# Patient Record
Sex: Male | Born: 1965 | Race: Black or African American | Hispanic: No | Marital: Single | Smoking: Former smoker
Health system: Southern US, Community
[De-identification: ages and names within clinical notes are randomized; demographics above are authoritative.]

## PROBLEM LIST (undated history)

## (undated) DIAGNOSIS — Z87898 Personal history of other specified conditions: Secondary | ICD-10-CM

## (undated) DIAGNOSIS — F1290 Cannabis use, unspecified, uncomplicated: Secondary | ICD-10-CM

## (undated) DIAGNOSIS — F4310 Post-traumatic stress disorder, unspecified: Secondary | ICD-10-CM

## (undated) DIAGNOSIS — N529 Male erectile dysfunction, unspecified: Secondary | ICD-10-CM

## (undated) DIAGNOSIS — F1490 Cocaine use, unspecified, uncomplicated: Secondary | ICD-10-CM

## (undated) DIAGNOSIS — E559 Vitamin D deficiency, unspecified: Secondary | ICD-10-CM

## (undated) DIAGNOSIS — G4700 Insomnia, unspecified: Secondary | ICD-10-CM

## (undated) DIAGNOSIS — J189 Pneumonia, unspecified organism: Secondary | ICD-10-CM

## (undated) DIAGNOSIS — N289 Disorder of kidney and ureter, unspecified: Secondary | ICD-10-CM

## (undated) DIAGNOSIS — I10 Essential (primary) hypertension: Secondary | ICD-10-CM

## (undated) DIAGNOSIS — J45909 Unspecified asthma, uncomplicated: Secondary | ICD-10-CM

## (undated) DIAGNOSIS — G4733 Obstructive sleep apnea (adult) (pediatric): Secondary | ICD-10-CM

## (undated) DIAGNOSIS — F32A Depression, unspecified: Secondary | ICD-10-CM

## (undated) DIAGNOSIS — F1291 Cannabis use, unspecified, in remission: Secondary | ICD-10-CM

## (undated) DIAGNOSIS — I509 Heart failure, unspecified: Secondary | ICD-10-CM

## (undated) DIAGNOSIS — F419 Anxiety disorder, unspecified: Secondary | ICD-10-CM

## (undated) DIAGNOSIS — D649 Anemia, unspecified: Secondary | ICD-10-CM

## (undated) HISTORY — DX: Cannabis use, unspecified, uncomplicated: F12.90

## (undated) HISTORY — DX: Obstructive sleep apnea (adult) (pediatric): G47.33

## (undated) HISTORY — DX: Vitamin D deficiency, unspecified: E55.9

## (undated) HISTORY — DX: Anemia, unspecified: D64.9

## (undated) HISTORY — DX: Cocaine use, unspecified, uncomplicated: F14.90

## (undated) HISTORY — DX: Male erectile dysfunction, unspecified: N52.9

## (undated) HISTORY — DX: Disorder of kidney and ureter, unspecified: N28.9

## (undated) HISTORY — DX: Personal history of other specified conditions: Z87.898

## (undated) HISTORY — PX: OTHER SURGICAL HISTORY: SHX169

## (undated) HISTORY — DX: Cannabis use, unspecified, in remission: F12.91

---

## 2013-08-11 ENCOUNTER — Emergency Department (HOSPITAL_COMMUNITY)
Admission: EM | Admit: 2013-08-11 | Discharge: 2013-08-11 | Disposition: A | Discharge status: Home / Self Care | Payer: BC Managed Care – PPO | Attending: Emergency Medicine | Admitting: Emergency Medicine

## 2013-08-11 ENCOUNTER — Emergency Department (HOSPITAL_COMMUNITY): Payer: BC Managed Care – PPO

## 2013-08-11 ENCOUNTER — Encounter (HOSPITAL_COMMUNITY): Payer: Self-pay | Admitting: Emergency Medicine

## 2013-08-11 DIAGNOSIS — Z79899 Other long term (current) drug therapy: Secondary | ICD-10-CM | POA: Insufficient documentation

## 2013-08-11 DIAGNOSIS — Z8659 Personal history of other mental and behavioral disorders: Secondary | ICD-10-CM | POA: Insufficient documentation

## 2013-08-11 DIAGNOSIS — N289 Disorder of kidney and ureter, unspecified: Secondary | ICD-10-CM | POA: Insufficient documentation

## 2013-08-11 DIAGNOSIS — R0789 Other chest pain: Secondary | ICD-10-CM

## 2013-08-11 DIAGNOSIS — I10 Essential (primary) hypertension: Secondary | ICD-10-CM | POA: Insufficient documentation

## 2013-08-11 HISTORY — DX: Essential (primary) hypertension: I10

## 2013-08-11 HISTORY — DX: Anxiety disorder, unspecified: F41.9

## 2013-08-11 LAB — CBC
HCT: 38.9 % — ABNORMAL LOW (ref 39.0–52.0)
Hemoglobin: 13.3 g/dL (ref 13.0–17.0)
MCH: 29.2 pg (ref 26.0–34.0)
MCHC: 34.2 g/dL (ref 30.0–36.0)
MCV: 85.5 fL (ref 78.0–100.0)
RBC: 4.55 MIL/uL (ref 4.22–5.81)

## 2013-08-11 LAB — BASIC METABOLIC PANEL
BUN: 21 mg/dL (ref 6–23)
CO2: 25 mEq/L (ref 19–32)
Calcium: 8.9 mg/dL (ref 8.4–10.5)
Creatinine, Ser: 1.87 mg/dL — ABNORMAL HIGH (ref 0.50–1.35)
GFR calc non Af Amer: 41 mL/min — ABNORMAL LOW (ref >90)
Glucose, Bld: 113 mg/dL — ABNORMAL HIGH (ref 70–99)
Sodium: 137 mEq/L (ref 135–145)

## 2013-08-11 LAB — POCT I-STAT TROPONIN I

## 2013-08-11 NOTE — ED Provider Notes (Signed)
CSN: [id number]     Arrival date & time 08/11/13  2033 History   First MD Initiated Contact with Patient 08/11/13 2118     Chief Complaint  Patient presents with  . Chest Pain   (Consider location/radiation/quality/duration/timing/severity/associated sxs/prior Treatment) Patient is a 47 y.o. male presenting with chest pain. The history is provided by the patient.  Chest Pain  Patient here complaining of chest pain that began yesterday while at rest. Pain last for approximately one to 2 seconds and is located on the left side of his chest and characterized as sharp. No radiation or associated dyspnea, diaphoresis, nausea, vomiting. No rashes to his chest. No recent fever or cough. No leg pain or swelling. No prior history of same. No symptoms today. He feels that his baseline. Denies any prior history of CAD. Past Medical History  Diagnosis Date  . Hypertension   . Anxiety    History reviewed. No pertinent past surgical history. History reviewed. No pertinent family history. History  Substance Use Topics  . Smoking status: Never Smoker   . Smokeless tobacco: Not on file  . Alcohol Use: No    Review of Systems  Cardiovascular: Positive for chest pain.  All other systems reviewed and are negative.    Allergies  Review of patient's allergies indicates no known allergies.  Home Medications   Current Outpatient Rx  Name  Route  Sig  Dispense  Refill  . atenolol (TENORMIN) 25 MG tablet   Oral   Take by mouth daily.          BP 168/107  Pulse 63  Temp(Src) 98.8 F (37.1 C) (Oral)  Resp 16  SpO2 98% Physical Exam  Nursing note and vitals reviewed. Constitutional: He is oriented to person, place, and time. He appears well-developed and well-nourished.  Non-toxic appearance. No distress.  HENT:  Head: Normocephalic and atraumatic.  Eyes: Conjunctivae, EOM and lids are normal. Pupils are equal, round, and reactive to light.  Neck: Normal range of motion. Neck supple. No  tracheal deviation present. No mass present.  Cardiovascular: Normal rate, regular rhythm and normal heart sounds.  Exam reveals no gallop.   No murmur heard. Pulmonary/Chest: Effort normal and breath sounds normal. No stridor. No respiratory distress. He has no decreased breath sounds. He has no wheezes. He has no rhonchi. He has no rales.  Abdominal: Soft. Normal appearance and bowel sounds are normal. He exhibits no distension. There is no tenderness. There is no rebound and no CVA tenderness.  Musculoskeletal: Normal range of motion. He exhibits no edema and no tenderness.  Neurological: He is alert and oriented to person, place, and time. He has normal strength. No cranial nerve deficit or sensory deficit. GCS eye subscore is 4. GCS verbal subscore is 5. GCS motor subscore is 6.  Skin: Skin is warm and dry. No abrasion and no rash noted.  Psychiatric: He has a normal mood and affect. His speech is normal and behavior is normal.    ED Course  Procedures (including critical care time) Labs Review Labs Reviewed  CBC  BASIC METABOLIC PANEL   Imaging Review No results found.  EKG Interpretation    Date/Time:  Monday August 11 2013 20:47:10 EST Ventricular Rate:  62 PR Interval:  162 QRS Duration: 98 QT Interval:  418 QTC Calculation: 424 R Axis:   45 Text Interpretation:  Sinus rhythm Borderline T wave abnormalities Artifact No old tracing to compare Confirmed by WARD  DO, KRISTEN ([id number]) on 08/11/2013  8:56:43 PM            MDM  No diagnosis found.   Patient with known renal insufficiency. No concern for ACS or pulmonary embolism. Given his atypical symptoms. Patient followup with his Dr.  Leota Jacobsen, MD 08/11/13 [phone]

## 2013-08-11 NOTE — ED Notes (Signed)
Pt complains of chest pain for one day, he describes it as a sharp pain

## 2014-01-18 ENCOUNTER — Emergency Department (INDEPENDENT_AMBULATORY_CARE_PROVIDER_SITE_OTHER)
Admission: EM | Admit: 2014-01-18 | Discharge: 2014-01-18 | Disposition: A | Discharge status: Home / Self Care | Payer: BC Managed Care – PPO | Source: Home / Self Care | Attending: Family Medicine | Admitting: Family Medicine

## 2014-01-18 ENCOUNTER — Encounter (HOSPITAL_COMMUNITY): Payer: Self-pay | Admitting: Emergency Medicine

## 2014-01-18 ENCOUNTER — Emergency Department (INDEPENDENT_AMBULATORY_CARE_PROVIDER_SITE_OTHER): Payer: BC Managed Care – PPO

## 2014-01-18 DIAGNOSIS — M25539 Pain in unspecified wrist: Secondary | ICD-10-CM

## 2014-01-18 DIAGNOSIS — M549 Dorsalgia, unspecified: Secondary | ICD-10-CM

## 2014-01-18 DIAGNOSIS — Y92410 Unspecified street and highway as the place of occurrence of the external cause: Secondary | ICD-10-CM

## 2014-01-18 HISTORY — DX: Unspecified asthma, uncomplicated: J45.909

## 2014-01-18 MED ORDER — TRAMADOL HCL 50 MG PO TABS: 50.0000 mg | ORAL_TABLET | Freq: Four times a day (QID) | ORAL | Status: DC | PRN

## 2014-01-18 NOTE — ED Notes (Signed)
Updated on wait. 

## 2014-01-18 NOTE — Discharge Instructions (Signed)
Thank you for coming in today. Use the splint for 1 week.  Follow up with orthopedics if your wrist hurts after 1 week.  Use tramadol for severe pain.  Come back or go to the emergency room if you notice new weakness new numbness problems walking or bowel or bladder problems.
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
## 2014-05-05 ENCOUNTER — Encounter (HOSPITAL_COMMUNITY): Payer: Self-pay | Admitting: Emergency Medicine

## 2014-05-05 ENCOUNTER — Emergency Department (HOSPITAL_COMMUNITY): Payer: BC Managed Care – PPO

## 2014-05-05 ENCOUNTER — Emergency Department (HOSPITAL_COMMUNITY)
Admission: EM | Admit: 2014-05-05 | Discharge: 2014-05-07 | Disposition: A | Discharge status: Home / Self Care | Payer: BC Managed Care – PPO | Attending: Emergency Medicine | Admitting: Emergency Medicine

## 2014-05-05 DIAGNOSIS — F332 Major depressive disorder, recurrent severe without psychotic features: Secondary | ICD-10-CM

## 2014-05-05 DIAGNOSIS — I10 Essential (primary) hypertension: Secondary | ICD-10-CM | POA: Diagnosis not present

## 2014-05-05 DIAGNOSIS — F329 Major depressive disorder, single episode, unspecified: Secondary | ICD-10-CM

## 2014-05-05 DIAGNOSIS — F419 Anxiety disorder, unspecified: Secondary | ICD-10-CM | POA: Diagnosis present

## 2014-05-05 DIAGNOSIS — F32A Depression, unspecified: Secondary | ICD-10-CM | POA: Diagnosis present

## 2014-05-05 DIAGNOSIS — N189 Chronic kidney disease, unspecified: Secondary | ICD-10-CM

## 2014-05-05 DIAGNOSIS — M25519 Pain in unspecified shoulder: Secondary | ICD-10-CM | POA: Diagnosis present

## 2014-05-05 DIAGNOSIS — F411 Generalized anxiety disorder: Secondary | ICD-10-CM | POA: Diagnosis not present

## 2014-05-05 DIAGNOSIS — F17200 Nicotine dependence, unspecified, uncomplicated: Secondary | ICD-10-CM | POA: Insufficient documentation

## 2014-05-05 DIAGNOSIS — F1410 Cocaine abuse, uncomplicated: Secondary | ICD-10-CM

## 2014-05-05 DIAGNOSIS — R45851 Suicidal ideations: Secondary | ICD-10-CM

## 2014-05-05 DIAGNOSIS — R45850 Homicidal ideations: Secondary | ICD-10-CM

## 2014-05-05 DIAGNOSIS — R7989 Other specified abnormal findings of blood chemistry: Secondary | ICD-10-CM

## 2014-05-05 LAB — COMPREHENSIVE METABOLIC PANEL
ALK PHOS: 112 U/L (ref 39–117)
ALT: 24 U/L (ref 0–53)
AST: 26 U/L (ref 0–37)
Albumin: 4 g/dL (ref 3.5–5.2)
Anion gap: 15 (ref 5–15)
BILIRUBIN TOTAL: 0.7 mg/dL (ref 0.3–1.2)
BUN: 27 mg/dL — ABNORMAL HIGH (ref 6–23)
CALCIUM: 9.4 mg/dL (ref 8.4–10.5)
CO2: 20 mEq/L (ref 19–32)
Chloride: 103 mEq/L (ref 96–112)
Creatinine, Ser: 2.65 mg/dL — ABNORMAL HIGH (ref 0.50–1.35)
GFR calc non Af Amer: 27 mL/min — ABNORMAL LOW (ref >90)
GFR, EST AFRICAN AMERICAN: 31 mL/min — AB (ref >90)
GLUCOSE: 85 mg/dL (ref 70–99)
Potassium: 4.3 mEq/L (ref 3.7–5.3)
Sodium: 138 mEq/L (ref 137–147)
Total Protein: 8.1 g/dL (ref 6.0–8.3)

## 2014-05-05 LAB — CBC WITH DIFFERENTIAL/PLATELET
BASOS ABS: 0 10*3/uL (ref 0.0–0.1)
Basophils Relative: 0 % (ref 0–1)
EOS ABS: 0 10*3/uL (ref 0.0–0.7)
Eosinophils Relative: 0 % (ref 0–5)
HCT: 40.9 % (ref 39.0–52.0)
Hemoglobin: 13.6 g/dL (ref 13.0–17.0)
Lymphocytes Relative: 18 % (ref 12–46)
Lymphs Abs: 1.4 10*3/uL (ref 0.7–4.0)
MCH: 28 pg (ref 26.0–34.0)
MCHC: 33.3 g/dL (ref 30.0–36.0)
MCV: 84.3 fL (ref 78.0–100.0)
MONO ABS: 0.5 10*3/uL (ref 0.1–1.0)
Monocytes Relative: 7 % (ref 3–12)
NEUTROS PCT: 75 % (ref 43–77)
Neutro Abs: 5.8 10*3/uL (ref 1.7–7.7)
Platelets: 274 10*3/uL (ref 150–400)
RBC: 4.85 MIL/uL (ref 4.22–5.81)
RDW: 15.1 % (ref 11.5–15.5)
WBC: 7.7 10*3/uL (ref 4.0–10.5)

## 2014-05-05 LAB — BASIC METABOLIC PANEL
ANION GAP: 18 — AB (ref 5–15)
BUN: 26 mg/dL — ABNORMAL HIGH (ref 6–23)
CHLORIDE: 105 meq/L (ref 96–112)
CO2: 18 mEq/L — ABNORMAL LOW (ref 19–32)
CREATININE: 2.73 mg/dL — AB (ref 0.50–1.35)
Calcium: 9.4 mg/dL (ref 8.4–10.5)
GFR, EST AFRICAN AMERICAN: 30 mL/min — AB (ref >90)
GFR, EST NON AFRICAN AMERICAN: 26 mL/min — AB (ref >90)
Glucose, Bld: 90 mg/dL (ref 70–99)
POTASSIUM: 4.4 meq/L (ref 3.7–5.3)
Sodium: 141 mEq/L (ref 137–147)

## 2014-05-05 LAB — I-STAT TROPONIN, ED: TROPONIN I, POC: 0.01 ng/mL (ref 0.00–0.08)

## 2014-05-05 LAB — ETHANOL: Alcohol, Ethyl (B): <11 mg/dL (ref 0–11)

## 2014-05-05 LAB — RAPID URINE DRUG SCREEN, HOSP PERFORMED
Amphetamines: NOT DETECTED
BENZODIAZEPINES: NOT DETECTED
Barbiturates: NOT DETECTED
Cocaine: POSITIVE — AB
Opiates: NOT DETECTED
Tetrahydrocannabinol: POSITIVE — AB

## 2014-05-05 MED ORDER — MORPHINE SULFATE 4 MG/ML IJ SOLN: 4.0000 mg | Freq: Once | INTRAMUSCULAR | Status: DC

## 2014-05-05 MED ORDER — ONDANSETRON HCL 4 MG/2ML IJ SOLN: 4.0000 mg | Freq: Once | INTRAMUSCULAR | Status: DC

## 2014-05-05 MED ORDER — METOPROLOL TARTRATE 25 MG PO TABS
25.0000 mg | ORAL_TABLET | Freq: Two times a day (BID) | ORAL | Status: DC
  Administered 2014-05-05 – 2014-05-07 (×4): 25 mg via ORAL
  Filled 2014-05-05 (×4): qty 1

## 2014-05-05 MED ORDER — ZIPRASIDONE MESYLATE 20 MG IM SOLR: 20.0000 mg | Freq: Four times a day (QID) | INTRAMUSCULAR | Status: DC | PRN

## 2014-05-05 MED ORDER — HYDROCHLOROTHIAZIDE 25 MG PO TABS
25.0000 mg | ORAL_TABLET | Freq: Every day | ORAL | Status: DC
Start: 2014-05-05 — End: 2014-05-07
  Administered 2014-05-05 – 2014-05-07 (×3): 25 mg via ORAL
  Filled 2014-05-05 (×3): qty 1

## 2014-05-05 MED ORDER — IBUPROFEN 800 MG PO TABS
800.0000 mg | ORAL_TABLET | Freq: Four times a day (QID) | ORAL | Status: DC | PRN
  Administered 2014-05-06 – 2014-05-07 (×2): 800 mg via ORAL
  Filled 2014-05-05 (×2): qty 1

## 2014-05-05 NOTE — ED Notes (Signed)
Patient gone to xray.  Patient not in the room at the moment.

## 2014-05-05 NOTE — Discharge Instructions (Signed)
Your kidney function is abnormal.  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  Follow up with resources provided for help with substance abuse.      Polysubstance Abuse When people abuse more than one drug or type of drug it is called polysubstance or polydrug abuse. For example, many smokers also drink alcohol. This is one form of polydrug abuse. Polydrug abuse also refers to the use of a drug to counteract an unpleasant effect produced by another drug. It may also be used to help with withdrawal from another drug. People who take stimulants may become agitated. Sometimes this agitation is countered with a tranquilizer. This helps protect against the unpleasant side effects. Polydrug abuse also refers to the use of different drugs at the same time.  Anytime drug use is interfering with normal living activities, it has become abuse. This includes problems with family and friends. Psychological dependence has developed when your mind tells you that the drug is needed. This is usually followed by physical dependence which has developed when continuing increases of drug are required to get the same feeling or "high". This is known as addiction or chemical dependency. A person's risk is much higher if there is a history of chemical dependency in the family. SIGNS OF CHEMICAL DEPENDENCY  You have been told by friends or family that drugs have become a problem.  You fight when using drugs.  You are having blackouts (not remembering what you do while using).  You feel sick from using drugs but continue using.  You lie about use or amounts of drugs (chemicals) used.  You need chemicals to get you going.  You are suffering in work performance or in school because of drug use.  You get sick from use of drugs but continue to use anyway.  You need drugs to relate to people or feel comfortable in social  situations.  You use drugs to forget problems. "Yes" answered to any of the above signs of chemical dependency indicates there are problems. The longer the use of drugs continues, the greater the problems will become. If there is a family history of drug or alcohol use, it is best not to experiment with these drugs. Continual use leads to tolerance. After tolerance develops more of the drug is needed to get the same feeling. This is followed by addiction. With addiction, drugs become the most important part of life. It becomes more important to take drugs than participate in the other usual activities of life. This includes relating to friends and family. Addiction is followed by dependency. Dependency is a condition where drugs are now needed not just to get high, but to feel normal. Addiction cannot be cured but it can be stopped. This often requires outside help and the care of professionals. Treatment centers are listed in the yellow pages under: Cocaine, Narcotics, and Alcoholics Anonymous. Most hospitals and clinics can refer you to a specialized care center. Talk to your caregiver if you need help. Document Released: 04/26/2005 Document Revised: 11/27/2011 Document Reviewed: 09/04/2005 [facility] Patient Information 2015 [place], [place]. This information is not intended to replace advice given to you by your health care provider. Make sure you discuss any questions you have with your health care provider.   Acute Kidney Injury Acute kidney injury is a disease in which there is sudden (acute) damage to the kidneys. The kidneys are 2 organs that lie on either side  of the spine between the middle of the back and the front of the abdomen. The kidneys:  Remove wastes and extra water from the blood.   Produce important hormones. These help keep bones strong, regulate blood pressure, and help create red blood cells.   Balance the fluids and chemicals in the blood and tissues. A small amount of  kidney damage may not cause problems, but a large amount of damage may make it difficult or impossible for the kidneys to work the way they should. Acute kidney injury may develop into long-lasting (chronic) kidney disease. It may also develop into a life-threatening disease called end-stage kidney disease. Acute kidney injury can get worse very quickly, so it should be treated right away. Early treatment may prevent other kidney diseases from developing.  CAUSES   A problem with blood flow to the kidneys. This may be caused by:   Blood loss.   Heart disease.   Severe burns.   Liver disease.  Direct damage to the kidneys. This may be caused by:  Some medicines.   A kidney infection.   Poisoning or consuming toxic substances.   A surgical wound.   A blow to the kidney area.   A problem with urine flow. This may be caused by:   Cancer.   Kidney stones.   An enlarged prostate. SYMPTOMS   Swelling (edema) of the legs, ankles, or feet.   Tiredness (lethargy).   Nausea or vomiting.   Confusion.   Problems with urination, such as:   Painful or burning feeling during urination.   Decreased urine production.   Frequent accidents in children who are potty trained.   Bloody urine.   Muscle twitches and cramps.   Shortness of breath.   Seizures.   Chest pain or pressure. Sometimes, no symptoms are present. DIAGNOSIS Acute kidney injury may be detected and diagnosed by tests, including blood, urine, imaging, or kidney biopsy tests.  TREATMENT Treatment of acute kidney injury varies depending on the cause and severity of the kidney damage. In mild cases, no treatment may be needed. The kidneys may heal on their own. If acute kidney injury is more severe, your caregiver will treat the cause of the kidney damage, help the kidneys heal, and prevent complications from occurring. Severe cases may require a procedure to remove toxic wastes from the  body (dialysis) or surgery to repair kidney damage. Surgery may involve:   Repair of a torn kidney.   Removal of an obstruction. Most of the time, you will need to stay overnight at the hospital.  HOME CARE INSTRUCTIONS:  Follow your prescribed diet.  Only take over-the-counter or prescription medicines as directed by your caregiver.  Do not take any new medicines (prescription, over-the-counter, or nutritional supplements) unless approved by your caregiver. Many medicines can worsen your kidney damage or need to have the dose adjusted.   Keep all follow-up appointments as directed by your caregiver.  Observe your condition to make sure you are healing as expected. SEEK IMMEDIATE MEDICAL CARE IF:  You are feeling ill or have severe pain in the back or side.   Your symptoms return or you have new symptoms.  You have any symptoms of end-stage kidney disease. These include:   Persistent itchiness.   Loss of appetite.   Headaches.   Abnormally dark or light skin.  Numbness in the hands or feet.   Easy bruising.   Frequent hiccups.   Menstruation stops.   You have a fever.  You have increased urine production.  You have pain or bleeding when urinating. MAKE SURE YOU:   Understand these instructions.  Will watch your condition.  Will get help right away if you are not doing well or get worse Document Released: 03/20/2011 Document Revised: 12/30/2012 Document Reviewed: 05/03/2012 [facility] Patient Information 2015 [place], [place]. This information is not intended to replace advice given to you by your health care provider. Make sure you discuss any questions you have with your health care provider.    Emergency Department Resource Guide 1) Find a Doctor and Pay Out of Pocket Although you won't have to find out who is covered by your insurance plan, it is a good idea to ask around and get recommendations. You will then need to call the office and see if  the doctor you have chosen will accept you as a new patient and what types of options they offer for patients who are self-pay. Some doctors offer discounts or will set up payment plans for their patients who do not have insurance, but you will need to ask so you aren't surprised when you get to your appointment.  2) Contact Your Local Health Department Not all health departments have doctors that can see patients for sick visits, but many do, so it is worth a call to see if yours does. If you don't know where your local health department is, you can check in your phone book. The CDC also has a tool to help you locate your state's health department, and many state websites also have listings of all of their local health departments.  3) Find a [facility] If your illness is not likely to be very severe or complicated, you may want to try a walk in clinic. These are popping up all over the country in pharmacies, drugstores, and shopping centers. They're usually staffed by nurse practitioners or physician assistants that have been trained to treat common illnesses and complaints. They're usually fairly quick and inexpensive. However, if you have serious medical issues or chronic medical problems, these are probably not your best option.  No Primary Care Doctor: - Call Health Connect at  [phone] - they can help you locate a primary care doctor that  accepts your insurance, provides certain services, etc. - Physician Referral Service- [phone]  Chronic Pain Problems: Organization         Address  Phone   Notes  [facility]  [phone] Patients need to be referred by their primary care doctor.   Medication Assistance: Organization         Address  Phone   Notes  [facility] Medication [facility] [address] [place], [place] [address] [phone] --Must be a resident of [facility] -- Must have NO insurance coverage whatsoever (no  Medicaid/ Medicare, etc.) -- The pt. MUST have a primary care doctor that directs their care regularly and follows them in the community   Med[facility]ist  [phone]   [employer]  [phone]    Agencies that provide inexpensive medical care: Organization         Address  Phone   Notes  [place]  [phone]   [facility]    [phone]   [facility] [place], [place] [address] [phone]   [place] [address], [place] [phone]   Planned Parenthood    [phone])  [facility]    [phone]   [place] [address], [place] Phone:  [phone], Fax:  [phone] Hours of Operation:  9 am - 6 pm, M-F.  Also accepts Medicaid/Medicare and self-pay.  [facility] for [place], [address], [place] Phone: [phone], Fax: [phone]. Hours of Operation:  8:30 am - 5:30 pm, M-F.  Also accepts Medicaid and self-pay.  [facility] [place] [address] [place] Phone: [phone]   [place], [place], [place] [phone], Ext. [phone] Mondays & Thursdays: 7-9 AM.  First 15 patients are seen on a first come, first serve basis.    [place] Providers:  Organization         Address  Phone   Notes  [facility] [address], [place] [phone] Also accepts self-pay patients.  [facility] [id number] [place], [place]  [phone]   [place], [address], [place] [phone]   [facility] [address] [place] [phone]   Lucianne Lei [address], [place]   [phone] Only accepts [place] Access [place] patients after they have their name applied to their card.    Self-Pay (no insurance) in [facility]:  Organization         Address  Phone   Notes  Sickle Cell Patients, [facility] [phone]   [facility] Urgent Care [place] [phone]   [facility] [place]  [place], [place], [place] [phone]   [facility]/Dr. Osei-Bonsu  [address], [place] or [address], [place] [phone] Phone number for both [place] and [place] locations is the same.  [facility]gent [facility] and [facility] [address], [place] [phone]   [facility] [address], [place] or [address] [phone]   [facility] [address] [place] [phone], phone; [phone], fax Sees patients 1st and 3rd Saturday of every month.  Must not qualify for public or private insurance (i.e. Medicaid, Medicare, [place] Health Choice, Veterans' Benefits)  Household income should be no more than 200% of the poverty level The clinic cannot treat you if you are pregnant or think you are pregnant  Sexually transmitted diseases are not treated at the clinic.    Dental Care: Organization         Address  Phone  Notes  [facility] of [facility] [place] [phone] Accepts children up to age 41 who are enrolled in [place] or [place]; pregnant women with a Medicaid card; and children who have applied for Medicaid or [place] Health Choice, but were declined, whose parents can pay a reduced fee at time of service.  [facility] Department of [facility]  [address], [place] [phone] Accepts children up to age 61 who are enrolled in [place] or [place]; pregnant women with a Medicaid card; and children who have applied for Medicaid or [place],  but were declined, whose parents can pay a reduced  fee at time of service.  [place] Adult Dental Access PROGRAM  [place] [phone] Patients are seen by appointment only. Walk-ins are not accepted. [place] will see patients 67 years of age and older. Monday - Tuesday (8am-5pm) Most Wednesdays (8:30-5pm) $30 per visit, cash only  [facility] Adult Dental Access PROGRAM  [address], [facility] [phone] Patients are seen by appointment only. Walk-ins are not accepted. [place] will see patients 78 years of age and older. One Wednesday Evening (Monthly: Volunteer Based).  $30 per visit, cash only  [place]  [phone] for adults; Children under age 17, call Graduate Pediatric Dentistry at [phone]. Children aged 76-14, please call [phone] to request a pediatric application.  Dental services are provided in all areas of dental care including fillings, crowns and bridges, complete and partial dentures, implants, gum treatment, root canals, and extractions. Preventive care is also provided. Treatment is provided to both adults and children. Patients are selected via a lottery and there is often a waiting list.   [facility] [address] [place]  [phone] [email or web address]   [facility] [address] [place], [place] [phone], Ext. [phone] Second and Fourth Thursday of each month, opens at 6:30 AM; Clinic ends at 9 AM.  Patients are seen on a first-come first-served basis, and a limited number are seen during each clinic.   [facility]  [address] [place], [place] [phone]   Eligibility Requirements You must have lived in [place], [place], or [place] counties for at least the last three months.   You cannot be eligible for state or federal sponsored [employer], including [employer], [place], or [employer].   You generally  cannot be eligible for healthcare insurance through your employer.    How to apply: Eligibility screenings are held every Tuesday and Wednesday afternoon from 1:00 pm until 4:00 pm. You do not need an appointment for the interview!  [facility] [address], [place], [place]   [place]  [place] Department  [place]  [phone]    Behavioral Health Resources in the Community: Intensive Outpatient Programs Organization         Address  Phone  Notes  [place] [employer] [address] [place], [place] [phone]   [facility] Outpatient [address], [place], [place]   ADS: Alcohol & Drug Svcs [address] [place], [place]   [place] [address]  [place], [place] or [phone]   Substance Abuse Resources Organization         Address  Phone  Notes  Alcohol and Drug Services  [phone]   [place]  [phone]   [facility] [place]   Chinita Pester  [phone]   Residential & Outpatient Substance Abuse Program  [phone]   Psychological Services Organization         Address  Phone  Notes  [facility] [place]  [place]  [phone]   [place] [address], [place] or [phone]    Mobile Crisis Teams Organization         Address  Phone  Notes  [facility], Mobile Crisis Care Unit  [phone]  [facility]  [address] [place], [place]   [name] [address], [place] [phone]    Self-Help/Support Groups Organization         Address  Phone             Notes  [facility] [place] - variety of support groups  [place] Call for more  information  Narcotics Anonymous (NA), [facility] [address], [employer] [place]  2 meetings at this location   Special educational needs teacher         Address  Phone  Notes  [facility]idential Treatment [place],    [place]  [phone]   [facility]  [address], [place] [id number], [place], [place]   [place], [place] [phone] Admissions: 8am-3pm M-F  Incentives Substance [place] [address],    [place], [place] [phone]   [facility] [address] [place], [place], [place]   [facility] [address]  [place], [place]   [facility] - Intensive Outpatient [address] Krist[address], [place], [place]   [facility].,  [place], [place] [phone] or [phone]   [facility] ([facility]) [address], [place], [place] Accepts Medicaid  [place] [address],  [place] [phone] Substance Abuse/Addiction Treatment   [facility] Organization         Address  Phone  Notes  [facility]  [phone]   Domenic Schwab, PhD [address] Arlis Porta [place], [place]   [phone] or [phone]   [place], [place] [phone]   [facility] [address], [place], [place] [phone] Insurance/Medicaid/sponsorship through [facility] and Families [address] [place]                                    [place], [place] [phone] [place] [address], [place] [phone]    Dr. Adele Schilder  [phone]   [facility] Dept. 1) [address] [place] 2) [place] 3)  [address], [place] [phone]  [phone]   [place] [phone] or [phone] (After Hours)

## 2014-05-05 NOTE — ED Notes (Signed)
Upon attempting to discharge patient, patient states he does not feel safe leaving.  When asked why he states he thinks he wants to kill himself, patient does not have a specific plan and did not verbalize suicidal ideation when asked before.

## 2014-05-05 NOTE — ED Notes (Signed)
[facility] has arrived to transport pt to [facility]

## 2014-05-05 NOTE — ED Notes (Signed)
[facility] called to transport

## 2014-05-05 NOTE — ED Provider Notes (Signed)
CSN: [id number]     Arrival date & time 05/05/14  0840 History   First MD Initiated Contact with Patient 05/05/14 [phone]     Chief Complaint  Patient presents with  . Shoulder Pain  . Drug Problem    cocaine use last night     (Consider location/radiation/quality/duration/timing/severity/associated sxs/prior Treatment) Patient is a 48 y.o. male presenting with drug problem.  Drug Problem Associated symptoms include arthralgias. Pertinent negatives include no abdominal pain, chest pain, fever, nausea, neck pain, rash, sore throat, vomiting or weakness.   Gabriel Ashley is a 48 year old male with past medical history of hypertension, anxiety, asthma who presents this morning requesting help with cocaine abuse. Patient states he has been sober for the past 2 years, and last night began using cocaine and alcohol. He is unable to quantify the amount of cocaine he has used and states he drank 2 40s of beer last night. According to EMS who brought patient to the ER, patient was experiencing some chest pain which radiated to his back. During my interview with patient this morning he denies having chest pain at any point. He states he only wants "detox for cocaine" and requests a x-ray of his right shoulder. Patient states he hurt his shoulder last week, however refused to provide details as to how he injured it, where it hurts, or any description of his pain.  Past Medical History  Diagnosis Date  . Hypertension   . Anxiety   . Asthma    History reviewed. No pertinent past surgical history. No family history on file. History  Substance Use Topics  . Smoking status: Current Every Day Smoker -- 1.00 packs/day for 31 years  . Smokeless tobacco: Not on file  . Alcohol Use: Yes     Comment: 2 40 oz beers last night    Review of Systems  Constitutional: Negative for fever.  HENT: Negative for sore throat.   Eyes: Negative for visual disturbance.  Respiratory: Negative for chest tightness and  shortness of breath.   Cardiovascular: Negative for chest pain and palpitations.  Gastrointestinal: Negative for nausea, vomiting, abdominal pain and diarrhea.  Genitourinary: Negative for dysuria.  Musculoskeletal: Positive for arthralgias. Negative for neck pain and neck stiffness.  Skin: Negative for pallor and rash.  Neurological: Negative for dizziness, syncope and weakness.  Psychiatric/Behavioral: Negative.       Allergies  Review of patient's allergies indicates no known allergies.  Home Medications   Prior to Admission medications   Medication Sig Start Date End Date Taking? Authorizing Provider  atenolol (TENORMIN) 25 MG tablet Take by mouth daily.    Historical Provider, MD  hydrochlorothiazide (HYDRODIURIL) 25 MG tablet Take 25 mg by mouth daily.    Historical Provider, MD  metoprolol tartrate (LOPRESSOR) 25 MG tablet Take 25 mg by mouth 2 (two) times daily.    Historical Provider, MD  traMADol (ULTRAM) 50 MG tablet Take 1 tablet (50 mg total) by mouth every 6 (six) hours as needed. 01/18/14   Gregor Hams, MD   BP 145/80  Pulse 70  Temp(Src) 99 F (37.2 C) (Oral)  Resp 20  Ht 6' (1.829 m)  Wt 235 lb (106.595 kg)  BMI 31.86 kg/m2  SpO2 98% Physical Exam  Constitutional: He is oriented to person, place, and time. He appears well-developed and well-nourished. No distress.  Patient initially asleep on stretcher. Patient has to be woken up for interview and physical exam. Patient easily arousable and alert and oriented x4 when  aroused  HENT:  Head: Normocephalic and atraumatic.  Eyes: Pupils are equal, round, and reactive to light. No scleral icterus.  Neck: Normal range of motion.  Cardiovascular: Normal rate, regular rhythm and normal heart sounds.   No murmur heard. Pulmonary/Chest: Effort normal and breath sounds normal. No respiratory distress.  Abdominal: Soft. There is no tenderness.  Musculoskeletal: Normal range of motion.  Neurological: He is alert and  oriented to person, place, and time.  Skin: Skin is warm and dry. He is not diaphoretic.    ED Course  Procedures (including critical care time) Labs Review Labs Reviewed  BASIC METABOLIC PANEL - Abnormal; Notable for the following:    CO2 18 (*)    BUN 26 (*)    Creatinine, Ser 2.73 (*)    GFR calc non Af Amer 26 (*)    GFR calc Af Amer 30 (*)    Anion gap 18 (*)    All other components within normal limits  URINE RAPID DRUG SCREEN (HOSP PERFORMED) - Abnormal; Notable for the following:    Cocaine POSITIVE (*)    Tetrahydrocannabinol POSITIVE (*)    All other components within normal limits  COMPREHENSIVE METABOLIC PANEL - Abnormal; Notable for the following:    BUN 27 (*)    Creatinine, Ser 2.65 (*)    GFR calc non Af Amer 27 (*)    GFR calc Af Amer 31 (*)    All other components within normal limits  CBC WITH DIFFERENTIAL  ETHANOL  I-STAT TROPOININ, ED    Imaging Review Dg Shoulder Right  05/05/2014   CLINICAL DATA:  Pain post trauma  EXAM: RIGHT SHOULDER - 2+ VIEW  COMPARISON:  None.  FINDINGS: Frontal, Y scapular, and axillary images were obtained. There is no acute fracture or dislocation. There is subtle erosive change in the acromioclavicular joint. There is slight narrowing of the glenohumeral joint. No intra-articular calcification.  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 Mild narrowing of the glenohumeral joint. No fracture or dislocation.   Electronically Signed   By: Lowella Grip M.D.   On: 05/05/2014 10:06     EKG Interpretation None      MDM   Final diagnoses:  Cocaine abuse  Azotemia  Chronic renal insufficiency, unspecified stage    48 year old male with history of drug abuse and recently sober for the last 2 years presenting after relapse using alcohol and cocaine last night. Patient requesting help with his substance abuse. No suicidal, homicidal ideations. Patient with depressed mood, affect appropriate to  mood. Speech normal, behavioral normal, thought content appropriate. Patient denying any complaints in the ER and is sleeping comfortably on stretcher. Workup initially to include EKG twelve-lead, troponin, CBC, BMP.  Twelve-lead EKG shows sinus rhythm with voltage criteria met for LVH. No acute injury or ectopy noted. Negative troponin.  12:00 PM: Education officer, museum spoke extensively with patient regarding his cocaine abuse and his options to receive help regarding his substance abuse problem. Since patient does not exhibit any psychiatric manifestations that would require him to be admitted inpatient, we'll discharge patient this time and have him followup as outpatient for psychiatric help. Patient's BMP returned having an abnormal BUN/creatinine. Patient's creatinine was elevated approximately 8 months ago, however there have been no values in the meantime 2 showing trends. At this point we suspect patient's cocaine use has affected his kidney function. We strongly recommend patient to stop using cocaine and to follow up with primary care after his  visit today to follow his abnormal renal function.    Signed,  Dahlia Bailiff, PA-C 6:07 PM   This patient seen and discussed with Dr. Tanna Furry, M.D.   After the patient was discharged, he states he is now having suicidal ideation, and would like a psychiatric consult while in the ED. Psych consult placed and patient placed on psych hold.  Carrie Mew, PA-C 05/05/14 [phone]

## 2014-05-05 NOTE — Clinical Social Work Note (Signed)
Clinical Education officer, museum met with patient at bedside, per PA request, due to patient request for "cocaine detox/treatment options."  CSW spoke with patient at bedside who states that the lives with family and friends in the [place] area and has been sober from alcohol and cocaine for the past 2 years.  Patient does not further explain his most recent use prior to hospitalization but does state that this was his first use in the last 2 years.    Patient was hopeful for inpatient treatment admission directly from hospital setting.  CSW explained that patient will have to complete facility evaluation and likely go on a waiting list for facility placement prior to admission for residential treatment.  CSW provided patient with residential treatment resources, intensive outpatient resources, and local community resources.  CSW offered patient room telephone to initiate conversation with facilities and arrange times to meet for evaluation process - patient stated he will do it from home.  Patient with limited reception to facility list and continued to reiterate his desire for placement directly from ED.  CSW has updated PA regarding resources provided and patient initiative for treatment.  Patient to be discharge home today.  Clinical Social Worker will sign off for now as social work intervention is no longer needed. Please consult [place] again if new need arises.  Barbette Or, [place]

## 2014-05-05 NOTE — BH Assessment (Signed)
Tele Assessment Note   Gabriel Ashley is an 48 y.o. male that presented to [facility] seeking residential services for his cocaine use. Patient reports using cocaine 1x in the past 2 yrs and his 1x use was yesterday.  No alcohol use reported. LCSW met with patient and explained to patient options available. Patient was placed up for discharge and upon discharging patient, patient states he does not feel safe leaving. Patient reported suicidal thoughts with a plan to run into traffic. Sts that his suicidal thoughts started today while he was in the Emergency Department. Patient does not identify any specific triggers. Per ED notes,  "When patient was asked why he has suicidal thoughts he states b/c he thinks he wants to kill himself, patient does not have a specific plan and did not verbalize suicidal ideation when asked before". Patient denies previous hx of suicidal attempts/gestures. He denies hx of self mutilating behaviors. He denies HI and AVH's. Patient sts that he has a psychiatrist and therapist with the [facility]. Patient hospitalized multiple x's in the past at the [place] [facility] out of state.       Axis I: Depressive Disorder NOS Axis II: Deferred Axis III:  Past Medical History  Diagnosis Date  . Hypertension   . Anxiety   . Asthma    Axis IV: other psychosocial or environmental problems, problems related to social environment, problems with access to health care services and problems with primary support group Axis V: 31-40 impairment in reality testing  Past Medical History:  Past Medical History  Diagnosis Date  . Hypertension   . Anxiety   . Asthma     History reviewed. No pertinent past surgical history.  Family History: No family history on file.  Social History:  reports that he has been smoking.  He does not have any smokeless tobacco history on file. He reports that he drinks alcohol. He reports that he uses illicit drugs (Cocaine  and Marijuana).  Additional Social History:  Substance #1 Name of Substance 1: Cocaine  1 - Age of First Use: "I can't say for sure" 1 - Amount (size/oz): "I don't know" 1 - Frequency: "I don't use that frequently" 1 - Duration: "I've used it once in the past 2 yrs" 1 - Last Use / Amount: 05/04/2014  CIWA: CIWA-Ar BP: 152/104 mmHg Pulse Rate: 76 COWS:    PATIENT STRENGTHS: (choose at least two)   Allergies: No Known Allergies  Home Medications:  (Not in a hospital admission)  OB/GYN Status:  No LMP for male patient.  General Assessment Data Location of Assessment: [facility] Is this a Tele or Face-to-Face Assessment?: Tele Assessment Is this an Initial Assessment or a Re-assessment for this encounter?: Initial Assessment Living Arrangements: Other (Comment);Spouse/significant other ("I live with my girlfriend") Can pt return to current living arrangement?: No ("No because we are going through some issues") Admission Status: Involuntary Is patient capable of signing voluntary admission?: Yes Transfer from: [facility] Referral Source: Other     [facility] Crisis Care Plan Living Arrangements: Other (Comment);Spouse/significant other ("I live with my girlfriend") Name of Psychiatrist:  ([place]) Name of Therapist:  ([place])  Education Status Is patient currently in school?: No  Risk to self with the past 6 months Suicidal Ideation: Yes-Currently Present (patient's suicidal thoughts started today) Suicidal Intent: Yes-Currently Present Is patient at risk for suicide?: No Suicidal Plan?: Yes-Currently Present Specify Current Suicidal Plan:  (run in  front of a bus) Access to Means: Yes Specify Access to Suicidal Means:  (access to the city bus and ability to run) Previous Attempts/Gestures: No How many times?:  (0) Other Self Harm Risks:  (none reported ) Triggers for Past Attempts: Other (Comment) (no previous attempts or  gestures) Intentional Self Injurious Behavior: None Family Suicide History: No Recent stressful life event(s): Other (Comment) ("I can't say specifically") Persecutory voices/beliefs?: No Depression: Yes Depression Symptoms: Feeling angry/irritable;Feeling worthless/self pity;Loss of interest in usual pleasures;Fatigue;Isolating;Tearfulness;Insomnia;Despondent Substance abuse history and/or treatment for substance abuse?: No Suicide prevention information given to non-admitted patients: Not applicable  Risk to Others within the past 6 months Homicidal Ideation: No Thoughts of Harm to Others: No Current Homicidal Intent: No Current Homicidal Plan: No Access to Homicidal Means: No Identified Victim:  (n/a) History of harm to others?: No Assessment of Violence: None Noted Violent Behavior Description:  (patient calm and cooperative ) Does patient have access to weapons?: No Criminal Charges Pending?: Yes Describe Pending Criminal Charges:  ("It's all domestic with girlfriend") Does patient have a court date: Yes Court Date:  (May 18, 2014 (domestic violence))  Psychosis Hallucinations: None noted Delusions: None noted  Mental Status Report Appear/Hygiene: In hospital gown Eye Contact: Good Motor Activity: Freedom of movement Speech: Logical/coherent Level of Consciousness: Alert Mood: Depressed Affect: Appropriate to circumstance Anxiety Level: Moderate Thought Processes: Coherent;Relevant Judgement: Impaired Orientation: Person;Place;Time;Situation Obsessive Compulsive Thoughts/Behaviors: None  Cognitive Functioning Concentration: Normal Memory: Recent Intact;Remote Intact IQ: Average Insight: Fair Impulse Control: Fair Appetite: Good Weight Loss:  (none reported ) Weight Gain:  (none reported ) Sleep:  ("I don't sleep well due to my sleep apnea") Total Hours of Sleep:  (varies ) Vegetative Symptoms: None  ADLScreening [facility] Assessment Services) Patient's  cognitive ability adequate to safely complete daily activities?: Yes Patient able to express need for assistance with ADLs?: Yes Independently performs ADLs?: Yes (appropriate for developmental age)  Prior Inpatient Therapy Prior Inpatient Therapy: Yes Prior Therapy Dates:  Publishing copy- "It's been a few of them" ) Prior Therapy Facilty/Provider(s):  [facility]) Reason for Treatment:  (depression and suicidal thoughts )  Prior Outpatient Therapy Prior Outpatient Therapy: Yes Prior Therapy Dates:  ([name] ) Prior [facility] [place]) Reason for Treatment:  (depression and suicidal thoughts)  ADL Screening (condition at time of admission) Patient's cognitive ability adequate to safely complete daily activities?: Yes Is the patient deaf or have difficulty hearing?: No Does the patient have difficulty seeing, even when wearing glasses/contacts?: No Does the patient have difficulty concentrating, remembering, or making decisions?: No Patient able to express need for assistance with ADLs?: Yes Does the patient have difficulty dressing or bathing?: No Independently performs ADLs?: Yes (appropriate for developmental age) Does the patient have difficulty walking or climbing stairs?: Yes Weakness of Legs: None Weakness of Arms/Hands: None  Home Assistive Devices/Equipment Home Assistive Devices/Equipment: None    Abuse/Neglect Assessment (Assessment to be complete while patient is alone) Physical Abuse: Denies Verbal Abuse: Denies Sexual Abuse: Denies Exploitation of patient/patient's resources: Denies Self-Neglect: Denies Values / Beliefs Cultural Requests During Hospitalization: None Spiritual Requests During Hospitalization: None   Advance Directives (For Healthcare) Does patient have an advance directive?: No Nutrition Screen- [facility] Patient's home diet: Regular  Additional  Information 1:1 In Past 12 Months?: No CIRT Risk: No Elopement Risk: No Does patient have medical clearance?: Yes     Disposition:  Disposition Initial Assessment Completed for this Encounter: Yes  Waldon Merl [facility] 05/05/2014 2:43 PM

## 2014-05-05 NOTE — ED Notes (Signed)
Pt has left with [facility] with his belongings to go to [facility]

## 2014-05-05 NOTE — ED Notes (Signed)
Assessment team contacted to let them know that telepsych machine is set up at bedside

## 2014-05-05 NOTE — ED Notes (Signed)
TTS machine placed at bedside.   

## 2014-05-05 NOTE — ED Provider Notes (Signed)
Patient was discussed with PA. Agree with his assessment. Agree with EKG interpretation. He shows no signs of myocardial injury.  In fact he completely denies chest pain his entire ER stay. He is sleeping intermittently . He seems quite relaxed. He is frequently asking for food and drinks.  And discussed with patient by PA, case manager, and myself at there is not "cocaine detox center" that he can be taken to.  As the patient was being discharged he told the nursing staff "I do not feel safe at home". I feel like I might hurt myself".  I reevaluated him. My impression is that this is very likely manipulative behavior. I told him in no uncertain terms that this would not lead to him going to "a "cocaine detox center"that does not exist. However, I have asked for ACT evaluation. An IVC is  written.  Tanna Furry, MD 05/05/14 [phone]

## 2014-05-05 NOTE — ED Notes (Signed)
Patient transported to X-ray 

## 2014-05-05 NOTE — ED Notes (Addendum)
Per EMS: Smoked a lot of crack/Cocaine use last night, right shoulder pain, mid back pain, BP: 180/130, down to 150/110 after 3 ntg.  6/10 pain, decreased to a 1/10, described as dull pain.   324 ASA & 2 nitroglycerin SL given en route.  Patient was also arrested a few days ago and in handcuffs when he fell on his right shoulder, this is the same shoulder that is hurting him.   Per patient: patient states that the right shoulder pain was hurting before the cocaine use, denies any new pain symptoms

## 2014-05-05 NOTE — ED Notes (Signed)
Patient drowsy, has been asleep throughout the evening. Denies HI, AVH. Endorses SI. Denies anxiety at present, rates feelings of depression 9/10.   Encouragement offered.   Q 15 safety checks continue.

## 2014-05-05 NOTE — ED Provider Notes (Signed)
  Physical Exam  BP 163/91  Pulse 73  Temp(Src) 98 F (36.7 C) (Oral)  Resp 18  Ht 6' (1.829 m)  Wt 235 lb (106.595 kg)  BMI 31.86 kg/m2  SpO2 100%  Physical Exam  ED Course  Procedures  Patient has bed at [facility]. Will transfer to [facility]. IVC by previous provider. Dr. Tamera Punt will accept.     Wandra Arthurs, MD 05/05/14 [phone]

## 2014-05-06 NOTE — Progress Notes (Signed)
Pt was given the [employer] information. Pt wanted the light on so he could read it. Pt encouraged to read material and ask questions if he had any. Pt alert and inquisitive.

## 2014-05-06 NOTE — ED Notes (Signed)
Patient sleeping. Reports SI and HI toward his girlfriend. Rates anxiety 6/10, depression 8/10. Contracts for safety on the unit.   Encouragement offered. Given Lopressor.   Q 15 safety checks continue.
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
## 2014-05-07 ENCOUNTER — Inpatient Hospital Stay (HOSPITAL_COMMUNITY)
Admission: AD | Admit: 2014-05-07 | Discharge: 2014-05-13 | DRG: 885 | Disposition: A | Discharge status: Home / Self Care | Payer: BC Managed Care – PPO | Source: Intra-hospital | Attending: Psychiatry | Admitting: Psychiatry

## 2014-05-07 ENCOUNTER — Encounter (HOSPITAL_COMMUNITY): Payer: Self-pay | Admitting: *Deleted

## 2014-05-07 ENCOUNTER — Encounter (HOSPITAL_COMMUNITY): Payer: Self-pay | Admitting: Registered Nurse

## 2014-05-07 DIAGNOSIS — F411 Generalized anxiety disorder: Secondary | ICD-10-CM | POA: Diagnosis present

## 2014-05-07 DIAGNOSIS — Z5987 Material hardship due to limited financial resources, not elsewhere classified: Secondary | ICD-10-CM

## 2014-05-07 DIAGNOSIS — R45850 Homicidal ideations: Secondary | ICD-10-CM

## 2014-05-07 DIAGNOSIS — F3289 Other specified depressive episodes: Secondary | ICD-10-CM

## 2014-05-07 DIAGNOSIS — F419 Anxiety disorder, unspecified: Secondary | ICD-10-CM

## 2014-05-07 DIAGNOSIS — R45851 Suicidal ideations: Secondary | ICD-10-CM | POA: Diagnosis not present

## 2014-05-07 DIAGNOSIS — I129 Hypertensive chronic kidney disease with stage 1 through stage 4 chronic kidney disease, or unspecified chronic kidney disease: Secondary | ICD-10-CM | POA: Diagnosis present

## 2014-05-07 DIAGNOSIS — F17200 Nicotine dependence, unspecified, uncomplicated: Secondary | ICD-10-CM | POA: Diagnosis present

## 2014-05-07 DIAGNOSIS — F1424 Cocaine dependence with cocaine-induced mood disorder: Secondary | ICD-10-CM

## 2014-05-07 DIAGNOSIS — Z5989 Other problems related to housing and economic circumstances: Secondary | ICD-10-CM | POA: Diagnosis not present

## 2014-05-07 DIAGNOSIS — Z8051 Family history of malignant neoplasm of kidney: Secondary | ICD-10-CM | POA: Diagnosis not present

## 2014-05-07 DIAGNOSIS — F329 Major depressive disorder, single episode, unspecified: Secondary | ICD-10-CM | POA: Diagnosis present

## 2014-05-07 DIAGNOSIS — J45909 Unspecified asthma, uncomplicated: Secondary | ICD-10-CM | POA: Diagnosis present

## 2014-05-07 DIAGNOSIS — Z598 Other problems related to housing and economic circumstances: Secondary | ICD-10-CM | POA: Diagnosis not present

## 2014-05-07 DIAGNOSIS — F1994 Other psychoactive substance use, unspecified with psychoactive substance-induced mood disorder: Secondary | ICD-10-CM

## 2014-05-07 DIAGNOSIS — F332 Major depressive disorder, recurrent severe without psychotic features: Principal | ICD-10-CM | POA: Diagnosis present

## 2014-05-07 DIAGNOSIS — N184 Chronic kidney disease, stage 4 (severe): Secondary | ICD-10-CM | POA: Diagnosis present

## 2014-05-07 DIAGNOSIS — F410 Panic disorder [episodic paroxysmal anxiety] without agoraphobia: Secondary | ICD-10-CM | POA: Diagnosis present

## 2014-05-07 DIAGNOSIS — F1910 Other psychoactive substance abuse, uncomplicated: Secondary | ICD-10-CM

## 2014-05-07 DIAGNOSIS — F4310 Post-traumatic stress disorder, unspecified: Secondary | ICD-10-CM | POA: Diagnosis present

## 2014-05-07 DIAGNOSIS — R7989 Other specified abnormal findings of blood chemistry: Secondary | ICD-10-CM

## 2014-05-07 DIAGNOSIS — G4700 Insomnia, unspecified: Secondary | ICD-10-CM | POA: Diagnosis present

## 2014-05-07 DIAGNOSIS — F32A Depression, unspecified: Secondary | ICD-10-CM | POA: Diagnosis present

## 2014-05-07 DIAGNOSIS — F1420 Cocaine dependence, uncomplicated: Secondary | ICD-10-CM | POA: Diagnosis present

## 2014-05-07 DIAGNOSIS — I10 Essential (primary) hypertension: Secondary | ICD-10-CM | POA: Diagnosis present

## 2014-05-07 MED ORDER — IBUPROFEN 800 MG PO TABS
800.0000 mg | ORAL_TABLET | Freq: Four times a day (QID) | ORAL | Status: DC | PRN
  Administered 2014-05-08: 800 mg via ORAL
  Filled 2014-05-07 (×2): qty 1

## 2014-05-07 MED ORDER — MAGNESIUM HYDROXIDE 400 MG/5ML PO SUSP: 30.0000 mL | Freq: Every day | ORAL | Status: DC | PRN

## 2014-05-07 MED ORDER — HYDROCHLOROTHIAZIDE 25 MG PO TABS
25.0000 mg | ORAL_TABLET | Freq: Every day | ORAL | Status: DC
  Administered 2014-05-08: 25 mg via ORAL
  Filled 2014-05-07 (×4): qty 1

## 2014-05-07 MED ORDER — ALBUTEROL SULFATE HFA 108 (90 BASE) MCG/ACT IN AERS
1.0000 | INHALATION_SPRAY | RESPIRATORY_TRACT | Status: DC | PRN
  Filled 2014-05-07: qty 6.7

## 2014-05-07 MED ORDER — HYDROXYZINE HCL 50 MG PO TABS
50.0000 mg | ORAL_TABLET | Freq: Three times a day (TID) | ORAL | Status: DC | PRN
  Administered 2014-05-10: 50 mg via ORAL
  Filled 2014-05-07 (×3): qty 1
  Filled 2014-05-07: qty 10

## 2014-05-07 MED ORDER — ALUM & MAG HYDROXIDE-SIMETH 200-200-20 MG/5ML PO SUSP: 30.0000 mL | ORAL | Status: DC | PRN

## 2014-05-07 MED ORDER — FLUOXETINE HCL 20 MG PO CAPS
20.0000 mg | ORAL_CAPSULE | Freq: Every day | ORAL | Status: DC
  Filled 2014-05-07 (×3): qty 1

## 2014-05-07 MED ORDER — FLUOXETINE HCL 20 MG PO CAPS
20.0000 mg | ORAL_CAPSULE | Freq: Every day | ORAL | Status: DC
  Administered 2014-05-07: 20 mg via ORAL
  Filled 2014-05-07 (×2): qty 1

## 2014-05-07 MED ORDER — ACETAMINOPHEN 325 MG PO TABS
650.0000 mg | ORAL_TABLET | Freq: Four times a day (QID) | ORAL | Status: DC | PRN
  Administered 2014-05-09 – 2014-05-12 (×8): 650 mg via ORAL
  Filled 2014-05-07 (×8): qty 2

## 2014-05-07 MED ORDER — HYDROXYZINE HCL 25 MG PO TABS
50.0000 mg | ORAL_TABLET | Freq: Three times a day (TID) | ORAL | Status: DC | PRN
  Administered 2014-05-07 (×2): 50 mg via ORAL
  Filled 2014-05-07 (×2): qty 2

## 2014-05-07 MED ORDER — METOPROLOL TARTRATE 25 MG PO TABS
25.0000 mg | ORAL_TABLET | Freq: Two times a day (BID) | ORAL | Status: DC
  Administered 2014-05-07 – 2014-05-11 (×8): 25 mg via ORAL
  Filled 2014-05-07 (×15): qty 1
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
## 2014-05-07 NOTE — Consult Note (Signed)
Face to face evaluation and I agree with this note 

## 2014-05-07 NOTE — ED Notes (Signed)
Provided pt with printed educational material on Vistaril and Prozac.
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
## 2014-05-07 NOTE — Consult Note (Signed)
[facility] Consult   Reason for Consult:  Suicidal thoughts Referring Physician:  ER MD  Gabriel Ashley is an 48 y.o. male. Total Time spent with patient: 45 minutes  Assessment: AXIS I:  Anxiety Disorder NOS, Depressive Disorder NOS, Substance Abuse and Substance Induced Mood Disorder AXIS II:  Deferred AXIS III:   Past Medical History  Diagnosis Date  . Hypertension   . Anxiety   . Asthma    AXIS IV:  economic problems, problems related to social environment and problems with access to health care services AXIS V:  41-50 serious symptoms  Plan:  Recommend psychiatric Inpatient admission when medically cleared.  Subjective:   Gabriel Ashley is a 48 y.o. male patient admitted with suicidal  threats.  HPI: Patient continues to endorse suicidal ideation and wanting to hurt his girlfriend.  Patient states that he has increasing anxiety and asking for something to help calm him down.  Patient denies psychosis and paranoia.   HPI Elements:   Location:  depression. Quality:  suicidal thoughts. Severity:  cannot contract for safety. Timing:  not getting along with his girlfriend. Duration:  one day with suicidal thoughts. Context:  as above.  Past Psychiatric History: Past Medical History  Diagnosis Date  . Hypertension   . Anxiety   . Asthma     reports that he has been smoking.  He does not have any smokeless tobacco history on file. He reports that he drinks alcohol. He reports that he uses illicit drugs (Cocaine and Marijuana). No family history on file. Family History Family Supports: No ("Not in [place] I'm from [place]") Living Arrangements: Other (Comment);Spouse/significant other ("I live with my girlfriend") Can pt return to current living arrangement?: No ("No because we are going through some issues") Abuse/Neglect [facility]) Physical Abuse: Denies Verbal Abuse: Denies Sexual Abuse: Denies Allergies:  No Known Allergies  ACT Assessment Complete:  Yes:     Educational Status    Risk to Self: Risk to self with the past 6 months Suicidal Ideation: Yes-Currently Present (patient's suicidal thoughts started today) Suicidal Intent: Yes-Currently Present Is patient at risk for suicide?: No Suicidal Plan?: Yes-Currently Present Specify Current Suicidal Plan:  (run in front of a bus) Access to Means: Yes Specify Access to Suicidal Means:  (access to the city bus and ability to run) Previous Attempts/Gestures: No How many times?:  (0) Other Self Harm Risks:  (none reported ) Triggers for Past Attempts: Other (Comment) (no previous attempts or gestures) Intentional Self Injurious Behavior: None Family Suicide History: No Recent stressful life event(s): Other (Comment) ("I can't say specifically") Persecutory voices/beliefs?: No Depression: Yes Depression Symptoms: Feeling angry/irritable;Feeling worthless/self pity;Loss of interest in usual pleasures;Fatigue;Isolating;Tearfulness;Insomnia;Despondent Substance abuse history and/or treatment for substance abuse?: Yes Suicide prevention information given to non-admitted patients: Not applicable  Risk to Others: Risk to Others within the past 6 months Homicidal Ideation: No Thoughts of Harm to Others: No Current Homicidal Intent: No Current Homicidal Plan: No Access to Homicidal Means: No Identified Victim:  (n/a) History of harm to others?: No Assessment of Violence: None Noted Violent Behavior Description:  (patient calm and cooperative ) Does patient have access to weapons?: No Criminal Charges Pending?: Yes Describe Pending Criminal Charges:  ("It's all domestic with girlfriend") Does patient have a court date: Yes Court Date:  (May 18, 2014 (domestic violence))  Abuse: Abuse/Neglect Assessment (Assessment to be complete while patient is alone) Physical Abuse: Denies Verbal Abuse: Denies Sexual Abuse: Denies Exploitation of patient/patient's resources: Denies  Self-Neglect: Denies   Prior Inpatient Therapy: Prior Inpatient Therapy Prior Inpatient Therapy: Yes Prior Therapy Dates:  Publishing copy- "It's been a few of them" ) Prior Therapy Facilty/Provider(s):  [facility]) Reason for Treatment:  (depression and suicidal thoughts )  Prior Outpatient Therapy: Prior Outpatient Therapy Prior Outpatient Therapy: Yes Prior Therapy Dates:  ([place] ) Prior Therapy Facilty/Provider(s):  ([facility] [place]) Reason for Treatment:  (depression and suicidal thoughts)  Additional Information: Additional Information 1:1 In Past 12 Months?: No CIRT Risk: No Elopement Risk: No Does patient have medical clearance?: Yes   No family history on file. Review of Systems  Constitutional: Negative for weight loss, malaise/fatigue and diaphoresis.  HENT: Negative.   Musculoskeletal: Negative.   Neurological: Negative.   Psychiatric/Behavioral: Positive for depression, suicidal ideas and substance abuse. Negative for hallucinations and memory loss. The patient is nervous/anxious. The patient does not have insomnia.       Objective: Blood pressure 137/88, pulse 58, temperature 98.1 F (36.7 C), temperature source Oral, resp. rate 18, height 6' (1.829 m), weight 106.595 kg (235 lb), SpO2 100.00%.Body mass index is 31.86 kg/(m^2). Results for orders placed during the hospital encounter of 05/05/14 (from the past 72 hour(s))  I-STAT TROPOININ, ED     Status: None   Collection Time    05/05/14  9:53 AM      Result Value Ref Range   Troponin i, poc 0.01  0.00 - 0.08 ng/mL   Comment 3            Comment: Due to the release kinetics of cTnI,     a negative result within the first hours     of the onset of symptoms does not rule out     myocardial infarction with certainty.     If myocardial infarction is still suspected,     repeat the test at appropriate intervals.  CBC WITH DIFFERENTIAL     Status: None   Collection Time     05/05/14 10:08 AM      Result Value Ref Range   WBC 7.7  4.0 - 10.5 K/uL   RBC 4.85  4.22 - 5.81 MIL/uL   Hemoglobin 13.6  13.0 - 17.0 g/dL   HCT 40.9  39.0 - 52.0 %   MCV 84.3  78.0 - 100.0 fL   MCH 28.0  26.0 - 34.0 pg   MCHC 33.3  30.0 - 36.0 g/dL   RDW 15.1  11.5 - 15.5 %   Platelets 274  150 - 400 K/uL   Neutrophils Relative % 75  43 - 77 %   Neutro Abs 5.8  1.7 - 7.7 K/uL   Lymphocytes Relative 18  12 - 46 %   Lymphs Abs 1.4  0.7 - 4.0 K/uL   Monocytes Relative 7  3 - 12 %   Monocytes Absolute 0.5  0.1 - 1.0 K/uL   Eosinophils Relative 0  0 - 5 %   Eosinophils Absolute 0.0  0.0 - 0.7 K/uL   Basophils Relative 0  0 - 1 %   Basophils Absolute 0.0  0.0 - 0.1 K/uL  BASIC METABOLIC PANEL     Status: Abnormal   Collection Time    05/05/14 10:08 AM      Result Value Ref Range   Sodium 141  137 - 147 mEq/L   Potassium 4.4  3.7 - 5.3 mEq/L   Chloride 105  96 - 112 mEq/L   CO2 18 (*)  19 - 32 mEq/L   Glucose, Bld 90  70 - 99 mg/dL   BUN 26 (*) 6 - 23 mg/dL   Creatinine, Ser 2.73 (*) 0.50 - 1.35 mg/dL   Calcium 9.4  8.4 - 10.5 mg/dL   GFR calc non Af Amer 26 (*) >90 mL/min   GFR calc Af Amer 30 (*) >90 mL/min   Comment: (NOTE)     The eGFR has been calculated using the CKD EPI equation.     This calculation has not been validated in all clinical situations.     eGFR's persistently <90 mL/min signify possible Chronic Kidney     Disease.   Anion gap 18 (*) 5 - 15  COMPREHENSIVE METABOLIC PANEL     Status: Abnormal   Collection Time    05/05/14  1:05 PM      Result Value Ref Range   Sodium 138  137 - 147 mEq/L   Potassium 4.3  3.7 - 5.3 mEq/L   Chloride 103  96 - 112 mEq/L   CO2 20  19 - 32 mEq/L   Glucose, Bld 85  70 - 99 mg/dL   BUN 27 (*) 6 - 23 mg/dL   Creatinine, Ser 2.65 (*) 0.50 - 1.35 mg/dL   Calcium 9.4  8.4 - 10.5 mg/dL   Total Protein 8.1  6.0 - 8.3 g/dL   Albumin 4.0  3.5 - 5.2 g/dL   AST 26  0 - 37 U/L   ALT 24  0 - 53 U/L   Alkaline Phosphatase 112  39 -  117 U/L   Total Bilirubin 0.7  0.3 - 1.2 mg/dL   GFR calc non Af Amer 27 (*) >90 mL/min   GFR calc Af Amer 31 (*) >90 mL/min   Comment: (NOTE)     The eGFR has been calculated using the CKD EPI equation.     This calculation has not been validated in all clinical situations.     eGFR's persistently <90 mL/min signify possible Chronic Kidney     Disease.   Anion gap 15  5 - 15  ETHANOL     Status: None   Collection Time    05/05/14  1:05 PM      Result Value Ref Range   Alcohol, Ethyl (B) <11  0 - 11 mg/dL   Comment:            LOWEST DETECTABLE LIMIT FOR     SERUM ALCOHOL IS 11 mg/dL     FOR MEDICAL PURPOSES ONLY  URINE RAPID DRUG SCREEN (HOSP PERFORMED)     Status: Abnormal   Collection Time    05/05/14  1:19 PM      Result Value Ref Range   Opiates NONE DETECTED  NONE DETECTED   Cocaine POSITIVE (*) NONE DETECTED   Benzodiazepines NONE DETECTED  NONE DETECTED   Amphetamines NONE DETECTED  NONE DETECTED   Tetrahydrocannabinol POSITIVE (*) NONE DETECTED   Barbiturates NONE DETECTED  NONE DETECTED   Comment:            DRUG SCREEN FOR MEDICAL PURPOSES     ONLY.  IF CONFIRMATION IS NEEDED     FOR ANY PURPOSE, NOTIFY LAB     WITHIN 5 DAYS.                LOWEST DETECTABLE LIMITS     FOR URINE DRUG SCREEN     Drug Class       Cutoff (ng/mL)  Amphetamine      1000     Barbiturate      200     Benzodiazepine   063     Tricyclics       016     Opiates          300     Cocaine          300     THC              50   Labs are reviewed and are pertinent for cocaine and marijuana.  Medications reviewed will start Prozac 20 mg daily for depression and Vistaril 50 mg Tid Prn for anxiety.  Current Facility-Administered Medications  Medication Dose Route Frequency Provider Last Rate Last Dose  . FLUoxetine (PROZAC) capsule 20 mg  20 mg Oral Daily Shuvon Rankin, NP      . hydrochlorothiazide (HYDRODIURIL) tablet 25 mg  25 mg Oral Daily Tanna Furry, MD   25 mg at 05/07/14 1015  .  hydrOXYzine (ATARAX/VISTARIL) tablet 50 mg  50 mg Oral TID PRN Shuvon Rankin, NP   50 mg at 05/07/14 1028  . ibuprofen (ADVIL,MOTRIN) tablet 800 mg  800 mg Oral Q6H PRN Tanna Furry, MD   800 mg at 05/07/14 0119  . metoprolol tartrate (LOPRESSOR) tablet 25 mg  25 mg Oral BID Tanna Furry, MD   25 mg at 05/07/14 1014  . ziprasidone (GEODON) injection 20 mg  20 mg Intramuscular Q6H PRN Tanna Furry, MD       Current Outpatient Prescriptions  Medication Sig Dispense Refill  . atenolol (TENORMIN) 25 MG tablet Take by mouth daily.      . hydrochlorothiazide (HYDRODIURIL) 25 MG tablet Take 25 mg by mouth daily.      . metoprolol tartrate (LOPRESSOR) 25 MG tablet Take 25 mg by mouth 2 (two) times daily.      . traMADol (ULTRAM) 50 MG tablet Take 1 tablet (50 mg total) by mouth every 6 (six) hours as needed.  15 tablet  0    Psychiatric Specialty Exam:     Blood pressure 137/88, pulse 58, temperature 98.1 F (36.7 C), temperature source Oral, resp. rate 18, height 6' (1.829 m), weight 106.595 kg (235 lb), SpO2 100.00%.Body mass index is 31.86 kg/(m^2).  General Appearance: Casual  Eye Contact::  Good  Speech:  Clear and Coherent  Volume:  Normal  Mood:  Depressed  Affect:  Congruent  Thought Process:  Coherent  Orientation:  Full (Time, Place, and Person)  Thought Content:  Negative  Suicidal Thoughts:  Yes.  with intent/plan  Homicidal Thoughts:  No  Patient states that he doesn't want to kill his girlfriend "but I'm afraid I will hurt her if I see her."  Memory:  Immediate;   Good Recent;   Good Remote;   Good  Judgement:  Fair  Insight:  Fair  Psychomotor Activity:  Normal  Concentration:  Good  Recall:  Good  Fund of Knowledge:Good  Language: Good  Akathisia:  Negative  Handed:  Right  AIMS (if indicated):     Assets:  Chartered certified accountant  Sleep:      Musculoskeletal: Strength & Muscle Tone: within normal limits Gait & Station: normal Patient leans:  NA  Treatment Plan Summary: Daily contact with patient to assess and evaluate symptoms and progress in treatment Medication management seek inpatient bed for treatment of depression  Will continue with current treatment plan for inpatient treatment.  Prozac  and Vistaril started.  Rankin, Shuvon, FNP-BC 05/07/2014 10:31 AM

## 2014-05-07 NOTE — Progress Notes (Signed)
D   Pt appears depressed and sad   He said he has bad sleep apnea but was unable to get anybody to bring in his CPAP machine   He also reports problems with asthma    He has been appropriate on the unit so far  A   Notified on call person of pt sleep apnea and a respiratory consult was ordered  Orders written for albuterol inhaler written   Verbal support given   Medications administered and effectiveness monitored   Q 15 min checks R   Pt safe at present and was receptive

## 2014-05-07 NOTE — Progress Notes (Signed)
Adult Psychoeducational Group Note  Date:  05/07/2014 Time:  10:28 PM  Group Topic/Focus:  Wrap-Up Group:   The focus of this group is to help patients review their daily goal of treatment and discuss progress on daily workbooks.  Participation Level:  Active  Participation Quality:  Attentive  Affect:  Appropriate  Cognitive:  Alert  Insight: Appropriate  Engagement in Group:  Engaged  Modes of Intervention:  Activity  Additional Comments:  Pt was in group engaged with others   [place]line Semrad, [place] 05/07/2014, 10:28 PM

## 2014-05-07 NOTE — ED Notes (Signed)
Called for [facility] transport

## 2014-05-07 NOTE — ED Notes (Signed)
Notified MD of pt elevated BP, --called Danae Chen RN at [facility] to update on pt BP, pt transferred to [facility] via [facility].

## 2014-05-08 DIAGNOSIS — F1420 Cocaine dependence, uncomplicated: Secondary | ICD-10-CM

## 2014-05-08 DIAGNOSIS — F329 Major depressive disorder, single episode, unspecified: Secondary | ICD-10-CM

## 2014-05-08 DIAGNOSIS — F4310 Post-traumatic stress disorder, unspecified: Secondary | ICD-10-CM

## 2014-05-08 DIAGNOSIS — R7989 Other specified abnormal findings of blood chemistry: Secondary | ICD-10-CM

## 2014-05-08 DIAGNOSIS — R45850 Homicidal ideations: Secondary | ICD-10-CM

## 2014-05-08 DIAGNOSIS — F3289 Other specified depressive episodes: Secondary | ICD-10-CM

## 2014-05-08 LAB — URINALYSIS, ROUTINE W REFLEX MICROSCOPIC
BILIRUBIN URINE: NEGATIVE
Glucose, UA: NEGATIVE mg/dL
Ketones, ur: NEGATIVE mg/dL
Leukocytes, UA: NEGATIVE
NITRITE: NEGATIVE
Protein, ur: NEGATIVE mg/dL
SPECIFIC GRAVITY, URINE: 1.019 (ref 1.005–1.030)
Urobilinogen, UA: 0.2 mg/dL (ref 0.0–1.0)
pH: 5 (ref 5.0–8.0)

## 2014-05-08 LAB — RENAL FUNCTION PANEL
Albumin: 3.9 g/dL (ref 3.5–5.2)
Anion gap: 14 (ref 5–15)
BUN: 27 mg/dL — AB (ref 6–23)
CALCIUM: 8.9 mg/dL (ref 8.4–10.5)
CO2: 24 mEq/L (ref 19–32)
Chloride: 102 mEq/L (ref 96–112)
Creatinine, Ser: 2.89 mg/dL — ABNORMAL HIGH (ref 0.50–1.35)
GFR calc Af Amer: 28 mL/min — ABNORMAL LOW (ref >90)
GFR calc non Af Amer: 24 mL/min — ABNORMAL LOW (ref >90)
Glucose, Bld: 106 mg/dL — ABNORMAL HIGH (ref 70–99)
PHOSPHORUS: 4.4 mg/dL (ref 2.3–4.6)
Potassium: 4.1 mEq/L (ref 3.7–5.3)
Sodium: 140 mEq/L (ref 137–147)

## 2014-05-08 LAB — URINE MICROSCOPIC-ADD ON

## 2014-05-08 MED ORDER — NICOTINE POLACRILEX 2 MG MT GUM
2.0000 mg | CHEWING_GUM | OROMUCOSAL | Status: DC | PRN
  Administered 2014-05-08 – 2014-05-12 (×8): 2 mg via ORAL
  Filled 2014-05-08 (×5): qty 1

## 2014-05-08 MED ORDER — BUPROPION HCL ER (XL) 150 MG PO TB24
150.0000 mg | ORAL_TABLET | Freq: Every day | ORAL | Status: DC
  Administered 2014-05-08 – 2014-05-11 (×4): 150 mg via ORAL
  Filled 2014-05-08 (×6): qty 1

## 2014-05-08 NOTE — Consult Note (Addendum)
[facility] Medical Consultation  Ashwin Demond [id number] DOB: [birth year] DOA: 05/07/2014 PCP: No primary provider on file.   Requesting physician: Dr. Parke Poisson Date of consultation: 05/08/14 Reason for consultation: addendum: Elevated serum creatinine  Impression/Recommendations Active Problems:   Severe recurrent major depression without psychotic features - Psychiatry managing    Elevated serum creatinine -Will obtain renal ultrasound - Workup with routine lab - Urine sodium and urine creatinine levels will be obtained in order to calculate FENa  I will followup again tomorrow. Please contact me if I can be of assistance in the meanwhile. Thank you for this consultation.  Chief Complaint: Depressive disorder  HPI:  Patient is a 48 year old with history of hypertension and history of cocaine addiction.  He presented to behavioral health secondary to relapsing on crack cocaine. We were consulted for evaluation of elevated serum creatinine. Patient reports that he has had this problem before although he has not had this worked up to state he has had a history of hypertension of which at times he is not consistent in taking his medications. He denies any headaches, blurred vision, or abdominal discomfort.  We were consulted for further evaluation of elevated serum creatinine.  Review of Systems:  14 point review of systems evaluated and only positive if mentioned above  Past Medical History  Diagnosis Date  . Hypertension   . Anxiety   . Asthma    History reviewed. No pertinent past surgical history. Social History:  reports that he has been smoking.  He does not have any smokeless tobacco history on file. He reports that he drinks alcohol. He reports that he uses illicit drugs (Cocaine and Marijuana).  No Known Allergies History reviewed. No pertinent family history.  Prior to Admission medications   Medication Sig Start Date End Date Taking? Authorizing Provider   atenolol (TENORMIN) 25 MG tablet Take by mouth daily.   Yes Historical Provider, MD  hydrochlorothiazide (HYDRODIURIL) 25 MG tablet Take 25 mg by mouth daily.   Yes Historical Provider, MD  metoprolol tartrate (LOPRESSOR) 25 MG tablet Take 25 mg by mouth 2 (two) times daily.   Yes Historical Provider, MD  traMADol (ULTRAM) 50 MG tablet Take 1 tablet (50 mg total) by mouth every 6 (six) hours as needed. 01/18/14  Yes Gregor Hams, MD   Physical Exam: Blood pressure 148/84, pulse 60, temperature 97.5 F (36.4 C), temperature source Oral, resp. rate 18, height 5\' 10"  (1.778 m), weight 106.595 kg (235 lb), SpO2 98.00%. Filed Vitals:   05/08/14 0616  BP: 148/84  Pulse: 60  Temp:   Resp:      General:  Pt in nad, alert and awake  Eyes: EOMI, non icteric  ENT: normal exterior appearance, no masses on visual examination  Neck: supple, no goiter  Cardiovascular: rrr, no mrg  Respiratory: CTA BL, no wheezes  Abdomen: soft, NT, ND  Skin: No rashes on limited visual examination  Musculoskeletal: No cyanosis or clubbing  Psychiatric: Mood and affect appropriate  Neurologic: No focal neurological deficits on limited exam  Labs on Admission:  Basic Metabolic Panel:  Recent Labs Lab 05/05/14 1008 05/05/14 1305  NA 141 138  K 4.4 4.3  CL 105 103  CO2 18* 20  GLUCOSE 90 85  BUN 26* 27*  CREATININE 2.73* 2.65*  CALCIUM 9.4 9.4   Liver Function Tests:  Recent Labs Lab 05/05/14 1305  AST 26  ALT 24  ALKPHOS 112  BILITOT 0.7  PROT 8.1  ALBUMIN 4.0  No results found for this basename: LIPASE, AMYLASE,  in the last 168 hours No results found for this basename: AMMONIA,  in the last 168 hours CBC:  Recent Labs Lab 05/05/14 1008  WBC 7.7  NEUTROABS 5.8  HGB 13.6  HCT 40.9  MCV 84.3  PLT 274   Cardiac Enzymes: No results found for this basename: CKTOTAL, CKMB, CKMBINDEX, TROPONINI,  in the last 168 hours BNP: No components found with this basename: POCBNP,   CBG: No results found for this basename: GLUCAP,  in the last 168 hours  Radiological Exams on Admission: No results found.   Time spent: > 35 minutes  Velvet Bathe [facility] Pager [phone]  If 7PM-7AM, please contact night-coverage [email or web address] Password [facility] 05/08/2014, 4:31 PM

## 2014-05-08 NOTE — BHH Group Notes (Signed)
[facility] LCSW Aftercare Discharge Planning Group Note   05/08/2014 10:23 AM    Participation Quality:  Appropraite  Mood/Affect:  Appropriate  Depression Rating:  8  Anxiety Rating:  8  Thoughts of Suicide:  No  Will you contract for safety?   NA  Current AVH:  No  Plan for Discharge/Comments:  Patient attended discharge planning group and actively participated in group.  He advised of having home.  Patient stated he has been seen by [facility] but no recent contact with them.  CSW provided all participants with daily workbook.   Transportation Means: Patient has transportation.   Supports:  Patient has a support system.   Nadege Carriger, Eulas Post
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
## 2014-05-08 NOTE — Tx Team (Signed)
Interdisciplinary Treatment Plan Update   Date Reviewed:  05/08/2014  Time Reviewed:  8:36 AM  Progress in Treatment:   Attending groups: Yes Participating in groups: Yes Taking medication as prescribed: Yes  Tolerating medication: Yes Family/Significant other contact made:  No, but will ask patient for consent for collateral contact Patient understands diagnosis: Yes  Discussing patient identified problems/goals with staff: Yes Medical problems stabilized or resolved: Yes Denies suicidal/homicidal ideation: Yes, patient denies SI but endorses HI and states he would act on thoughts if discharged. Patient has not harmed self or others: Yes  For review of initial/current patient goals, please see plan of care.  Estimated Length of Stay:  3-4 days  Reasons for Continued Hospitalization:  Anxiety Depression Medication stabilization Homicidal Ideation  New Problems/Goals identified:    Discharge Plan or Barriers:   Home with outpatient follow up to be determined  Additional Comments:  Gabriel Ashley is an 48 y.o. male that presented to [facility] seeking residential services for his cocaine use. Patient reports using cocaine 1x in the past 2 yrs and his 1x use was yesterday. No alcohol use reported. LCSW met with patient and explained to patient options available. Patient was placed up for discharge and upon discharging patient, patient states he does not feel safe leaving. Patient reported suicidal thoughts with a plan to run into traffic. Sts that his suicidal thoughts started today while he was in the Emergency Department. Patient does not identify any specific triggers. Per ED notes, "When patient was asked why he has suicidal thoughts he states b/c he thinks he wants to kill himself, patient does not have a specific plan and did not verbalize suicidal ideation when asked before". Patient denies previous hx of suicidal attempts/gestures. He denies hx of self mutilating behaviors.  He denies HI and AVH's. Patient    Attendees:  Patient:  05/08/2014 8:36 AM   Signature:  Gabriel Earing, MD 05/08/2014 8:36 AM  Signature: Gypsy Balsam, MD 05/08/2014 8:36 AM  Signature:  Eduard Roux, RN 05/08/2014 8:36 AM  Signature: 05/08/2014 8:36 AM  Signature:   05/08/2014 8:36 AM  Signature:  Joette Catching, LCSW 05/08/2014 8:36 AM  Signature:  Erasmo Downer Drinkard, LCSW-A 05/08/2014 8:36 AM  Signature:  Lucinda Dell, Care Coordinator [facility] 05/08/2014 8:36 AM  Signature:  05/08/2014 8:36 AM  Signature: Marilynne Halsted, RN 05/08/2014  8:36 AM  Signature:    05/08/2014  8:36 AM  Signature: 05/08/2014  8:36 AM    Scribe for Treatment Team:   Joette Catching,  05/08/2014 8:36 AM

## 2014-05-08 NOTE — Progress Notes (Signed)
Adult Psychoeducational Group Note  Date:  05/08/2014 Time:  11:12 AM  Group Topic/Focus:  Relapse Prevention Planning:   The focus of this group is to define relapse and discuss the need for planning to combat relapse.  Participation Level:  Active  Participation Quality:  Appropriate  Affect:  Appropriate  Cognitive:  Alert  Insight: Appropriate  Engagement in Group:  Engaged  Modes of Intervention:  Discussion  Additional Comments:  Patient states that he is working on dealing with that life is not perfect and dealing with emotions.  Hollie Salk Jvette 05/08/2014, 11:12 AM

## 2014-05-08 NOTE — BHH Suicide Risk Assessment (Signed)
   Nursing information obtained from:    Demographic factors:   48 year old man, single, employed Current Mental Status:   See below Loss Factors:   relationship issues Historical Factors:   history of PTSD and Cocaine Dependence Risk Reduction Factors:   employed, motivated Total Time spent with patient: 45 minutes  CLINICAL FACTORS:   Depression:   Comorbid alcohol abuse/dependence Alcohol/Substance Abuse/Dependencies  Psychiatric Specialty Exam: Physical Exam  ROS  Blood pressure 148/84, pulse 60, temperature 97.5 F (36.4 C), temperature source Oral, resp. rate 18, height 5\' 10"  (1.778 m), weight 106.595 kg (235 lb), SpO2 98.00%.Body mass index is 33.72 kg/(m^2).  See admit note MSE  COGNITIVE FEATURES THAT CONTRIBUTE TO RISK:  Polarized thinking    SUICIDE RISK:   Moderate:  Frequent suicidal ideation with limited intensity, and duration, some specificity in terms of plans, no associated intent, good self-control, limited dysphoria/symptomatology, some risk factors present, and identifiable protective factors, including available and accessible social support.  PLAN OF CARE: Patient will be admitted to inpatient psychiatric unit for stabilization and safety. Will provide and encourage milieu participation. Provide medication management and maked adjustments as needed.  Will follow daily.    I certify that inpatient services furnished can reasonably be expected to improve the patient's condition.  COBOS, [place] 05/08/2014, 12:56 PM

## 2014-05-08 NOTE — BHH Group Notes (Signed)
[facility] LCSW Group Therapy  Feelings Around Relapse 1:15 -2:30        05/08/2014   Type of Therapy:  Group Therapy  Participation Level:  Appropriate  Participation Quality:  Appropriate  Affect:  Appropriate, Drowsy  Cognitive:Appropriate  Insight:  Developing/Improving  Engagement in Therapy: Developing/Improving  Modes of Intervention:  Discussion Exploration Problem-Solving Supportive  Summary of Progress/Problems:  The topic for today was feelings around relapse.    Patient did not participate in the discussion.  Concha Pyo 05/08/2014

## 2014-05-08 NOTE — ED Provider Notes (Signed)
Medical screening examination/treatment/procedure(s) were conducted as a shared visit with non-physician practitioner(s) and myself.  I personally evaluated the patient during the encounter.   EKG Interpretation   Date/Time:  Tuesday May 05 2014 08:45:30 EDT Ventricular Rate:  97 PR Interval:  133 QRS Duration: 90 QT Interval:  360 QTC Calculation: 457 R Axis:   71 Text Interpretation:  Sinus rhythm Left ventricular hypertrophy ED  PHYSICIAN INTERPRETATION AVAILABLE IN [facility] Confirmed by TEST,  Record ([id number]) on 05/07/2014 7:02:11 AM      Please see my additional dictation.  Tanna Furry, MD 05/08/14 [phone]
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
## 2014-05-08 NOTE — Progress Notes (Signed)
Assumed care of patient at 2300. Observed with even and unlabored RR. No acute distress and no complaints. Level III obs in place and pt is safe. Gabriel Ashley

## 2014-05-08 NOTE — Progress Notes (Addendum)
D.  Pt with flat affect but pleasant with conversation.  Denies complaints at this time.  Had complained during day of insomnia so made aware of available Vistaril to help him sleep tonight.  Denies SI/HI/halluciantions at this time.  Interacting appropriately with peers on unit.  Positive for evening AA group.  A.  Support and encouragement offered  R.  Pt states he will let me know if he feels he needs the Vistaril tonight for sleep after he tries to sleep on his own.  Pt remains safe on unit, will continue to monitor.  Pt reported that he intends to be up all night because he slept all day.

## 2014-05-08 NOTE — Progress Notes (Signed)
D: Patient appropriate and cooperative with staff. Patient's affect flat, depressed and mood is anxious. He reported on the self inventory sheet that sleep and ability to concentrate are both poor, appetite is good and energy level is low. Patient rates depression/feelings of hopelessness "6" and anxiety "5". He's attending groups and outside of the sessions, he's pacing the hallway. Patient adheres to medication regimen.  A: Support and encouragement provided to patient. Administered scheduled medications per ordering MD. Monitor Q15 minute checks for safety.  R: Patient receptive. Denies SI/HI and auditory/visual hallucinations. Patient remains safe on the unit.

## 2014-05-08 NOTE — [facility] Group Notes (Signed)
Adult Psychoeducational Group Note  Date:  05/08/2014 Time:  9:09 PM  Group Topic/Focus:  Wrap-Up Group:   The focus of this group is to help patients review their daily goal of treatment and discuss progress on daily workbooks.  Participation Level:  Minimal  Participation Quality:  Attentive  Affect:  Appropriate  Cognitive:  Appropriate  Insight: Good  Engagement in Group:  Poor  Modes of Intervention:  Discussion  Additional Comments:  Pt stated that his depression was a 6 out of 10 and he is having homicidal Ideations. Pt stated that his primary goal for today was to speak to the doctor to discuss his treatments plans. Pt also stated that he hoped he would here long enough to think out his situation and come up with ways to stay out of trouble.  Donnelly, Legall A 05/08/2014, 9:09 PM

## 2014-05-09 DIAGNOSIS — F1910 Other psychoactive substance abuse, uncomplicated: Secondary | ICD-10-CM

## 2014-05-09 LAB — SODIUM, URINE, RANDOM: SODIUM UR: 159 meq/L

## 2014-05-09 LAB — CREATININE, URINE, RANDOM: CREATININE, URINE: 159.7 mg/dL

## 2014-05-09 MED ORDER — HYDRALAZINE HCL 25 MG PO TABS
25.0000 mg | ORAL_TABLET | Freq: Three times a day (TID) | ORAL | Status: DC
  Administered 2014-05-09 – 2014-05-12 (×10): 25 mg via ORAL
  Filled 2014-05-09 (×18): qty 1

## 2014-05-09 MED ORDER — AMLODIPINE BESYLATE 5 MG PO TABS
5.0000 mg | ORAL_TABLET | Freq: Every day | ORAL | Status: DC
  Administered 2014-05-09 – 2014-05-12 (×4): 5 mg via ORAL
  Filled 2014-05-09 (×8): qty 1

## 2014-05-09 NOTE — [facility] Group Notes (Signed)
[place] Group Notes:  (Clinical Social Work)  05/09/2014   1:15-2:15PM  Summary of Progress/Problems:   The main focus of today's process group was for the patient to identify ways in which they have sabotaged their own mental health wellness/recovery.  Motivational interviewing and a handout were used to explore the benefits and costs of their self-sabotaging behavior as well as the benefits and costs of changing this behavior.  The Stages of Change were explained to the group using a handout, and patients identified where they are with regard to changing self-defeating behaviors.  The patient expressed he self-sabotages with silence, refusing to talk or express himself at times.  He feels he is in the Preparation Stage of Change.  Type of Therapy:  Process Group  Participation Level:  Active  Participation Quality:  Attentive and Sharing  Affect:  Appropriate  Cognitive:  Appropriate  Insight:  Developing/Improving  Engagement in Therapy:  Engaged  Modes of Intervention:  Education, Motivational Interviewing   Selmer Dominion, LCSW 05/09/2014, 4:00pm
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
## 2014-05-09 NOTE — Progress Notes (Addendum)
D.  Pt pleasant on approach, no complaints voiced other than right shoulder pain that is chronic for him.  Positive for evening group, interacting appropriately with peers on the unit.  Denies SI/HI/hallucinations at this time.  A.  Support and encouragement offered  R.   Pt remains safe on unit, will continue to monitor.  Pt requested and received patient education printouts on Norvasc and Hydralazine.

## 2014-05-09 NOTE — Progress Notes (Signed)
Chart reviewed. Case discussed with NP. Agree with above assessment and plan.  Dereck Leep, MD
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
## 2014-05-09 NOTE — Progress Notes (Signed)
Note: This document was prepared with digital dictation and possible smart phrase technology. Any transcriptional errors that result from this process are unintentional.   Gabriel Ashley [id number] DOB: 1966/01/28 DOA: 05/07/2014 PCP: No primary provider on file.  Comment  discontinue HCTZ as could worsen renal function.  I have also d/c his Ibuprofen.  Recommend Tylenol use for gen aches and pains  Recommend follow-up PCP for progression HTN renal disease and would need referral then to nephrology for discussion about this as well--there is no acute need to refer or work this up presently with renal US.  his labs are inaccurate for FeNA  If blood pressure above 140 over /80 at time of discharge, add Amlodipine 2.5-5 mg daily   Thanks for this consult.  We will sign off at this time  Brief narrative: 48 y/o ? admitted to [facility] with anxiety, Substance abuse, Homicidal ideation.  Eventually found to have elevated creatinine above basleine from 07/2013 27/1.87  Past medical history-As per Problem list Chart reviewed as below-   Consultants:    Procedures:    Antibiotics:     Subjective  Alert pleasant no current c/o   Objective    Interim History:   Telemetry:    Objective: Filed Vitals:   05/08/14 0615 05/08/14 0616 05/08/14 1710 05/08/14 1712  BP: 140/83 148/84 174/125 173/114  Pulse: 56 60 69 81  Temp: 97.5 F (36.4 C)     TempSrc:      Resp: 18     Height:      Weight:      SpO2:       No intake or output data in the 24 hours ending 05/09/14 0737  Exam:  General: alert pleasant obese no apparent distress Cardiovascular: s1 s2 no m/r/g Respiratory:  clear Skin no LE edema   Data Reviewed: Basic Metabolic Panel:  Recent Labs Lab 05/05/14 1008 05/05/14 1305 05/08/14 1925  NA 141 138 140  K 4.4 4.3 4.1  CL 105 103 102  CO2 18* 20 24  GLUCOSE 90 85 106*  BUN 26* 27* 27*  CREATININE 2.73* 2.65* 2.89*  CALCIUM 9.4 9.4 8.9  PHOS  --    --  4.4   Liver Function Tests:  Recent Labs Lab 05/05/14 1305 05/08/14 1925  AST 26  --   ALT 24  --   ALKPHOS 112  --   BILITOT 0.7  --   PROT 8.1  --   ALBUMIN 4.0 3.9   No results found for this basename: LIPASE, AMYLASE,  in the last 168 hours No results found for this basename: AMMONIA,  in the last 168 hours CBC:  Recent Labs Lab 05/05/14 1008  WBC 7.7  NEUTROABS 5.8  HGB 13.6  HCT 40.9  MCV 84.3  PLT 274   Cardiac Enzymes: No results found for this basename: CKTOTAL, CKMB, CKMBINDEX, TROPONINI,  in the last 168 hours BNP: No components found with this basename: POCBNP,  CBG: No results found for this basename: GLUCAP,  in the last 168 hours  No results found for this or any previous visit (from the past 240 hour(s)).   Studies:              All Imaging reviewed and is as per above notation   Scheduled Meds: . buPROPion  150 mg Oral Daily  . metoprolol tartrate  25 mg Oral BID   Continuous Infusions:    Verneita Griffes, MD  [facility] Pager [phone] 05/09/2014,  7:37 AM    LOS: 50 days

## 2014-05-09 NOTE — Progress Notes (Signed)
Pt bp 171/114 standing and 176/110 sitting   Reported same to np / pa and will wait for further instructions

## 2014-05-10 NOTE — Progress Notes (Signed)
D. Pt has been up and has been active in the milieu, attending and participating in various milieu activities. Pt endorses depression and hopelessness today but denies SI. Pt does appear depressed on the unit. Pt ruminating about his girlfriend and how she is continuously on his mind. Pt has received medications today without incident. A. Support and encouragement provided, medication education given. R. Pt verbalized understanding, safety maintained.
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
## 2014-05-10 NOTE — BHH Group Notes (Signed)
0900 nursing orientation group    The focus of this group is to educate the patient on the purpose and policies of crisis stabilization and provide a format to answer questions about their admission.  The group details unit policies and expectations of patients while admitted.  Pt came in late and refused to participate he just sat there.

## 2014-05-10 NOTE — Progress Notes (Signed)
Psychoeducational Group Note  Date: 05/10/2014 Time:  1045  Group Topic/Focus:  Making Healthy Choices:   The focus of this group is to help patients identify negative/unhealthy choices they were using prior to admission and identify positive/healthier coping strategies to replace them upon discharge.  Participation Level:  Active  Participation Quality:  Attentive  Affect:  Appropriate  Cognitive:  Appropriate  Insight:  Limited  Engagement in Group:  Limited  Additional Comments:    05/10/2014,3:43 PM Reigan Tolliver, Trixie Rude

## 2014-05-10 NOTE — BHH Group Notes (Signed)
[facility] Group Notes: (Clinical Social Work)   05/10/2014      Type of Therapy:  Group Therapy   Participation Level:  Did Not Attend    Selmer Dominion, LCSW 05/10/2014, 3:13 PM

## 2014-05-10 NOTE — Progress Notes (Signed)
The patient attended the A. A. Meeting this evening.

## 2014-05-11 DIAGNOSIS — F1410 Cocaine abuse, uncomplicated: Secondary | ICD-10-CM

## 2014-05-11 DIAGNOSIS — R799 Abnormal finding of blood chemistry, unspecified: Secondary | ICD-10-CM

## 2014-05-11 DIAGNOSIS — I10 Essential (primary) hypertension: Secondary | ICD-10-CM | POA: Diagnosis present

## 2014-05-11 MED ORDER — FUROSEMIDE 20 MG PO TABS
20.0000 mg | ORAL_TABLET | Freq: Every day | ORAL | Status: DC
  Administered 2014-05-11 – 2014-05-12 (×2): 20 mg via ORAL
  Filled 2014-05-11 (×5): qty 1

## 2014-05-11 MED ORDER — METOPROLOL TARTRATE 25 MG PO TABS
12.5000 mg | ORAL_TABLET | Freq: Two times a day (BID) | ORAL | Status: DC
  Administered 2014-05-11 – 2014-05-13 (×4): 12.5 mg via ORAL
  Filled 2014-05-11: qty 1
  Filled 2014-05-11: qty 4
  Filled 2014-05-11 (×2): qty 1
  Filled 2014-05-11: qty 4
  Filled 2014-05-11 (×3): qty 1

## 2014-05-11 MED ORDER — MIRTAZAPINE 15 MG PO TABS
15.0000 mg | ORAL_TABLET | Freq: Every day | ORAL | Status: DC
  Administered 2014-05-11 – 2014-05-12 (×2): 15 mg via ORAL
  Filled 2014-05-11 (×4): qty 1
  Filled 2014-05-11: qty 4
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
## 2014-05-11 NOTE — Progress Notes (Signed)
D: Patient in the dayroom playing cards with peers on [facility].  Patient states he had a good day.  Patient appears depressed with flat affect.  Patient states he has difficulty concentrating so he states he will talk to the doctor about it. Patient denies SI but states he has HI towards his girlfriend.  Patient denies AVH. A: Staff to monitor Q 15 mins for safety.  Encouragement and support offered.  Scheduled medications administered per orders. R: Patient remains safe on the unit.  Patient attended group tonight.  Patient visible on the unit and interacting with peers.  Patient taking administered medications.

## 2014-05-11 NOTE — Progress Notes (Signed)
Patient ID: Gabriel Ashley, male   DOB: 02/25/1966, 48 y.o.   MRN: [id number]  D: Pt. Denies SI and A/V Hallucinations. Patient reports HI towards girlfriend but does not elaborate any further. Staff was notified about this as well. Patient reports back pain but refused intervention. Patient rates his depression, hopelessness, and anxiety at 5/10 for the day. Patient reports that his sleep is poor and so is his concentration. Patient reports good appetite but low energy level today.  A: Support and encouragement provided to the patient. Scheduled medications administered to patient per physician's orders. Patient was able to shave while writer watched for safety. Patient reports that he feels better after shaving his face.  R: Patient is receptive and cooperative but can get easily irritated by other patients. Patient is seen in the milieu and attending some groups. Q15 minute checks are maintained for safety.

## 2014-05-11 NOTE — Progress Notes (Signed)
[facility] Group Notes:  (Nursing/MHT/Case Management/Adjunct)  Date:  05/11/2014  Time:  12:45 AM  Type of Therapy:  Psychoeducational Skills  Participation Level:  Active  Participation Quality:  Appropriate  Affect:  Flat  Cognitive:  Appropriate  Insight:  Lacking  Engagement in Group:  Developing/Improving  Modes of Intervention:  Education  Summary of Progress/Problems: Patient states that he had a quiet day since he slept quite a bit. In terms of the theme for the day, his coping skills are as follows: exercising, volunteering, and reading his Bible.   Teofilo Lupinacci S 05/11/2014, 12:45 AM

## 2014-05-11 NOTE — BHH Suicide Risk Assessment (Signed)
[facility] INPATIENT:  Family/Significant Other Suicide Prevention Education  Suicide Prevention Education:  Patient Refusal for Family/Significant Other Suicide Prevention Education: The patient Gabriel Ashley has refused to provide written consent for family/significant other to be provided Family/Significant Other Suicide Prevention Education during admission and/or prior to discharge.  Physician notified.  Karlye Ihrig Hairston 05/11/2014, 1:16 PM

## 2014-05-11 NOTE — BHH Group Notes (Signed)
Adult Psychoeducational Group Note  Date:  05/11/2014 Time:  10:14 PM  Group Topic/Focus:  Wrap-Up Group:   The focus of this group is to help patients review their daily goal of treatment and discuss progress on daily workbooks.  Participation Level:  Active  Participation Quality:  Appropriate and Attentive  Affect:  Appropriate  Cognitive:  Alert and Appropriate  Insight: Appropriate  Engagement in Group:  Engaged  Modes of Intervention:  Discussion  Additional Comments:  Provided that he will probably be single once he leaves and that was something that has been bothering him. He states he has always been with someone and that he is coming to terms with being single once he leaves the facility.   Hettie Holstein D 05/11/2014, 10:14 PM

## 2014-05-11 NOTE — BHH Group Notes (Signed)
[facility] LCSW Aftercare Discharge Planning Group Note   05/11/2014 12:29 PM    Participation Quality:  Appropraite  Mood/Affect:  Appropriate  Depression Rating:  6  Anxiety Rating:  6  Thoughts of Suicide:  No  Will you contract for safety?   NA  Current AVH:  No  Plan for Discharge/Comments:  Patient attended discharge planning group and actively participated in group.  Patient advised of having HI and stated he feels certain he would act on thought he would act out on thoughts if he were outside of the hospitali.  CSW provided all participants with daily workbook.   Transportation Means: Patient has transportation.   Supports:  Patient has a support system.   Sharene Krikorian, Eulas Post

## 2014-05-11 NOTE — Progress Notes (Signed)
[facility] PROGRESS NOTE Interim History: Re-consulted for HTN management   Assessment/Plan:  Essential hypertension, benign: - Bp continue to be elevated with amlodipine 5, hydralazine 25 and metoprolol 25mg . - Not on a diuretic which will have synergistic effect on Bp. - NO, HCTZ or Diuretic will not damage renal function if the pt is euvolemic. - He does not have a PCP, will need to get one to follow on renal function. - Start low dose lasix 20 mg orally daily - Goal < 140/90, Bp is almost at goal. Can be follow up as an outpatient for further titration of meds. - If Bp not improve can go up on norvasc, or hydralazine. - Do not increase metoprolol as HR < 60. Will decrease it to 12.5mg .  Elevated serum creatinine: - At baseline, anticipate a mild rise in cr with Bp correction.  Severe recurrent major depression without psychotic features - per psyq.    Code Status: full Family Communication: none  Disposition Plan: inpatinet   Consultants:  none  Procedures:  Renal ultrasound pending can be done as an outpatinet  Antibiotics:  none  HPI/Subjective: No complains  Objective: Filed Vitals:   05/11/14 0653 05/11/14 0837 05/11/14 1135 05/11/14 1439  BP: 147/73  155/90 155/91  Pulse: 57 64 56 57  Temp:      TempSrc:      Resp:   18 18  Height:      Weight:      SpO2:       No intake or output data in the 24 hours ending 05/11/14 1509 Filed Weights   05/07/14 1812  Weight: 106.595 kg (235 lb)    Exam:  General: in no acute distress.  HEENT: No bruits, no goiter.  Heart: Regular rate and rhythm. Lungs: Good air movement, clear Abdomen: Soft, nontender, nondistended, positive bowel sounds.    Data Reviewed: Basic Metabolic Panel:  Recent Labs Lab 05/05/14 1008 05/05/14 1305 05/08/14 1925  NA 141 138 140  K 4.4 4.3 4.1  CL 105 103 102  CO2 18* 20 24  GLUCOSE 90 85 106*  BUN 26* 27* 27*  CREATININE 2.73* 2.65* 2.89*  CALCIUM  9.4 9.4 8.9  PHOS  --   --  4.4   Liver Function Tests:  Recent Labs Lab 05/05/14 1305 05/08/14 1925  AST 26  --   ALT 24  --   ALKPHOS 112  --   BILITOT 0.7  --   PROT 8.1  --   ALBUMIN 4.0 3.9   No results found for this basename: LIPASE, AMYLASE,  in the last 168 hours No results found for this basename: AMMONIA,  in the last 168 hours CBC:  Recent Labs Lab 05/05/14 1008  WBC 7.7  NEUTROABS 5.8  HGB 13.6  HCT 40.9  MCV 84.3  PLT 274   Cardiac Enzymes: No results found for this basename: CKTOTAL, CKMB, CKMBINDEX, TROPONINI,  in the last 168 hours BNP (last 3 results) No results found for this basename: PROBNP,  in the last 8760 hours CBG: No results found for this basename: GLUCAP,  in the last 168 hours  No results found for this or any previous visit (from the past 240 hour(s)).   Studies: No results found.  Scheduled Meds: . amLODipine  5 mg Oral Daily  . buPROPion  150 mg Oral Daily  . hydrALAZINE  25 mg Oral 3 times per day  . metoprolol tartrate  25 mg Oral BID  Continuous Infusions:    Charlynne Cousins  [facility] Pager [phone]. If 8PM-8AM, please contact night-coverage at [email or web address], password [facility] 05/11/2014, 3:09 PM  LOS: 4 days      **Disclaimer: This note may have been dictated with voice recognition software. Similar sounding words can inadvertently be transcribed and this note may contain transcription errors which may not have been corrected upon publication of note.**

## 2014-05-11 NOTE — Progress Notes (Signed)
Assumed care of pt at 2300.  Pt is resting quietly in bed.  Respirations even, unlabored, WNL.  Pt is in no acute distress.  Will continue to monitor and assess for safety.

## 2014-05-11 NOTE — [facility] Group Notes (Signed)
[facility] L[facility] Group Therapy          Overcoming Obstacles       1:15 -2:30        05/11/2014       Type of Therapy:  Group Therapy  Participation Level:  Appropriate  Participation Quality:  Appropriate  Affect:  Appropriate, Supportive, Tearful  Cognitive:  Attentive Appropriate  Insight: Developing/Improving Engaged  Engagement in Therapy: Developing/Imprvoing Engaged  Modes of Intervention:  Discussion Exploration  Education Rapport BuildingProblem-Solving Support  Summary of Progress/Problems:  The main focus of today's group was overcoming obstacles. Patient shared he can understand how other vet feels.  He shared he had the same type of thoughts several years ago but not at this time.  He was tearful as he identified with other patient and stated it is possible to get better.   Patient able to identify appropriate coping skills.   Concha Pyo 05/11/2014

## 2014-05-12 MED ORDER — ALBUTEROL SULFATE HFA 108 (90 BASE) MCG/ACT IN AERS: 1.0000 | INHALATION_SPRAY | RESPIRATORY_TRACT | Status: DC | PRN

## 2014-05-12 MED ORDER — HYDRALAZINE HCL 50 MG PO TABS
50.0000 mg | ORAL_TABLET | Freq: Three times a day (TID) | ORAL | Status: DC
  Administered 2014-05-12 – 2014-05-13 (×2): 50 mg via ORAL
  Filled 2014-05-12: qty 2
  Filled 2014-05-12 (×5): qty 1

## 2014-05-12 MED ORDER — HYDROXYZINE HCL 50 MG PO TABS: 50.0000 mg | ORAL_TABLET | Freq: Three times a day (TID) | ORAL | Status: DC | PRN

## 2014-05-12 MED ORDER — TRIAMTERENE-HCTZ 37.5-25 MG PO TABS
1.0000 | ORAL_TABLET | Freq: Every day | ORAL | Status: DC
  Administered 2014-05-12 – 2014-05-13 (×2): 1 via ORAL
  Filled 2014-05-12 (×5): qty 1

## 2014-05-12 MED ORDER — AMLODIPINE BESYLATE 5 MG PO TABS: 5.0000 mg | ORAL_TABLET | Freq: Every day | ORAL | Status: DC

## 2014-05-12 MED ORDER — METOPROLOL TARTRATE 12.5 MG HALF TABLET: 12.5000 mg | ORAL_TABLET | Freq: Two times a day (BID) | ORAL | Status: DC

## 2014-05-12 MED ORDER — HYDRALAZINE HCL 25 MG PO TABS: ORAL_TABLET | ORAL | Status: DC

## 2014-05-12 MED ORDER — FUROSEMIDE 20 MG PO TABS: 20.0000 mg | ORAL_TABLET | Freq: Every day | ORAL | Status: DC

## 2014-05-12 MED ORDER — MIRTAZAPINE 15 MG PO TABS: 15.0000 mg | ORAL_TABLET | Freq: Every day | ORAL | Status: DC

## 2014-05-12 MED ORDER — NIFEDIPINE ER 30 MG PO TB24
30.0000 mg | ORAL_TABLET | Freq: Every day | ORAL | Status: DC
  Administered 2014-05-12 – 2014-05-13 (×2): 30 mg via ORAL
  Filled 2014-05-12 (×4): qty 1

## 2014-05-12 NOTE — Progress Notes (Signed)
Patient b/p elevated this AM see VS in doc flow.  Gabriel Ashley Notified and patient to get 0800 early.  B/P medications administered.

## 2014-05-12 NOTE — Clinical Social Work Note (Signed)
CSW spoke with Gabriel Ashley in [employer] office regarding duty to warn.  She was informed we do not have patient's girlfriend's name or contact information.  She was also informed patient has advised of having a court date tomorrow regarding a 50B he has against girlfriend.  Beth to explore if we can use the information to obtain contact information to warn girlfriend patient had HI at time of admission but not endorsing at this time.  Awaiting call back from [place].
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
## 2014-05-12 NOTE — Progress Notes (Addendum)
D:  Patient's self inventory sheet, patient denied SI  and HI while talking with nurse today.  Denied A/V hallucinations.   A:  Medications administered per MD orders.  Emotional support and encouragement given patient. R:  Safety maintained with 15 minute checks.  Stated he had fair sleep last night with sleep medication, good appetite, low energy level, poor concentration.  Rated depression and anxiety 5, hopeless 4.  Denied withdrawals.  Denied SI.  Has experienced pain.

## 2014-05-12 NOTE — Tx Team (Addendum)
Interdisciplinary Treatment Plan Update   Date Reviewed:  05/12/2014  Time Reviewed:  8:52 AM  Progress in Treatment:   Attending groups: Yes Participating in groups: Yes Taking medication as prescribed: Yes  Tolerating medication: Yes Family/Significant other contact made:  No, patient declines collateral contact Patient understands diagnosis: Yes  Discussing patient identified problems/goals with staff: Yes Medical problems stabilized or resolved: Yes Denies suicidal/homicidal ideation: Yes Patient has not harmed self or others: Yes  For review of initial/current patient goals, please see plan of care.  Estimated Length of Stay:  1 day  Reasons for Continued Hospitalization:  Anxiety Depression Medication stabilization   New Problems/Goals identified:    Discharge Plan or Barriers:   Home with outpatient follow up [facility]   Additional Comments:  Patient attended treatment team and duty to warn was discussed as patient was HI toward girlfriend on admission. He stated he came into the hospital to get his life together and that if he had wanted to hurt her, he would not have admitted to the hospital.  Patient would not provide name and contant information for girlfriend.  Patient is not endorsing HI at this time but it is believed that duty to warn is needed.   Attendees:  Patient:  Gabriel Ashley 05/12/2014 8:52 AM   Signature:  Gabriel Earing, MD 05/12/2014 8:52 AM  Signature:  Agustina Caroli, NP  05/12/2014 8:52 AM  Signature:  Satira Sark, RN 05/12/2014 8:52 AM  Signature:  Thurnell Garbe, RN 05/12/2014 8:52 AM  Signature:  Grayland Ormond, RN 05/12/2014 8:52 AM  Signature:  Joette Catching, LCSW 05/12/2014 8:52 AM  Signature:  Tilden Fossa, LCSW-A 05/12/2014 8:52 AM  Signature:  Lucinda Dell, Care Coordinator [facility] 05/12/2014 8:52 AM  Signature:  05/12/2014 8:52 AM  Signature: Lars Pinks, RN [facility] 05/12/2014  8:52 AM  Signature:    05/12/2014  8:52 AM  Signature:  05/12/2014  8:52 AM    Scribe for Treatment Team:   Joette Catching,  05/12/2014 8:52 AM

## 2014-05-12 NOTE — Progress Notes (Signed)
Patient ID: Gabriel Ashley, male   DOB: 1965/11/10, 48 y.o.   MRN: [id number] I have met with patient. He is approaching discharge and has been wanting to leave early in AM on 8/27 as he states he has court date in AM. He states, however,  he has noted that there is no court date tomorrow, and that the actual court date is on September. He continues to state he is not suicidal or homicidal , and that he plans to go to court as requested and resolve this issue via legal pathway. He has been seen as outpatient at [facility] in [place] , but admits he has not seen psychiatrist or PCP " in a long time"  and would like assistance with making appointments if possible. As per above, he states he does not need to leave as early as he expected, and I have gone ahead and cancelled early discharge, to allow further disposition planning if needed tomorrow.  Patient in agreement.

## 2014-05-12 NOTE — Progress Notes (Signed)
Adult Psychoeducational Group Note  Date:  05/12/2014 Time:  9:58 PM  Group Topic/Focus:  Wrap-Up Group:   The focus of this group is to help patients review their daily goal of treatment and discuss progress on daily workbooks.  Participation Level:  Active  Participation Quality:  Appropriate and Attentive  Affect:  Appropriate  Cognitive:  Alert and Appropriate  Insight: Appropriate  Engagement in Group:  Engaged  Modes of Intervention:  Activity  Additional Comments:  Pt was in group and engaged in the conversation   Baylin Cabal R 05/12/2014, 9:58 PM

## 2014-05-12 NOTE — Progress Notes (Signed)
Recreation Therapy Notes  Animal-Assisted Activity/Therapy (AAA/T) Program Checklist/Progress Notes Patient Eligibility Criteria Checklist & Daily Group note for Rec Tx Intervention  Date: 08.25.2015 Time: 2:45pm Location: [address]   AAA/T Program Assumption of Risk Form signed by Patient/ or Parent Legal Guardian yes  Patient is free of allergies or sever asthma yes  Patient reports no fear of animals yes  Patient reports no history of cruelty to animals yes   Patient understands his/her participation is voluntary yes  Patient washes hands before animal contact yes  Patient washes hands after animal contact yes  Behavioral Response: Appropriate   Education: Hand Washing, Appropriate Animal Interaction   Education Outcome: Acknowledges understanding   Clinical Observations/Feedback: Patient attended and participated appropriately with therapeutic dog team.   Lane Hacker, LRT/CTRS  Ritta Hammes L 05/12/2014 4:01 PM
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
## 2014-05-12 NOTE — BHH Suicide Risk Assessment (Addendum)
Demographic Factors:  48 year old male, single, has an adult son, is employed  Total Time spent with patient: 30 minutes  Psychiatric Specialty Exam: Physical Exam  ROS  Blood pressure 156/97, pulse 61, temperature 98 F (36.7 C), temperature source Oral, resp. rate 20, height 5\' 10"  (1.778 m), weight 106.595 kg (235 lb), SpO2 99.00%.Body mass index is 33.72 kg/(m^2).  General Appearance: Well Groomed  Engineer, water::  Good  Speech:  Normal Rate  Volume:  Normal  Mood:  improved mood and affect  Affect:  Appropriate  Thought Process:  Goal Directed and Linear  Orientation:  Full (Time, Place, and Person)  Thought Content:  denies hallucinations, no delusions  Suicidal Thoughts:  No  Homicidal Thoughts:  No- denies any thoughts of hurting anyone to include his girlfriend. States he plans to seek resolution of their issues via court/law, and plans to " focus on my own life, not on any relationship stuff"  Memory:  NA  Judgement:  Good  Insight:  Fair  Psychomotor Activity:  Normal  Concentration:  Good  Recall:  Good  Fund of Knowledge:Good  Language: Good  Akathisia:  Negative  Handed:  Right  AIMS (if indicated):     Assets:  Communication Skills Desire for Improvement Resilience  Sleep:  Number of Hours: 6.25    Musculoskeletal: Strength & Muscle Tone: within normal limits Gait & Station: normal Patient leans: N/A   Mental Status Per Nursing Assessment::   On Admission:     Current Mental Status by Physician: At this time patient is alert and attentive, well related, calm, mood is "OK", denies depression, affect is appropriate, no thought disorder, not suicidal , not homicidal , denies any plan or intention  Of hurting his girflfriend or anyone else, and no hallucinations, no delusions. Currently future oriented, wants to go back to work and start college studies soon.  Loss Factors: Loss of significant relationship and Legal issues- recent break up with  girlfriend, and recently she took out an order of protection against him. Has court date tomorrow to address.  Historical Factors: History of PTSD which has improved overtime, denies history of violence, or any history of suicide attempts  Risk Reduction Factors:   Sense of responsibility to family, Employed and Positive coping skills or problem solving skills  Continued Clinical Symptoms:  At this time denies depression, denies ongoing anger/ruminations about losses/issues as above.  Cognitive Features That Contribute To Risk:  No gross cognitive deficits noted  Suicide Risk:  Mild:  Suicidal ideation of limited frequency, intensity, duration, and specificity.  There are no identifiable plans, no associated intent, mild dysphoria and related symptoms, good self-control (both objective and subjective assessment), few other risk factors, and identifiable protective factors, including available and accessible social support.  Discharge Diagnoses:   AXIS I:  Depressive Disorder NOS, Substance Abuse and Substance Induced Mood Disorder AXIS II:  Deferred AXIS III:   Past Medical History  Diagnosis Date  . Hypertension   . Anxiety   . Asthma    AXIS IV:  problems related to social environment AXIS V:  51-60 moderate symptoms  Plan Of Care/Follow-up recommendations:  Activity:  as tolerated Diet:  low sodium Tests:  NA Other:  See below  Is patient on multiple antipsychotic therapies at discharge:  No   Has Patient had three or more failed trials of antipsychotic monotherapy by history:  No  Recommended Plan for Multiple Antipsychotic Therapies: NA  [facility] , and states he plans to follow up there. He also has an established PCP at the [place].  I have stressed importance of following up not only with psychiatrist but also with PCP regarding HTN , monitorization. Encouraged to attend AA or NA on a regular basis.  COBOS,  FERNANDO 05/12/2014, 4:55 PM  Addendum- 8/26 9, 15 AM- patient being discharged from unit in good spirits. MSE unchanged from above. Disposition plan has changed - patient will follow up with Dr. Roena Malady at [facility] , with appt next week. Patient encouraged to follow up promptly with his PCP regarding medical management of HTN.

## 2014-05-12 NOTE — Progress Notes (Signed)
The focus of this group is to educate the patient on the purpose and policies of crisis stabilization and provide a format to answer questions about their admission.  The group details unit policies and expectations of patients while admitted.  Patient attended 0900 nurse education orientation group this morning.  Patient actively participated, appropriate affect, alert, appropriate insight and engagement.  Today patient will work on 3 goals for discharge.  

## 2014-05-12 NOTE — BHH Group Notes (Signed)
[facility] Group Therapy      Feelings About Diagnosis 1:15 - 2:30 PM         05/12/2014    Type of Therapy:  Group Therapy  Participation Level:  Active  Participation Quality:  Appropriate  Affect:  Appropriate  Cognitive:  Alert and Appropriate  Insight:  Developing/Improving and Engaged  Engagement in Therapy:  Developing/Improving and Engaged  Modes of Intervention:  Discussion, Education, Exploration, Problem-Solving, Rapport Building, Support  Summary of Progress/Problems:  Patient actively participated in group. Patient discussed past and present diagnosis and the effects it has had on  life.  Patient talked about family and society being judgmental and the stigma associated with having a mental health diagnosis.  Patient shared he is okay with his diagnosis.  He shared he had PTSD for years without knowing what was wrong with him.  He shared from the time he was diagnosed he began working on recovery.    Concha Pyo 05/12/2014

## 2014-05-13 DIAGNOSIS — F332 Major depressive disorder, recurrent severe without psychotic features: Principal | ICD-10-CM

## 2014-05-13 MED ORDER — HYDRALAZINE HCL 50 MG PO TABS: 50.0000 mg | ORAL_TABLET | Freq: Three times a day (TID) | ORAL | Status: DC

## 2014-05-13 MED ORDER — TRIAMTERENE-HCTZ 37.5-25 MG PO TABS: 1.0000 | ORAL_TABLET | Freq: Every day | ORAL | Status: DC

## 2014-05-13 MED ORDER — NIFEDIPINE ER 30 MG PO TB24: 30.0000 mg | ORAL_TABLET | Freq: Every day | ORAL | Status: DC
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
## 2014-05-13 NOTE — Progress Notes (Signed)
Patient ID: Gabriel Ashley, male   DOB: [birth year], 48 y.o.   MRN: [id number] Late Entry for 09:15:  He has ben discharged and left via taxi to go to court.  He voiced understanding of discharge instruction and of follow up care plan. He denies SI and HI, all belongings were taken with him.  He was calm and cooperative and patient.

## 2014-05-13 NOTE — Progress Notes (Signed)
Pt reports he is doing ok.  He is asking about his discharge and wants to know if he is being discharged Wednesday and what time.  Informed pt that no formal discharge had been written, but that his chart was in the discharge cabinet and that the RN who gave report said that he would possibly be discharged tomorrow.  Explained to pt that writer did not have any more information than that.  Pt denies SI/HI/AV to this Probation officer.  Pt denies any withdrawal symptoms or cravings this evening.  Pt attended evening group tonight.  Pt has been polite and appropriate on the unit.  Pt makes his needs known to staff.  Support and encouragement offered.  Safety maintained with q15 minute checks.

## 2014-05-13 NOTE — Progress Notes (Signed)
[facility] Adult Case Management Discharge Plan :  Will you be returning to the same living situation after discharge:  No.  Patient discharging home with friends. At discharge, do you have transportation home?:Yes,  Patient assisted with taxi voucher. Do you have the ability to pay for your medications:Yes,  Patient has ability to obtain medications.  Release of information consent forms completed and in the chart;  Patient's signature needed at discharge.  Patient to Follow up at: Follow-up Information   Follow up with Dr. Doyne Keel - [facility] On 05/19/2014. (Tuesday, May 19, 2014 at 1:30 PM.  Please arrive at 1:00 PM with completed registration packet)    Contact information:   [place], [place]   [address]  [phone]      Patient denies SI/HI: Patient no longer endorsing SI/HI or other thoughts of self harm.   Safety Planning and Suicide Prevention discussed: .Reviewed with all patients during discharge planning group  Aminata Buffalo, Eulas Post 05/13/2014, 11:40 AM
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
## 2014-05-18 NOTE — Progress Notes (Signed)
Patient Discharge Instructions:  Next Level Care Provider Has Access to the EMR, 05/18/14 Records provided to [facility] via [facility]/Epic access.  Gabriel Ashley, 05/18/2014, 1:05 PM

## 2014-05-19 ENCOUNTER — Ambulatory Visit (HOSPITAL_COMMUNITY): Payer: Self-pay | Admitting: Psychiatry

## 2014-06-03 NOTE — Progress Notes (Signed)
Case discussed with NP.  Chart reviewed. Agree with above assessment and plan.  Mayerly Kaman, MD 

## 2014-10-22 ENCOUNTER — Emergency Department (HOSPITAL_COMMUNITY)
Admission: EM | Admit: 2014-10-22 | Discharge: 2014-10-23 | Disposition: A | Discharge status: Other Acute Inpatient Hospital | Payer: Non-veteran care | Attending: Emergency Medicine | Admitting: Emergency Medicine

## 2014-10-22 ENCOUNTER — Emergency Department (HOSPITAL_COMMUNITY): Payer: Non-veteran care

## 2014-10-22 ENCOUNTER — Encounter (HOSPITAL_COMMUNITY): Payer: Self-pay | Admitting: Emergency Medicine

## 2014-10-22 DIAGNOSIS — Z79899 Other long term (current) drug therapy: Secondary | ICD-10-CM | POA: Insufficient documentation

## 2014-10-22 DIAGNOSIS — F4310 Post-traumatic stress disorder, unspecified: Secondary | ICD-10-CM | POA: Insufficient documentation

## 2014-10-22 DIAGNOSIS — F332 Major depressive disorder, recurrent severe without psychotic features: Secondary | ICD-10-CM | POA: Diagnosis present

## 2014-10-22 DIAGNOSIS — F1410 Cocaine abuse, uncomplicated: Secondary | ICD-10-CM | POA: Insufficient documentation

## 2014-10-22 DIAGNOSIS — J45909 Unspecified asthma, uncomplicated: Secondary | ICD-10-CM | POA: Insufficient documentation

## 2014-10-22 DIAGNOSIS — I10 Essential (primary) hypertension: Secondary | ICD-10-CM | POA: Insufficient documentation

## 2014-10-22 DIAGNOSIS — F1210 Cannabis abuse, uncomplicated: Secondary | ICD-10-CM | POA: Insufficient documentation

## 2014-10-22 DIAGNOSIS — R45851 Suicidal ideations: Secondary | ICD-10-CM

## 2014-10-22 DIAGNOSIS — Z720 Tobacco use: Secondary | ICD-10-CM | POA: Insufficient documentation

## 2014-10-22 DIAGNOSIS — F419 Anxiety disorder, unspecified: Secondary | ICD-10-CM | POA: Insufficient documentation

## 2014-10-22 LAB — RAPID URINE DRUG SCREEN, HOSP PERFORMED
AMPHETAMINES: NOT DETECTED
BARBITURATES: NOT DETECTED
BENZODIAZEPINES: NOT DETECTED
Cocaine: POSITIVE — AB
OPIATES: NOT DETECTED
TETRAHYDROCANNABINOL: POSITIVE — AB

## 2014-10-22 LAB — COMPREHENSIVE METABOLIC PANEL
ALK PHOS: 84 U/L (ref 39–117)
ALT: 16 U/L (ref 0–53)
ANION GAP: 10 (ref 5–15)
AST: 26 U/L (ref 0–37)
Albumin: 4.2 g/dL (ref 3.5–5.2)
BUN: 18 mg/dL (ref 6–23)
CALCIUM: 9 mg/dL (ref 8.4–10.5)
CO2: 24 mmol/L (ref 19–32)
Chloride: 105 mmol/L (ref 96–112)
Creatinine, Ser: 2.02 mg/dL — ABNORMAL HIGH (ref 0.50–1.35)
GFR calc Af Amer: 43 mL/min — ABNORMAL LOW (ref >90)
GFR calc non Af Amer: 37 mL/min — ABNORMAL LOW (ref >90)
Glucose, Bld: 144 mg/dL — ABNORMAL HIGH (ref 70–99)
Potassium: 3.1 mmol/L — ABNORMAL LOW (ref 3.5–5.1)
Sodium: 139 mmol/L (ref 135–145)
Total Bilirubin: 0.6 mg/dL (ref 0.3–1.2)
Total Protein: 7.9 g/dL (ref 6.0–8.3)

## 2014-10-22 LAB — CBC
HEMATOCRIT: 38.7 % — AB (ref 39.0–52.0)
HEMOGLOBIN: 13 g/dL (ref 13.0–17.0)
MCH: 28.8 pg (ref 26.0–34.0)
MCHC: 33.6 g/dL (ref 30.0–36.0)
MCV: 85.6 fL (ref 78.0–100.0)
Platelets: 257 10*3/uL (ref 150–400)
RBC: 4.52 MIL/uL (ref 4.22–5.81)
RDW: 15.5 % (ref 11.5–15.5)
WBC: 8.1 10*3/uL (ref 4.0–10.5)

## 2014-10-22 LAB — URINALYSIS, ROUTINE W REFLEX MICROSCOPIC
Bilirubin Urine: NEGATIVE
Glucose, UA: NEGATIVE mg/dL
KETONES UR: NEGATIVE mg/dL
Leukocytes, UA: NEGATIVE
NITRITE: NEGATIVE
PH: 5.5 (ref 5.0–8.0)
Protein, ur: 100 mg/dL — AB
Specific Gravity, Urine: 1.021 (ref 1.005–1.030)
UROBILINOGEN UA: 0.2 mg/dL (ref 0.0–1.0)

## 2014-10-22 LAB — ETHANOL

## 2014-10-22 LAB — URINE MICROSCOPIC-ADD ON

## 2014-10-22 LAB — SALICYLATE LEVEL: Salicylate Lvl: <4 mg/dL (ref 2.8–20.0)

## 2014-10-22 LAB — ACETAMINOPHEN LEVEL: Acetaminophen (Tylenol), Serum: <10 ug/mL — ABNORMAL LOW (ref 10–30)

## 2014-10-22 MED ORDER — TRIAMTERENE-HCTZ 37.5-25 MG PO TABS
1.0000 | ORAL_TABLET | Freq: Every day | ORAL | Status: DC
  Administered 2014-10-22 – 2014-10-23 (×2): 1 via ORAL
  Filled 2014-10-22 (×2): qty 1

## 2014-10-22 MED ORDER — ACETAMINOPHEN 325 MG PO TABS
650.0000 mg | ORAL_TABLET | Freq: Once | ORAL | Status: AC
  Administered 2014-10-22: 650 mg via ORAL
  Filled 2014-10-22: qty 2

## 2014-10-22 MED ORDER — METOPROLOL TARTRATE 25 MG PO TABS
50.0000 mg | ORAL_TABLET | Freq: Once | ORAL | Status: AC
  Administered 2014-10-22: 50 mg via ORAL
  Filled 2014-10-22: qty 2

## 2014-10-22 MED ORDER — METOPROLOL TARTRATE 25 MG PO TABS
50.0000 mg | ORAL_TABLET | Freq: Two times a day (BID) | ORAL | Status: DC
  Administered 2014-10-22 – 2014-10-23 (×2): 50 mg via ORAL
  Filled 2014-10-22 (×2): qty 2

## 2014-10-22 MED ORDER — TRIAMTERENE-HCTZ 37.5-25 MG PO TABS: 1.0000 | ORAL_TABLET | Freq: Every day | ORAL | Status: DC

## 2014-10-22 NOTE — ED Notes (Signed)
Sitter escorted patient to the bathroom.

## 2014-10-22 NOTE — Consult Note (Signed)
[facility] Psychiatry Consult   Reason for Consult:  Major depressive disorder, Substance abuse Referring Physician:  EDP Patient Identification: Gabriel Ashley MRN:  [id number] Principal Diagnosis: Severe recurrent major depression without psychotic features Diagnosis:   Patient Active Problem List   Diagnosis Date Noted  . Severe recurrent major depression without psychotic features [F33.2] 05/07/2014    Priority: High  . Essential hypertension, benign [I10] 05/11/2014  . Elevated serum creatinine [R74.8] 05/08/2014  . Depressive disorder [F32.9] 05/07/2014  . Suicidal ideation [R45.851] 05/07/2014  . Homicidal ideation [R45.850] 05/07/2014  . Anxiety [F41.9] 05/07/2014    Total Time spent with patient: 45 minutes  Subjective:   Gabriel Ashley is a 49 y.o. male patient admitted with Major depression, substance   HPI: AA male, 49 years old was evaluated for increased depression and relapse on Alcohol, Cocaine and Marijuana.  Patient was last admitted at our inpatient Psychiatric unit last April for major depression and substance abuse.  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  Patient reported that he feels depressed and rated his depression 8/10 with 10 being severe depression.  He reports a hx of PTSD during his service and stated that he does have symptoms that ranges from anxiety, insomnia, flash backs and irritability.  He reports feeling suicidal and cannot contract for safety.  Patient reported that he has not been compliant with his medications lately.  He is angry that he relapsed on Alcohol, cocaine and Marijuana.  Patient reports drinking a couple of beers daily and his last drink was yesterday.  He smokes 2 blunts of Mariajuana daily and uses about a GM of Cocaine daily and all were used last yesterday.  Patient denies previous suicide  attempt but stated that he is not safe to go home.  He denies weapon in the  house but stated that his friends does have one to use if he can get hold of the weapon.   He denies A/VH.  Patient have been accepted for admission and we will be looking for any available inpatient Psychiatric bed.  HPI Elements:   Location:  Major depression, Substance abuse. Quality:  Insomnia, feeling suiciidal, angry,. Severity:  severe. Timing:  Acute, relapsed on substances. Duration:  Chronic mental  illness. Context:  Brought self in for feeling suicidal.  Past Medical History:  Past Medical History  Diagnosis Date  . Hypertension   . Anxiety   . Asthma    History reviewed. No pertinent past surgical history. Family History: History reviewed. No pertinent family history. Social History:  History  Alcohol Use  . Yes    Comment: 2 40 oz beers last night     History  Drug Use  . Yes  . Special: Cocaine, Marijuana    History   Social History  . Marital Status: Single    Spouse Name: N/A    Number of Children: N/A  . Years of Education: N/A   Social History Main Topics  . Smoking status: Current Every Day Smoker -- 1.00 packs/day for 31 years  . Smokeless tobacco: None  . Alcohol Use: Yes     Comment: 2 40 oz beers last night  . Drug Use: Yes    Special: Cocaine, Marijuana  . Sexual Activity: None   Other Topics Concern  . None   Social History Narrative   Additional Social History:    History of alcohol / drug use?: Yes  Negative Consequences of Use: Financial, Personal relationships, Work / School Withdrawal Symptoms: Irritability, Patient aware of relationship between substance abuse and physical/medical complications, Tingling, Agitation, Sweats, Change in blood pressure Name of Substance 1: Alcohol 1 - Age of First Use: 8 1 - Amount (size/oz): 2 (40oz) 1 - Frequency: Daily 1 - Duration: 7 years 1 - Last Use / Amount: 2 (40oz) Beer                    Allergies:  No Known Allergies  Vitals: Blood pressure 169/96, pulse 65, temperature  97.7 F (36.5 C), temperature source Oral, resp. rate 16, SpO2 99 %.  Risk to Self: Suicidal Ideation: Yes-Currently Present Suicidal Intent: Yes-Currently Present Is patient at risk for suicide?: Yes Suicidal Plan?: No Access to Means: Yes Specify Access to Suicidal Means: He has access to guns What has been your use of drugs/alcohol within the last 12 months?: Beer How many times?: 2 Other Self Harm Risks: None Reported Triggers for Past Attempts: Other (Comment) (PTSD) Intentional Self Injurious Behavior: None Risk to Others: Homicidal Ideation: No Thoughts of Harm to Others: No Current Homicidal Intent: No Current Homicidal Plan: No Access to Homicidal Means: No Identified Victim: None Reported History of harm to others?: No Assessment of Violence: None Noted Violent Behavior Description: None Reported Does patient have access to weapons?: Yes (Comment) Criminal Charges Pending?: No Does patient have a court date: No Prior Inpatient Therapy: Prior Inpatient Therapy: Yes Prior Therapy Dates: August 2015 and 2010 Prior Therapy Facilty/Provider[facility]): [facility] and [facility]  Reason for Treatment: SI Prior Outpatient Therapy: Prior Outpatient Therapy: Yes Prior Therapy Dates: Ongoing  Prior Therapy Facilty/Provider(s): [facility]  Reason for Treatment: Medication Management   Current Facility-Administered Medications  Medication Dose Route Frequency Provider Last Rate Last Dose  . triamterene-hydrochlorothiazide (MAXZIDE-25) 37.5-25 MG per tablet 1 tablet  1 tablet Oral Daily Fredia Sorrow, MD   1 tablet at 10/22/14 1036   Current Outpatient Prescriptions  Medication Sig Dispense Refill  . albuterol (PROVENTIL HFA;VENTOLIN HFA) 108 (90 BASE) MCG/ACT inhaler Inhale 1-2 puffs into the lungs every 4 (four) hours as needed for wheezing or shortness of breath.    . hydrOXYzine (ATARAX/VISTARIL) 50 MG tablet Take 1 tablet (50 mg total) by mouth 3 (three) times daily as needed  for anxiety. 45 tablet 0  . mirtazapine (REMERON) 15 MG tablet Take 1 tablet (15 mg total) by mouth at bedtime. For sleep 30 tablet 0  . traZODone (DESYREL) 150 MG tablet Take 150 mg by mouth at bedtime as needed for sleep.    Marland Kitchen amLODipine (NORVASC) 10 MG tablet Take 10 mg by mouth daily.    Marland Kitchen atenolol (TENORMIN) 25 MG tablet Take 25 mg by mouth 3 (three) times daily.    . hydrALAZINE (APRESOLINE) 50 MG tablet Take 1 tablet (50 mg total) by mouth every 8 (eight) hours. For high blood pressure (Patient not taking: Reported on 10/22/2014) 90 tablet 0  . metoprolol tartrate (LOPRESSOR) 12.5 mg TABS tablet Take 0.5 tablets (12.5 mg total) by mouth 2 (two) times daily. For high blood pressure (Patient not taking: Reported on 10/22/2014) 60 tablet 0  . NIFEdipine (PROCARDIA-XL/ADALAT CC) 30 MG 24 hr tablet Take 1 tablet (30 mg total) by mouth daily. For high blood pressure (Patient not taking: Reported on 10/22/2014) 30 tablet 0  . triamterene-hydrochlorothiazide (MAXZIDE-25) 37.5-25 MG per tablet Take 1 tablet by mouth daily. For high blood pressure (Patient not taking: Reported on  10/22/2014) 30 tablet 0    Musculoskeletal: Strength & Muscle Tone: within normal limits Gait & Station: normal Patient leans: N/A  Psychiatric Specialty Exam:     Blood pressure 169/96, pulse 65, temperature 97.7 F (36.5 C), temperature source Oral, resp. rate 16, SpO2 99 %.There is no weight on file to calculate BMI.  General Appearance: Casual  Eye Contact::  Good  Speech:  Clear and Coherent and Normal Rate  Volume:  Normal  Mood:  Angry, Anxious, Depressed and Hopeless  Affect:  Congruent  Thought Process:  Coherent, Goal Directed and Intact  Orientation:  Full (Time, Place, and Person)  Thought Content:  WDL  Suicidal Thoughts:  Yes.  without intent/plan  Homicidal Thoughts:  No  Memory:  Immediate;   Good Recent;   Good Remote;   Good  Judgement:  Poor  Insight:  Shallow  Psychomotor Activity:  Normal   Concentration:  Good  Recall:  NA  Fund of Knowledge:Fair  Language: Good  Akathisia:  NA  Handed:  Right  AIMS (if indicated):     Assets:  Desire for Improvement  ADL's:  Intact  Cognition: WNL  Sleep:      Medical Decision Making: Review and summation of old records (2), Established Problem, Worsening (2), Review of Medication Regimen & Side Effects (2) and Review of New Medication or Change in Dosage (2)  Treatment Plan Summary: Daily contact with patient to assess and evaluate symptoms and progress in treatment, Medication management and Plan Admit and wait for bed availability  Plan:  Recommend psychiatric Inpatient admission when medically cleared. Admit patient  Disposition: Gabriel Ashley, C   PMHNP-BC 10/22/2014 5:00 PM

## 2014-10-22 NOTE — Progress Notes (Signed)
pcp is [facility] [place], [place], [place] [address] :[phone]

## 2014-10-22 NOTE — ED Notes (Addendum)
Pt was concerned about a missing cell phone.  Security found phone, in his shoe, where they had placed it.  RN notified and will inform the Pt.

## 2014-10-22 NOTE — ED Notes (Signed)
Patient accompanied by the sitter to the bathroom.

## 2014-10-22 NOTE — Progress Notes (Signed)
Cm spoke with pt about pcp He reports going to [facility]. Pt inquired about going to [facility] behavioral health.  Discussed with ED SW  Pt inquired about his chs bill CM consulted with ED registration staff who provided contact information for pt  Pt provided with [facility] heatlh system patient Pensions consultant Building [address] [place], [place] [address] [phone]  and placed inpt d/c follow up information

## 2014-10-22 NOTE — BH Assessment (Addendum)
Assessment Note   Gabriel Ashley is an 49 y.o. male that reports SI.  Patient reports that, "I am thinking of ways to kill my self right now".  Patient reports that he is not able to contract for safety.  Patient reports that he does have access to guns because he was in the [place] and all of his friends have guns.  Patient reports that he lives with his son and there are no guns at his sons home.    Patient reports that when he was discharged from the [facility] he was diagnosed with PTSD and he is now receiving disability.    Patient reports a past history of psychiatric hospitalization at [facility] in August 2015.  Patient reports that he was is non compliant with taking his medication from his outpatient psychiatrist at the [facility] facility.    Patient reports a history of substance abuse.  Patient reports that he drinks 2 (40oz) beer daily for the past 7 years.  Patient denies using any other drugs; however his BAL is <5 and his UDS is positive for cocaine.  Patient reports minimal withdrawal symptoms.  Patient reports that his longest period of sobriety was for 2 and a half years.   Patient reports that he is here for his depression and suicidal ideation not his substance abuse.  Patient reports previous detox at the [facility] in 2010.   Patient denies physical, sexual or emotional abuse.       Axis I: Bipolar, Depressed Substance Abuse Axis II: Deferred Axis III:  Past Medical History  Diagnosis Date  . Hypertension   . Anxiety   . Asthma    Axis IV: economic problems, housing problems, occupational problems, other psychosocial or environmental problems, problems related to social environment, problems with access to health care services and problems with primary support group Axis V: 31-40 impairment in reality testing  Past Medical History:  Past Medical History  Diagnosis Date  . Hypertension   . Anxiety   . Asthma     History reviewed. No pertinent past surgical  history.  Family History: History reviewed. No pertinent family history.  Social History:  reports that he has been smoking.  He does not have any smokeless tobacco history on file. He reports that he drinks alcohol. He reports that he uses illicit drugs (Cocaine and Marijuana).  Additional Social History:  Alcohol / Drug Use History of alcohol / drug use?: Yes Negative Consequences of Use: Financial, Personal relationships, Work / School Withdrawal Symptoms: Irritability, Patient aware of relationship between substance abuse and physical/medical complications, Tingling, Agitation, Sweats, Change in blood pressure Substance #1 Name of Substance 1: Alcohol 1 - Age of First Use: 8 1 - Amount (size/oz): 2 (40oz) 1 - Frequency: Daily 1 - Duration: 7 years 1 - Last Use / Amount: 2 (40oz) Beer   CIWA: CIWA-Ar BP: (!) 199/106 mmHg Pulse Rate: 73 Nausea and Vomiting: no nausea and no vomiting Tactile Disturbances: very mild itching, pins and needles, burning or numbness Tremor: no tremor Auditory Disturbances: not present Paroxysmal Sweats: barely perceptible sweating, palms moist Visual Disturbances: not present Anxiety: mildly anxious Headache, Fullness in Head: very mild Agitation: somewhat more than normal activity Orientation and Clouding of Sensorium: oriented and can do serial additions CIWA-Ar Total: 5 COWS:    PATIENT STRENGTHS: (choose at least two) Ability for insight Average or above average intelligence Communication skills Financial means  Allergies: No Known Allergies  Home Medications:  (Not in a hospital  admission)  OB/GYN Status:  No LMP for male patient.  General Assessment Data Location of Assessment: [facility] Is this a Tele or Face-to-Face Assessment?: Face-to-Face Is this an Initial Assessment or a Re-assessment for this encounter?: Initial Assessment Living Arrangements:  (LIves with his son) Can pt return to current living arrangement?: Yes Admission  Status: Voluntary Is patient capable of signing voluntary admission?: Yes Transfer from: Home Referral Source: Self/Family/Friend  Medical Screening Exam ([place]) Medical Exam completed: Yes  [place] Living Arrangements:  (LIves with his son) Name of Psychiatrist: Dr. Nelva Bush  [facility]) Name of Therapist: None Reported  Education Status Is patient currently in school?: No Current Grade: NA Highest grade of school patient has completed: NA Name of school: NA Contact person: NA  Risk to self with the past 6 months Suicidal Ideation: Yes-Currently Present Suicidal Intent: Yes-Currently Present Is patient at risk for suicide?: Yes Suicidal Plan?: No Access to Means: Yes Specify Access to Suicidal Means: He has access to guns What has been your use of drugs/alcohol within the last 12 months?: Beer Previous Attempts/Gestures: Yes How many times?: 2 Other Self Harm Risks: None Reported Triggers for Past Attempts: Other (Comment) (PTSD) Intentional Self Injurious Behavior: None Family Suicide History: No Recent stressful life event(s): Conflict (Comment), Financial Problems, Job Loss Persecutory voices/beliefs?: No Depression: Yes Depression Symptoms: Despondent, Insomnia, Fatigue, Guilt, Loss of interest in usual pleasures, Feeling angry/irritable, Feeling worthless/self pity Substance abuse history and/or treatment for substance abuse?: Yes Suicide prevention information given to non-admitted patients: Yes  Risk to Others within the past 6 months Homicidal Ideation: No Thoughts of Harm to Others: No Current Homicidal Intent: No Current Homicidal Plan: No Access to Homicidal Means: No Identified Victim: None Reported History of harm to others?: No Assessment of Violence: None Noted Violent Behavior Description: None Reported Does patient have access to weapons?: Yes (Comment) Criminal Charges Pending?: No Does patient have a court date:  No  Psychosis Hallucinations: None noted Delusions: None noted  Mental Status Report Appear/Hygiene: Disheveled Eye Contact: Fair Motor Activity: Freedom of movement Speech: Logical/coherent Level of Consciousness: Alert, Restless Mood: Depressed Affect: Depressed, Blunted Anxiety Level: Minimal Thought Processes: Coherent, Relevant Judgement: Unimpaired Orientation: Person, Place, Time, Situation Obsessive Compulsive Thoughts/Behaviors: None  Cognitive Functioning Concentration: Normal Memory: Recent Intact, Remote Intact IQ: Average Insight: Fair Impulse Control: Poor Appetite: Fair Weight Loss: 0 Weight Gain: 0 Sleep: Decreased Total Hours of Sleep: 3 Vegetative Symptoms: None  ADLScreening [facility] Assessment Services) Patient's cognitive ability adequate to safely complete daily activities?: Yes Patient able to express need for assistance with ADLs?: Yes Independently performs ADLs?: Yes (appropriate for developmental age)  Prior Inpatient Therapy Prior Inpatient Therapy: Yes Prior Therapy Dates: August 2015 and 2010 Prior Therapy Facilty/Provider(s): [facility] and [facility]  Reason for Treatment: SI  Prior Outpatient Therapy Prior Outpatient Therapy: Yes Prior Therapy Dates: Ongoing  Prior Therapy Facilty/Provider(s): [facility]  Reason for Treatment: Medication Management   ADL Screening (condition at time of admission) Patient's cognitive ability adequate to safely complete daily activities?: Yes Patient able to express need for assistance with ADLs?: Yes Independently performs ADLs?: Yes (appropriate for developmental age)             Regulatory affairs officer (For Healthcare) Does patient have an advance directive?: No    Additional Information 1:1 In Past 12 Months?: No CIRT Risk: No Elopement Risk: No Does patient have medical clearance?: Yes     Disposition: Per Reginold Agent,  patient meets criteria for inpatient hospitalization.  Patient  will be referred to a [facility].   Disposition Initial Assessment Completed for this Encounter: Yes Disposition of Patient: Inpatient treatment program Type of inpatient treatment program: Adult (Per Reginold Agent, pt meets criteria for inpatient hospitalizati)  Graciella Freer [place] 10/22/2014 2:08 PM

## 2014-10-22 NOTE — ED Notes (Signed)
Patient transported to X-ray 

## 2014-10-22 NOTE — Progress Notes (Addendum)
CSW contacted the following VA facilities: [facility] - at capacity but will take a look at referral in the am [facility] - at capacity but will take a look at referral in the am [facility] - at capacity but will take a look at referral in the am. Call Si Raider in the am at [phone] ext.[phone]. [phone]. [facility] - didn't answer. [facility]  - has beds.  Patient has been before at the [facility] [phone]) located in [place]. Psychiatrist can be reached in the morning at ext.0 between (8am-4:30pm) if a pt referral needs to be done.  Will continue to seek placement.  Verlon Setting, [place] Disposition staff 10/22/2014 9:15 PM

## 2014-10-22 NOTE — ED Provider Notes (Signed)
CSN: [id number]     Arrival date & time 10/22/14  [id number] History   First MD Initiated Contact with Patient 10/22/14 [phone]     Chief Complaint  Patient presents with  . Suicidal  . PTSD    . Hypertension     (Consider location/radiation/quality/duration/timing/severity/associated sxs/prior Treatment) Patient is a 49 y.o. male presenting with hypertension. The history is provided by the patient.  Hypertension Pertinent negatives include no chest pain, no headaches and no shortness of breath.   49 year old male with a long-standing history of hypertension off meds since August. Patient also having difficulty with his posterior medic stress disorder. Feeling very anxious. Blood pressure's out-of-control cannot sleep having suicidal thoughts. Patient does admit to use of cocaine for the first time in 3 months recently. Patient denies headache chest pain shortness of breath or any focal neuro deficits.  Past Medical History  Diagnosis Date  . Hypertension   . Anxiety   . Asthma    History reviewed. No pertinent past surgical history. History reviewed. No pertinent family history. History  Substance Use Topics  . Smoking status: Current Every Day Smoker -- 1.00 packs/day for 31 years  . Smokeless tobacco: Not on file  . Alcohol Use: Yes     Comment: 2 40 oz beers last night    Review of Systems  Constitutional: Negative for fever.  HENT: Negative for congestion.   Eyes: Negative for redness.  Respiratory: Negative for shortness of breath.   Cardiovascular: Negative for chest pain.  Genitourinary: Positive for hematuria. Negative for dysuria.  Neurological: Negative for headaches.  Hematological: Does not bruise/bleed easily.  Psychiatric/Behavioral: Positive for suicidal ideas and sleep disturbance. The patient is nervous/anxious.       Allergies  Review of patient's allergies indicates no known allergies.  Home Medications   Prior to Admission medications   Medication Sig  Start Date End Date Taking? Authorizing Provider  albuterol (PROVENTIL HFA;VENTOLIN HFA) 108 (90 BASE) MCG/ACT inhaler Inhale 1-2 puffs into the lungs every 4 (four) hours as needed for wheezing or shortness of breath. 05/12/14  Yes Encarnacion Slates, NP  hydrOXYzine (ATARAX/VISTARIL) 50 MG tablet Take 1 tablet (50 mg total) by mouth 3 (three) times daily as needed for anxiety. 05/12/14  Yes Encarnacion Slates, NP  mirtazapine (REMERON) 15 MG tablet Take 1 tablet (15 mg total) by mouth at bedtime. For sleep 05/12/14  Yes Encarnacion Slates, NP  traZODone (DESYREL) 150 MG tablet Take 150 mg by mouth at bedtime as needed for sleep.   Yes Historical Provider, MD  amLODipine (NORVASC) 10 MG tablet Take 10 mg by mouth daily.    Historical Provider, MD  atenolol (TENORMIN) 25 MG tablet Take 25 mg by mouth 3 (three) times daily.    Historical Provider, MD  hydrALAZINE (APRESOLINE) 50 MG tablet Take 1 tablet (50 mg total) by mouth every 8 (eight) hours. For high blood pressure Patient not taking: Reported on 10/22/2014 05/13/14   Encarnacion Slates, NP  metoprolol tartrate (LOPRESSOR) 12.5 mg TABS tablet Take 0.5 tablets (12.5 mg total) by mouth 2 (two) times daily. For high blood pressure Patient not taking: Reported on 10/22/2014 05/12/14   Encarnacion Slates, NP  NIFEdipine (PROCARDIA-XL/ADALAT CC) 30 MG 24 hr tablet Take 1 tablet (30 mg total) by mouth daily. For high blood pressure Patient not taking: Reported on 10/22/2014 05/13/14   Encarnacion Slates, NP  triamterene-hydrochlorothiazide (MAXZIDE-25) 37.5-25 MG per tablet Take 1 tablet by mouth daily. For  high blood pressure Patient not taking: Reported on 10/22/2014 05/13/14   Encarnacion Slates, NP   BP 169/96 mmHg  Pulse 65  Temp(Src) 97.7 F (36.5 C) (Oral)  Resp 16  SpO2 99% Physical Exam  Constitutional: He is oriented to person, place, and time. He appears well-developed and well-nourished. No distress.  HENT:  Head: Normocephalic and atraumatic.  Mouth/Throat: Oropharynx is clear  and moist.  Eyes: Conjunctivae and EOM are normal. Pupils are equal, round, and reactive to light.  Neck: Normal range of motion.  Cardiovascular: Normal rate, regular rhythm and normal heart sounds.   No murmur heard. Pulmonary/Chest: Effort normal and breath sounds normal. No respiratory distress.  Abdominal: Soft. Bowel sounds are normal. There is no tenderness.  Musculoskeletal: Normal range of motion. He exhibits no edema.  Neurological: He is alert and oriented to person, place, and time. No cranial nerve deficit. He exhibits normal muscle tone. Coordination normal.  Skin: Skin is warm. No rash noted.  Nursing note and vitals reviewed.   ED Course  Procedures (including critical care time) Labs Review Labs Reviewed  ACETAMINOPHEN LEVEL - Abnormal; Notable for the following:    Acetaminophen (Tylenol), Serum <10.0 (*)    All other components within normal limits  CBC - Abnormal; Notable for the following:    HCT 38.7 (*)    All other components within normal limits  URINE RAPID DRUG SCREEN (HOSP PERFORMED) - Abnormal; Notable for the following:    Cocaine POSITIVE (*)    Tetrahydrocannabinol POSITIVE (*)    All other components within normal limits  COMPREHENSIVE METABOLIC PANEL - Abnormal; Notable for the following:    Potassium 3.1 (*)    Glucose, Bld 144 (*)    Creatinine, Ser 2.02 (*)    GFR calc non Af Amer 37 (*)    GFR calc Af Amer 43 (*)    All other components within normal limits  URINALYSIS, ROUTINE W REFLEX MICROSCOPIC - Abnormal; Notable for the following:    Hgb urine dipstick SMALL (*)    Protein, ur 100 (*)    All other components within normal limits  URINE MICROSCOPIC-ADD ON - Abnormal; Notable for the following:    Casts HYALINE CASTS (*)    Crystals CA OXALATE CRYSTALS (*)    All other components within normal limits  ETHANOL  SALICYLATE LEVEL   Results for orders placed or performed during the hospital encounter of 10/22/14  Acetaminophen level   Result Value Ref Range   Acetaminophen (Tylenol), Serum <10.0 (L) 10 - 30 ug/mL  CBC  Result Value Ref Range   WBC 8.1 4.0 - 10.5 K/uL   RBC 4.52 4.22 - 5.81 MIL/uL   Hemoglobin 13.0 13.0 - 17.0 g/dL   HCT 38.7 (L) 39.0 - 52.0 %   MCV 85.6 78.0 - 100.0 fL   MCH 28.8 26.0 - 34.0 pg   MCHC 33.6 30.0 - 36.0 g/dL   RDW 15.5 11.5 - 15.5 %   Platelets 257 150 - 400 K/uL  Ethanol (ETOH)  Result Value Ref Range   Alcohol, Ethyl (B) <5 0 - 9 mg/dL  Salicylate level  Result Value Ref Range   Salicylate Lvl 123456 2.8 - 20.0 mg/dL  Urine Drug Screen  Result Value Ref Range   Opiates NONE DETECTED NONE DETECTED   Cocaine POSITIVE (A) NONE DETECTED   Benzodiazepines NONE DETECTED NONE DETECTED   Amphetamines NONE DETECTED NONE DETECTED   Tetrahydrocannabinol POSITIVE (A) NONE DETECTED  Barbiturates NONE DETECTED NONE DETECTED  Comprehensive metabolic panel  Result Value Ref Range   Sodium 139 135 - 145 mmol/L   Potassium 3.1 (L) 3.5 - 5.1 mmol/L   Chloride 105 96 - 112 mmol/L   CO2 24 19 - 32 mmol/L   Glucose, Bld 144 (H) 70 - 99 mg/dL   BUN 18 6 - 23 mg/dL   Creatinine, Ser 2.02 (H) 0.50 - 1.35 mg/dL   Calcium 9.0 8.4 - 10.5 mg/dL   Total Protein 7.9 6.0 - 8.3 g/dL   Albumin 4.2 3.5 - 5.2 g/dL   AST 26 0 - 37 U/L   ALT 16 0 - 53 U/L   Alkaline Phosphatase 84 39 - 117 U/L   Total Bilirubin 0.6 0.3 - 1.2 mg/dL   GFR calc non Af Amer 37 (L) >90 mL/min   GFR calc Af Amer 43 (L) >90 mL/min   Anion gap 10 5 - 15  Urinalysis, Routine w reflex microscopic  Result Value Ref Range   Color, Urine YELLOW YELLOW   APPearance CLEAR CLEAR   Specific Gravity, Urine 1.021 1.005 - 1.030   pH 5.5 5.0 - 8.0   Glucose, UA NEGATIVE NEGATIVE mg/dL   Hgb urine dipstick SMALL (A) NEGATIVE   Bilirubin Urine NEGATIVE NEGATIVE   Ketones, ur NEGATIVE NEGATIVE mg/dL   Protein, ur 100 (A) NEGATIVE mg/dL   Urobilinogen, UA 0.2 0.0 - 1.0 mg/dL   Nitrite NEGATIVE NEGATIVE   Leukocytes, UA NEGATIVE  NEGATIVE  Urine microscopic-add on  Result Value Ref Range   Squamous Epithelial / LPF RARE RARE   WBC, UA 0-2 <3 WBC/hpf   RBC / HPF 0-2 <3 RBC/hpf   Casts HYALINE CASTS (A) NEGATIVE   Crystals CA OXALATE CRYSTALS (A) NEGATIVE     Imaging Review Dg Chest 2 View  10/22/2014   CLINICAL DATA:  Hypertension  EXAM: CHEST  2 VIEW  COMPARISON:  08/11/2013  FINDINGS: Mild cardiomegaly which is stable from prior. Aortic and hilar contours are unremarkable. There is no edema, consolidation, effusion, or pneumothorax.  IMPRESSION: Mild cardiomegaly.   Electronically Signed   By: Jorje Guild M.D.   On: 10/22/2014 08:54     EKG Interpretation   Date/Time:  Thursday October 22 2014 08:26:25 EST Ventricular Rate:  82 PR Interval:  140 QRS Duration: 102 QT Interval:  392 QTC Calculation: 458 R Axis:   86 Text Interpretation:  Sinus rhythm Left ventricular hypertrophy  Nonspecific T abnormalities, inferior leads ST elevation, consider  anterior injury Baseline wander in lead(s) V2 Confirmed by Rumi Taras  MD,  Lamar Meter ([id number]) on 10/22/2014 8:32:57 AM      MDM   Final diagnoses:  Hypertension  PTSD (post-traumatic stress disorder)  Suicidal ideation    Patient with history of posttraumatic stress disorder states she's been having nightmares cannot sleep and feeling very anxious. Patient's having serious thoughts of trying to harm himself. Patient also with blood pressures out of control as been off blood pressure medicines for months.  Workup here some improvement in his blood pressure did arrive so blood pressures 214/116 patient started on Lopressor and Maxide and blood pressure was some improvement now 163/87. Patient without any hypertensive emergencies. Patient's lab workup without significant abnormalities has some renal insufficiency but not as bad as in the past. Patient is awaiting evaluation by behavioral health. For the posttraumatic stress disorder and for the suicidal ideations.  She will be continued on the Maxide and Lopressor. Patient has got  orders to be moved to the TCU.  Patient is medically cleared for behavioral health evaluation.     Fredia Sorrow, MD 10/22/14 [phone]

## 2014-10-22 NOTE — ED Notes (Signed)
Bed: WA11 Expected date:  Expected time:  Means of arrival:  Comments: Triage 2 

## 2014-10-22 NOTE — ED Notes (Addendum)
Pt states he is having a relapse of PTSD states he has nightmares and cannot sleep and always feels anxious. Pt also has hypertension and is unable to obtain medication. Pt is alert and oriented. Pt states he is having ideations of harming self. Pt used cocaine tonight for the first time in 3 months.

## 2014-10-22 NOTE — ED Notes (Signed)
ONE BAG OF PT BELONGINGS IN LOCKER 26.  PT SCRUBBED, WANDED, AND PLACED IN ROOM.  STAFFING CALLED FOR SITTER.

## 2014-10-23 ENCOUNTER — Inpatient Hospital Stay (HOSPITAL_COMMUNITY)
Admission: AD | Admit: 2014-10-23 | Discharge: 2014-10-29 | DRG: 885 | Disposition: A | Discharge status: Home / Self Care | Payer: Federal, State, Local not specified - Other | Source: Intra-hospital | Attending: Psychiatry | Admitting: Psychiatry

## 2014-10-23 ENCOUNTER — Encounter (HOSPITAL_COMMUNITY): Payer: Self-pay

## 2014-10-23 DIAGNOSIS — I10 Essential (primary) hypertension: Secondary | ICD-10-CM | POA: Diagnosis present

## 2014-10-23 DIAGNOSIS — F1910 Other psychoactive substance abuse, uncomplicated: Secondary | ICD-10-CM | POA: Diagnosis present

## 2014-10-23 DIAGNOSIS — J45909 Unspecified asthma, uncomplicated: Secondary | ICD-10-CM | POA: Diagnosis present

## 2014-10-23 DIAGNOSIS — F4310 Post-traumatic stress disorder, unspecified: Secondary | ICD-10-CM | POA: Diagnosis present

## 2014-10-23 DIAGNOSIS — R45851 Suicidal ideations: Secondary | ICD-10-CM | POA: Diagnosis present

## 2014-10-23 DIAGNOSIS — G4700 Insomnia, unspecified: Secondary | ICD-10-CM | POA: Diagnosis present

## 2014-10-23 DIAGNOSIS — F419 Anxiety disorder, unspecified: Secondary | ICD-10-CM | POA: Diagnosis present

## 2014-10-23 DIAGNOSIS — F17200 Nicotine dependence, unspecified, uncomplicated: Secondary | ICD-10-CM | POA: Diagnosis present

## 2014-10-23 DIAGNOSIS — F332 Major depressive disorder, recurrent severe without psychotic features: Secondary | ICD-10-CM | POA: Diagnosis not present

## 2014-10-23 DIAGNOSIS — Z9114 Patient's other noncompliance with medication regimen: Secondary | ICD-10-CM | POA: Diagnosis present

## 2014-10-23 DIAGNOSIS — F322 Major depressive disorder, single episode, severe without psychotic features: Principal | ICD-10-CM | POA: Diagnosis present

## 2014-10-23 DIAGNOSIS — F1994 Other psychoactive substance use, unspecified with psychoactive substance-induced mood disorder: Secondary | ICD-10-CM | POA: Diagnosis not present

## 2014-10-23 MED ORDER — METOPROLOL TARTRATE 50 MG PO TABS
50.0000 mg | ORAL_TABLET | Freq: Two times a day (BID) | ORAL | Status: DC
  Administered 2014-10-23: 50 mg via ORAL
  Filled 2014-10-23: qty 1
  Filled 2014-10-23: qty 2
  Filled 2014-10-23 (×3): qty 1

## 2014-10-23 MED ORDER — MAGNESIUM HYDROXIDE 400 MG/5ML PO SUSP
30.0000 mL | Freq: Every day | ORAL | Status: DC | PRN
  Administered 2014-10-25: 30 mL via ORAL
  Filled 2014-10-23: qty 30

## 2014-10-23 MED ORDER — ACETAMINOPHEN 325 MG PO TABS
650.0000 mg | ORAL_TABLET | Freq: Four times a day (QID) | ORAL | Status: DC | PRN
  Administered 2014-10-23: 650 mg via ORAL
  Filled 2014-10-23: qty 2

## 2014-10-23 MED ORDER — MIRTAZAPINE 15 MG PO TABS
15.0000 mg | ORAL_TABLET | Freq: Every day | ORAL | Status: DC
  Administered 2014-10-23 – 2014-10-25 (×3): 15 mg via ORAL
  Filled 2014-10-23 (×5): qty 1

## 2014-10-23 MED ORDER — AMLODIPINE BESYLATE 10 MG PO TABS
10.0000 mg | ORAL_TABLET | Freq: Every day | ORAL | Status: DC
  Administered 2014-10-23: 10 mg via ORAL
  Filled 2014-10-23: qty 1

## 2014-10-23 MED ORDER — HYDRALAZINE HCL 50 MG PO TABS
50.0000 mg | ORAL_TABLET | Freq: Three times a day (TID) | ORAL | Status: DC
  Administered 2014-10-23 (×2): 50 mg via ORAL
  Filled 2014-10-23 (×4): qty 1

## 2014-10-23 MED ORDER — MIRTAZAPINE 30 MG PO TABS: 15.0000 mg | ORAL_TABLET | Freq: Every day | ORAL | Status: DC

## 2014-10-23 MED ORDER — AMLODIPINE BESYLATE 10 MG PO TABS
10.0000 mg | ORAL_TABLET | Freq: Every day | ORAL | Status: DC
Start: 2014-10-24 — End: 2014-10-29
  Administered 2014-10-24 – 2014-10-29 (×6): 10 mg via ORAL
  Filled 2014-10-23 (×6): qty 1
  Filled 2014-10-23: qty 2
  Filled 2014-10-23: qty 1

## 2014-10-23 MED ORDER — ACETAMINOPHEN 325 MG PO TABS
650.0000 mg | ORAL_TABLET | Freq: Four times a day (QID) | ORAL | Status: DC | PRN
  Administered 2014-10-23 – 2014-10-28 (×5): 650 mg via ORAL
  Filled 2014-10-23 (×6): qty 2

## 2014-10-23 MED ORDER — TRIAMTERENE-HCTZ 37.5-25 MG PO TABS
1.0000 | ORAL_TABLET | Freq: Every day | ORAL | Status: DC
  Administered 2014-10-24 – 2014-10-29 (×6): 1 via ORAL
  Filled 2014-10-23 (×8): qty 1

## 2014-10-23 MED ORDER — ALUM & MAG HYDROXIDE-SIMETH 200-200-20 MG/5ML PO SUSP: 30.0000 mL | ORAL | Status: DC | PRN

## 2014-10-23 MED ORDER — HYDRALAZINE HCL 50 MG PO TABS
50.0000 mg | ORAL_TABLET | Freq: Three times a day (TID) | ORAL | Status: DC
  Administered 2014-10-23 – 2014-10-25 (×5): 50 mg via ORAL
  Filled 2014-10-23 (×7): qty 1
  Filled 2014-10-23: qty 2
  Filled 2014-10-23 (×4): qty 1
  Filled 2014-10-23: qty 2
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
## 2014-10-23 NOTE — Progress Notes (Signed)
D.  Pt pleasant on approach, new admission this evening.  Pt has had non-compliance with his medications due to sexual side effects.  Pt stated that he would be willing to take medication without sexual side effects and this is what he is hopeful for.  Pt has had increasing depression, suicidal ideation, and has been using cocaine and THC daily.  A.  Support and encouragement offered  R.  Pt remain safe on unit, will continue to monitor.

## 2014-10-23 NOTE — Consult Note (Signed)
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 Severe recurrent major depression without psychotic features [F33.2] 05/07/2014    Priority: High  . PTSD (post-traumatic stress disorder) [F43.10]   . Essential hypertension, benign [I10] 05/11/2014  . Elevated serum creatinine [R74.8] 05/08/2014  . Depressive disorder [F32.9] 05/07/2014  . Suicidal ideation [R45.851] 05/07/2014  . Homicidal ideation [R45.850] 05/07/2014  . Anxiety [F41.9] 05/07/2014    Total Time spent with patient: 45 minutes  Subjective:   Gabriel Ashley is a 49 y.o. male patient admitted with Major depression, substance   HPI: AA male, 49 years old was evaluated for increased depression and relapse on Alcohol, Cocaine and Marijuana.  Patient was last admitted at our inpatient Psychiatric unit last April for major depression and substance abuse.  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  Patient reported that he feels depressed and rated his depression 8/10 with 10 being severe depression.  He reports a hx of PTSD during his service and stated that he does have symptoms that ranges from anxiety, insomnia, flash backs and irritability.  He reports feeling suicidal and cannot contract for safety.  Patient reported that he has not been compliant with his medications lately.  He is angry that he relapsed on Alcohol, cocaine and Marijuana.  Patient reports drinking a couple of beers daily and his last drink was yesterday.  He smokes 2 blunts of Mariajuana daily and uses about a GM of Cocaine daily and all were used last yesterday.  Patient denies previous suicide  attempt but stated that he  is not safe to go home.  He denies weapon in the house but stated that his friends does have one to use if he can get hold of the weapon.   He denies A/VH.  Patient have been accepted for admission and we will be looking for any available inpatient Psychiatric bed.  Updates today, Patient reported that he stopped taking his medications because he was having sexual side effect.  Patient stated that he is willing to resume medications with less sexual side effects.  He denies SI/HI/AVH but stated that he is not sure of what he will do when he walks out of the ER.  We will continue to monitor patient while we wait for bed.  HPI Elements:   Location:  Major depression, Substance abuse. Quality:  Insomnia, feeling suiciidal, angry,. Severity:  severe. Timing:  Acute, relapsed on substances. Duration:  Chronic mental  illness. Context:  Brought self in for feeling suicidal.  Past Medical History:  Past Medical History  Diagnosis Date  . Hypertension   . Anxiety   . Asthma    History reviewed. No pertinent past surgical history. Family History: History reviewed. No pertinent family history. Social History:  History  Alcohol Use  . Yes    Comment: 2 40 oz beers last night     History  Drug Use  . Yes  . Special: Cocaine, Marijuana    History   Social History  . Marital Status: Single    Spouse Name: N/A    Number of Children: N/A  . Years of Education: N/A   Social History Main Topics  . Smoking  status: Current Every Day Smoker -- 1.00 packs/day for 31 years  . Smokeless tobacco: None  . Alcohol Use: Yes     Comment: 2 40 oz beers last night  . Drug Use: Yes    Special: Cocaine, Marijuana  . Sexual Activity: None   Other Topics Concern  . None   Social History Narrative   Additional Social History:    History of alcohol / drug use?: Yes Negative Consequences of Use: Museum/gallery curator, Personal relationships, Work / Youth worker Withdrawal Symptoms: Irritability, Patient aware of  relationship between substance abuse and physical/medical complications, Tingling, Agitation, Sweats, Change in blood pressure Name of Substance 1: Alcohol 1 - Age of First Use: 8 1 - Amount (size/oz): 2 (40oz) 1 - Frequency: Daily 1 - Duration: 7 years 1 - Last Use / Amount: 2 (40oz) Beer     Allergies:  No Known Allergies  Vitals: Blood pressure 161/91, pulse 69, temperature 98.5 F (36.9 C), temperature source Oral, resp. rate 20, SpO2 97 %.  Risk to Self: Suicidal Ideation: Yes-Currently Present Suicidal Intent: Yes-Currently Present Is patient at risk for suicide?: Yes Suicidal Plan?: No Access to Means: Yes Specify Access to Suicidal Means: He has access to guns What has been your use of drugs/alcohol within the last 12 months?: Beer How many times?: 2 Other Self Harm Risks: None Reported Triggers for Past Attempts: Other (Comment) (PTSD) Intentional Self Injurious Behavior: None Risk to Others: Homicidal Ideation: No Thoughts of Harm to Others: No Current Homicidal Intent: No Current Homicidal Plan: No Access to Homicidal Means: No Identified Victim: None Reported History of harm to others?: No Assessment of Violence: None Noted Violent Behavior Description: None Reported Does patient have access to weapons?: Yes (Comment) Criminal Charges Pending?: No Does patient have a court date: No Prior Inpatient Therapy: Prior Inpatient Therapy: Yes Prior Therapy Dates: August 2015 and 2010 Prior Therapy Facilty/Provider(s): [facility] and [facility]  Reason for Treatment: SI Prior Outpatient Therapy: Prior Outpatient Therapy: Yes Prior Therapy Dates: Ongoing  Prior Therapy Facilty/Provider(s): [facility]  Reason for Treatment: Medication Management   Current Facility-Administered Medications  Medication Dose Route Frequency Provider Last Rate Last Dose  . amLODipine (NORVASC) tablet 10 mg  10 mg Oral Daily Kalman Drape, MD   10 mg at 10/23/14 0930  . hydrALAZINE  (APRESOLINE) tablet 50 mg  50 mg Oral 3 times per day Kalman Drape, MD   50 mg at 10/23/14 1348  . metoprolol tartrate (LOPRESSOR) tablet 50 mg  50 mg Oral BID Fredia Sorrow, MD   50 mg at 10/23/14 [id number]  . mirtazapine (REMERON) tablet 15 mg  15 mg Oral QHS Manning Luna      . triamterene-hydrochlorothiazide (MAXZIDE-25) 37.5-25 MG per tablet 1 tablet  1 tablet Oral Daily Fredia Sorrow, MD   1 tablet at 10/23/14 [id number]   Current Outpatient Prescriptions  Medication Sig Dispense Refill  . albuterol (PROVENTIL HFA;VENTOLIN HFA) 108 (90 BASE) MCG/ACT inhaler Inhale 1-2 puffs into the lungs every 4 (four) hours as needed for wheezing or shortness of breath.    . hydrOXYzine (ATARAX/VISTARIL) 50 MG tablet Take 1 tablet (50 mg total) by mouth 3 (three) times daily as needed for anxiety. 45 tablet 0  . mirtazapine (REMERON) 15 MG tablet Take 1 tablet (15 mg total) by mouth at bedtime. For sleep 30 tablet 0  . traZODone (DESYREL) 150 MG tablet Take 150 mg by mouth at bedtime as needed for sleep.    Marland Kitchen amLODipine (  NORVASC) 10 MG tablet Take 10 mg by mouth daily.    Marland Kitchen atenolol (TENORMIN) 25 MG tablet Take 25 mg by mouth 3 (three) times daily.    . hydrALAZINE (APRESOLINE) 50 MG tablet Take 1 tablet (50 mg total) by mouth every 8 (eight) hours. For high blood pressure (Patient not taking: Reported on 10/22/2014) 90 tablet 0  . metoprolol tartrate (LOPRESSOR) 12.5 mg TABS tablet Take 0.5 tablets (12.5 mg total) by mouth 2 (two) times daily. For high blood pressure (Patient not taking: Reported on 10/22/2014) 60 tablet 0  . NIFEdipine (PROCARDIA-XL/ADALAT CC) 30 MG 24 hr tablet Take 1 tablet (30 mg total) by mouth daily. For high blood pressure (Patient not taking: Reported on 10/22/2014) 30 tablet 0  . triamterene-hydrochlorothiazide (MAXZIDE-25) 37.5-25 MG per tablet Take 1 tablet by mouth daily. For high blood pressure (Patient not taking: Reported on 10/22/2014) 30 tablet 0    Musculoskeletal: Strength &  Muscle Tone: within normal limits Gait & Station: normal Patient leans: N/A  Psychiatric Specialty Exam:     Blood pressure 161/91, pulse 69, temperature 98.5 F (36.9 C), temperature source Oral, resp. rate 20, SpO2 97 %.There is no weight on file to calculate BMI.  General Appearance: Casual  Eye Contact::  Good  Speech:  Clear and Coherent and Normal Rate  Volume:  Normal  Mood:  Angry, Anxious, Depressed and Hopeless  Affect:  Congruent  Thought Process:  Coherent, Goal Directed and Intact  Orientation:  Full (Time, Place, and Person)  Thought Content:  WDL  Suicidal Thoughts:  Yes.  without intent/plan  Homicidal Thoughts:  No  Memory:  Immediate;   Good Recent;   Good Remote;   Good  Judgement:  Poor  Insight:  Shallow  Psychomotor Activity:  Normal  Concentration:  Good  Recall:  NA  Fund of Knowledge:Fair  Language: Good  Akathisia:  NA  Handed:  Right  AIMS (if indicated):     Assets:  Desire for Improvement  ADL's:  Intact  Cognition: WNL  Sleep:      Medical Decision Making: Review and summation of old records (2), Established Problem, Worsening (2), Review of Medication Regimen & Side Effects (2) and Review of New Medication or Change in Dosage (2)  Treatment Plan Summary: Daily contact with patient to assess and evaluate symptoms and progress in treatment, Medication management and Plan Admit and wait for bed availability  Plan:  Recommend psychiatric Inpatient admission when medically cleared. Admit patient  Disposition: Admit to inpatient Psychiatric unit  Delfin Gant   PMHNP-BC 10/23/2014 2:51 PM  Patient seen, evaluated and I agree with notes by Nurse Practitioner. Corena Pilgrim, MD

## 2014-10-23 NOTE — Tx Team (Signed)
Initial Interdisciplinary Treatment Plan   PATIENT STRESSORS: Medication change or noncompliance Substance abuse   PATIENT STRENGTHS: Ability for insight Average or above average intelligence Capable of independent living   PROBLEM LIST: Problem List/Patient Goals Date to be addressed Date deferred Reason deferred Estimated date of resolution  Depression 10/24/14     Substance abuse 10/24/14     Suicidal Ideation 10/24/14     "to have medication ordered with no sexual side affects" 10/24/14                                    DISCHARGE CRITERIA:  Motivation to continue treatment in a less acute level of care Withdrawal symptoms are absent or subacute and managed without 24-hour nursing intervention  PRELIMINARY DISCHARGE PLAN: Attend 12-step recovery group Return to previous living arrangement  PATIENT/FAMIILY INVOLVEMENT: This treatment plan has been presented to and reviewed with the patient, Gabriel Ashley.  The patient and family have been given the opportunity to ask questions and make suggestions.  Margaretann Loveless 10/23/2014, 11:43 PM

## 2014-10-23 NOTE — ED Notes (Addendum)
Pt rested throughout the night getting up to use bathroom. Pt did take medications earlier with no problems. Pt BP remains elevated after medication administration. Sitter at bedside. No complaints of pain.

## 2014-10-23 NOTE — ED Notes (Signed)
MD at bedside. 

## 2014-10-23 NOTE — BH Assessment (Signed)
[facility] Assessment Progress Note  Per Charmaine Downs, NP this pt requires psychiatric hospitalization. Per Bedelia Person, TS, Debarah Crape, RN, [facility] has assigned pt to Rm 303-1. Pt has signed Voluntary Admission and Consent for Treatment, as well as Consent to Release Information. However, he specfies that the does not want the [facility], his outpatient provider, notified of his admission.  Original paperwork has been faxed to [facility].  Pt's nurse, Anderson Malta, has been notified.  She agrees to send original paperwork along with pt via [facility], and to call report to [phone].  Jalene Mullet, MA Triage Specialist 10/23/2014 @ 16:28

## 2014-10-23 NOTE — ED Notes (Signed)
Patient going to behavioral health for further evaluation and treatment. Transported by [facility].

## 2014-10-23 NOTE — Progress Notes (Signed)
The patient attended this evening's A.A. Meeting and had little to share.

## 2014-10-23 NOTE — ED Notes (Signed)
Patient pleasant on approach. Reports increased depression with suicidal thoughts. No active plan. Wants inpatient treatment. Wanted to go to the [facility] but no beds available. Will continue to monitor.

## 2014-10-24 DIAGNOSIS — F332 Major depressive disorder, recurrent severe without psychotic features: Secondary | ICD-10-CM

## 2014-10-24 DIAGNOSIS — F322 Major depressive disorder, single episode, severe without psychotic features: Principal | ICD-10-CM

## 2014-10-24 MED ORDER — NICOTINE 21 MG/24HR TD PT24
21.0000 mg | MEDICATED_PATCH | Freq: Every day | TRANSDERMAL | Status: DC
  Administered 2014-10-24 – 2014-10-29 (×6): 21 mg via TRANSDERMAL
  Filled 2014-10-24 (×8): qty 1

## 2014-10-24 MED ORDER — LABETALOL HCL 100 MG PO TABS
100.0000 mg | ORAL_TABLET | Freq: Three times a day (TID) | ORAL | Status: DC
  Administered 2014-10-24 – 2014-10-25 (×4): 100 mg via ORAL
  Filled 2014-10-24 (×9): qty 1

## 2014-10-24 NOTE — Progress Notes (Signed)
Gabriel Ashley has been seen OOB UAL  On the 300 hall today.Marland KitchenHe tolerates this fairly well. He would not get OOB this morning at med pass and did not want to wake up when this nurse took his morning meds to him in his room, consequently he ended up taking  His morning meds around1100.    A HE has attended 1 group this afternoon and he did complete his morning assessment  This morning and on it he wrote he has had positive SI  Within the past 24 hrs ( but he easily contracted for safety with this nurse)  and he rated his depression, hopelessness and anxiety " 8-9 / 8 / 7 ", respectively . His MD ordered an internal med consult ( to assess his  Continued high BP ) and he is started on labetolol 100 mg bid.      R Safety in place.
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
## 2014-10-24 NOTE — Progress Notes (Signed)
Discussed blood pressure medical regimen. Pt with known hypertension and non compliance. Just started on Norvasc, Hydralazine, Metoprolol. Due to UDS with + cocaine, recommended d/c Metoprolol and starting Labetalol 100 mg PO TID. Monitor BP control on this regimen and reassess in AM.  Faye Ramsay, MD  [facility] Pager [phone] Cell [phone]  If 7PM-7AM, please contact night-coverage [email or web address] Password [email or web address]
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
## 2014-10-24 NOTE — Progress Notes (Signed)
Psychoeducational Group Note  Date: 10/24/2014 Time:  1015  Group Topic/Focus:  Identifying Needs:   The focus of this group is to help patients identify their personal needs that have been historically problematic and identify healthy behaviors to address their needs.  Participation Level: Did not attend   Paulino Rily

## 2014-10-24 NOTE — BHH Group Notes (Signed)
[facility] Group Notes:  (Clinical Social Work)  10/24/2014     1:45-2:30PM  Summary of Progress/Problems:   The main focus of today's process group was to learn how to use a decisional balance exercise to move forward in the Stages of Change.  Motivational Interviewing was utilized to help patients explore in depth the perceived benefits and costs of a self-sabotaging behavior, as well as the  benefits and costs of replacing that with a healthy coping mechanism.   The patient expressed that his unhealthy coping skills are overeating and having anger at others.  He was drowsy during group, contributed little.  Type of Therapy:  Group Therapy - Process   Participation Level:  Minimal  Participation Quality:  Drowsy  Affect:  Blunted  Cognitive:  Appropriate  Insight:  Improving  Engagement in Therapy:  Improving  Modes of Intervention:  Education, Motivational Interviewing  Selmer Dominion, LCSW 10/24/2014, 3:15 PM

## 2014-10-24 NOTE — Progress Notes (Signed)
Psychoeducational Group Note  Date:  10/24/2014 Time:  1000  Group Topic/Focus:  Identifying Goals s:   The focus of this group is to help patients identify their personal goals and to identify the strategies that are helpful in achieving their goals.  Participation Level:  Active  Participation Quality: good   Affect: flat  Cognitive: intact  Insight:  good  Engagement in Group: engaged  Additional Comments:

## 2014-10-24 NOTE — Progress Notes (Deleted)
[facility] MD Progress Note  10/24/2014 3:16 PM Gabriel Ashley  MRN:  [id number] Subjective:  "My feet are hurting/hot ."  O:   AA male, 49 years old was evaluated for increased depression and relapse on alcohol and cocaine.  He reported SI.  Patient was last admitted at our inpatient Psychiatric unit last April for major depression and substance abuse.  He denies A/VH.  He is complaining today of tinging, heat and pain to bilateral feet.  Patient reports that, "I am thinking of ways to kill my self right now". Patient reports that when he was discharged from the [place] he was diagnosed with PTSD and he is now receiving disability.  Patient reports a past history of psychiatric hospitalization at [facility] in August 2015. Patient reports that he was is non compliant with taking his medication from his outpatient psychiatrist at the [facility] facility.   Principal Problem: Severe major depression Diagnosis:   Patient Active Problem List   Diagnosis Date Noted  . Severe major depression [F32.2] 10/23/2014  . PTSD (post-traumatic stress disorder) [F43.10]   . Essential hypertension, benign [I10] 05/11/2014  . Elevated serum creatinine [R74.8] 05/08/2014  . Depressive disorder [F32.9] 05/07/2014  . Suicidal ideation [R45.851] 05/07/2014  . Homicidal ideation [R45.850] 05/07/2014  . Anxiety [F41.9] 05/07/2014  . Severe recurrent major depression without psychotic features [F33.2] 05/07/2014   Total Time spent with patient: 30 minutes  Past Medical History:  Past Medical History  Diagnosis Date  . Hypertension   . Anxiety   . Asthma    History reviewed. No pertinent past surgical history. Family History: History reviewed. No pertinent family history. Social History:  History  Alcohol Use  . Yes    Comment: 2 40 oz beers last night     History  Drug Use  . Yes  . Special: Cocaine, Marijuana    History   Social History  . Marital Status: Single    Spouse Name: N/A    Number of  Children: N/A  . Years of Education: N/A   Social History Main Topics  . Smoking status: Current Every Day Smoker -- 1.00 packs/day for 31 years  . Smokeless tobacco: Never Used  . Alcohol Use: Yes     Comment: 2 40 oz beers last night  . Drug Use: Yes    Special: Cocaine, Marijuana  . Sexual Activity: None   Other Topics Concern  . None   Social History Narrative   Additional History:  AA male, 49 years old was evaluated for increased depression and relapse on Alcohol, Cocaine and Marijuana. Patient was last admitted at our inpatient Psychiatric unit last April for major depression and substance abuse. 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  He denies A/VH. Patient have been accepted for admission and we will be looking for any available inpatient Psychiatric bed.  Sleep: Fair  Appetite:  Fair  Assessment:  Gabriel Ashley is a 49 year old patient who came in with depression.  Musculoskeletal: Strength & Muscle Tone: within normal limits Gait & Station: normal Patient leans: N/A  Psychiatric Specialty Exam: Physical Exam  Vitals reviewed. Psychiatric: His mood appears anxious.    Review of Systems  Constitutional: Negative.   HENT: Negative.   Eyes: Negative.   Gastrointestinal: Negative.   Genitourinary: Negative.   Skin: Negative.   Endo/Heme/Allergies: Negative.   Psychiatric/Behavioral: The patient is nervous/anxious.     Blood pressure 184/115,  pulse 68, temperature 97.5 F (36.4 C), temperature source Oral, resp. rate 16, height 6' (1.829 m), weight 90.719 kg (200 lb), SpO2 95 %.Body mass index is 27.12 kg/(m^2).  General Appearance: Fairly Groomed  Engineer, water::  Fair  Speech:  Normal Rate  Volume:  Normal  Mood:  Depressed  Affect:  Congruent  Thought Process:  Coherent  Orientation:  Full (Time, Place, and Person)  Thought Content:  Rumination  Suicidal Thoughts:  No  Homicidal Thoughts:  No  Memory:   Immediate;   Fair Recent;   Fair Remote;   Fair  Judgement:  Intact  Insight:  Fair  Psychomotor Activity:  Normal  Concentration:  Fair  Recall:  [employer] of Knowledge:Good  Language: Good  Akathisia:  Negative  Handed:  Right  AIMS (if indicated):     Assets:  Communication Skills Desire for Improvement Resilience Social Support  ADL's:  Intact  Cognition: WNL  Sleep:  Number of Hours: 6.75   Current Medications: Current Facility-Administered Medications  Medication Dose Route Frequency Provider Last Rate Last Dose  . acetaminophen (TYLENOL) tablet 650 mg  650 mg Oral Q6H PRN Delfin Gant, NP   650 mg at 10/23/14 2133  . alum & mag hydroxide-simeth (MAALOX/MYLANTA) 200-200-20 MG/5ML suspension 30 mL  30 mL Oral Q4H PRN Delfin Gant, NP      . amLODipine (NORVASC) tablet 10 mg  10 mg Oral Daily Delfin Gant, NP   10 mg at 10/24/14 1204  . hydrALAZINE (APRESOLINE) tablet 50 mg  50 mg Oral 3 times per day Delfin Gant, NP   50 mg at 10/24/14 0610  . labetalol (NORMODYNE) tablet 100 mg  100 mg Oral TID Theodis Blaze, MD   100 mg at 10/24/14 1204  . magnesium hydroxide (MILK OF MAGNESIA) suspension 30 mL  30 mL Oral Daily PRN Delfin Gant, NP      . mirtazapine (REMERON) tablet 15 mg  15 mg Oral QHS Delfin Gant, NP   15 mg at 10/23/14 2132  . nicotine (NICODERM CQ - dosed in mg/24 hours) patch 21 mg  21 mg Transdermal Daily Nicholaus Bloom, MD   21 mg at 10/24/14 1205  . triamterene-hydrochlorothiazide (MAXZIDE-25) 37.5-25 MG per tablet 1 tablet  1 tablet Oral Daily Delfin Gant, NP   1 tablet at 10/24/14 1205    Lab Results: No results found for this or any previous visit (from the past 48 hour(s)).  Physical Findings: AIMS: Facial and Oral Movements Muscles of Facial Expression: None, normal Lips and Perioral Area: None, normal Jaw: None, normal Tongue: None, normal,Extremity Movements Upper (arms, wrists, hands, fingers): None,  normal Lower (legs, knees, ankles, toes): None, normal, Trunk Movements Neck, shoulders, hips: None, normal, Overall Severity Severity of abnormal movements (highest score from questions above): None, normal Incapacitation due to abnormal movements: None, normal Patient's awareness of abnormal movements (rate only patient's report): No Awareness, Dental Status Current problems with teeth and/or dentures?: No Does patient usually wear dentures?: No  CIWA:  CIWA-Ar Total: 0 COWS:     Treatment Plan Summary: Review of chart, vital signs, medications, and notes.  1-Individual and group therapy   2-Medication management for depression and anxiety: Medications reviewed with the patient and she stated no untoward effects, unchanged. Start Gabapentin 100 mg TID BP meds resumed.  Spoke with Dr Doyle Askew [facility] and she has made recommendations and adjusted medications.  3-Coping skills for depression, anxiety  4-Continue crisis stabilization and management  5-Address health issues--monitoring vital signs, stable  6-Treatment plan in progress to prevent relapse of depression and anxiety  Daily contact with patient to assess and evaluate symptoms and progress in treatment and Medication management  Medical Decision Making:  Self-Limited or Minor (1), New problem, with additional work up planned, Review of Psycho-Social Stressors (1), Discuss test with performing physician (1), Decision to obtain old records (1), Review and summation of old records (2) and Review of Medication Regimen & Side Effects (2)  Gabriel Ashley, [place], AGNP-BC 10/24/2014, 3:16 PM

## 2014-10-25 DIAGNOSIS — F332 Major depressive disorder, recurrent severe without psychotic features: Secondary | ICD-10-CM | POA: Diagnosis present

## 2014-10-25 DIAGNOSIS — I10 Essential (primary) hypertension: Secondary | ICD-10-CM

## 2014-10-25 DIAGNOSIS — F1994 Other psychoactive substance use, unspecified with psychoactive substance-induced mood disorder: Secondary | ICD-10-CM | POA: Diagnosis present

## 2014-10-25 DIAGNOSIS — R45851 Suicidal ideations: Secondary | ICD-10-CM

## 2014-10-25 MED ORDER — HYDRALAZINE HCL 25 MG PO TABS
75.0000 mg | ORAL_TABLET | Freq: Three times a day (TID) | ORAL | Status: DC
  Administered 2014-10-25 – 2014-10-29 (×11): 75 mg via ORAL
  Filled 2014-10-25 (×15): qty 3

## 2014-10-25 MED ORDER — LABETALOL HCL 200 MG PO TABS
200.0000 mg | ORAL_TABLET | Freq: Three times a day (TID) | ORAL | Status: DC
Start: 2014-10-25 — End: 2014-10-29
  Administered 2014-10-25 – 2014-10-29 (×12): 200 mg via ORAL
  Filled 2014-10-25 (×14): qty 1

## 2014-10-25 MED ORDER — POTASSIUM CHLORIDE CRYS ER 20 MEQ PO TBCR
20.0000 meq | EXTENDED_RELEASE_TABLET | Freq: Two times a day (BID) | ORAL | Status: AC
  Administered 2014-10-25 – 2014-10-27 (×4): 20 meq via ORAL
  Filled 2014-10-25 (×5): qty 1

## 2014-10-25 NOTE — Progress Notes (Signed)
[facility] MD Progress Note  10/25/2014 2:05 PM Gabriel Ashley  MRN:  [id number] Subjective:  Patient was found in room.  Sleeping.  When asked how he was feeling, he stated he was ok. O:  Patient is still having elevated BP's.  Taken BP manually and 160/100.  Dr De Nurse made aware.  Spoke with TRHospitalist Leisa Lenz MD and recommended orders placed.  Principal Problem: Severe major depression Diagnosis:   Patient Active Problem List   Diagnosis Date Noted  . Substance induced mood disorder [F19.94]   . Severe major depression [F32.2] 10/23/2014  . PTSD (post-traumatic stress disorder) [F43.10]   . Essential hypertension, benign [I10] 05/11/2014  . Elevated serum creatinine [R74.8] 05/08/2014  . Depressive disorder [F32.9] 05/07/2014  . Suicidal ideation [R45.851] 05/07/2014  . Homicidal ideation [R45.850] 05/07/2014  . Anxiety [F41.9] 05/07/2014  . Severe recurrent major depression without psychotic features [F33.2] 05/07/2014   Total Time spent with patient: 30 minutes   Past Medical History:  Past Medical History  Diagnosis Date  . Hypertension   . Anxiety   . Asthma    History reviewed. No pertinent past surgical history. Family History: History reviewed. No pertinent family history. Social History:  History  Alcohol Use  . Yes    Comment: 2 40 oz beers last night     History  Drug Use  . Yes  . Special: Cocaine, Marijuana    History   Social History  . Marital Status: Single    Spouse Name: N/A    Number of Children: N/A  . Years of Education: N/A   Social History Main Topics  . Smoking status: Current Every Day Smoker -- 1.00 packs/day for 31 years  . Smokeless tobacco: Never Used  . Alcohol Use: Yes     Comment: 2 40 oz beers last night  . Drug Use: Yes    Special: Cocaine, Marijuana  . Sexual Activity: None   Other Topics Concern  . None   Social History Narrative   Additional History:    Sleep: Fair  Appetite:  Fair  Assessment:    Musculoskeletal: Strength & Muscle Tone: within normal limits Gait & Station: normal Patient leans: N/A   Psychiatric Specialty Exam: Physical Exam  Vitals reviewed.   Review of Systems  Constitutional: Negative.   HENT: Negative.   Eyes: Negative.   Respiratory: Negative.   Cardiovascular: Negative.   Gastrointestinal: Negative.   Genitourinary: Negative.   Musculoskeletal: Negative.   Skin: Negative.   Neurological: Negative.   Endo/Heme/Allergies: Negative.   Psychiatric/Behavioral: The patient is nervous/anxious.     Blood pressure 160/100, pulse 91, temperature 98.6 F (37 C), temperature source Oral, resp. rate 18, height 6' (1.829 m), weight 90.719 kg (200 lb), SpO2 95 %.Body mass index is 27.12 kg/(m^2).   General Appearance: Casual  Eye Contact:: Fair  Speech: Slow  Volume: Decreased  Mood: Depressed and Dysphoric  Affect: Congruent  Thought Process: Coherent  Orientation: Full (Time, Place, and Person)  Thought Content: Rumination  Suicidal Thoughts: Yes. without intent/plan  Homicidal Thoughts: No  Memory: Immediate; Fair Recent; Fair  Judgement: poor  Insight: shallow  Psychomotor Activity: Decreased  Concentration: Fair  Recall: [employer] of Knowledge:Fair  Language: Fair  Akathisia: Negative  Handed: Right  AIMS (if indicated):    Assets: Desire for Improvement  Sleep: Number of Hours: 6.25  Cognition: WNL  ADL's: Intact   Current Medications: Current Facility-Administered Medications  Medication Dose Route Frequency Provider Last Rate Last  Dose  . acetaminophen (TYLENOL) tablet 650 mg  650 mg Oral Q6H PRN Delfin Gant, NP   650 mg at 10/23/14 2133  . alum & mag hydroxide-simeth (MAALOX/MYLANTA) 200-200-20 MG/5ML suspension 30 mL  30 mL Oral Q4H PRN Delfin Gant, NP      . amLODipine (NORVASC) tablet 10 mg  10 mg Oral Daily Delfin Gant, NP   10 mg at 10/25/14 1007  .  hydrALAZINE (APRESOLINE) tablet 75 mg  75 mg Oral 3 times per day Robbie Lis, MD   75 mg at 10/25/14 1356  . labetalol (NORMODYNE) tablet 200 mg  200 mg Oral TID Robbie Lis, MD      . magnesium hydroxide (MILK OF MAGNESIA) suspension 30 mL  30 mL Oral Daily PRN Delfin Gant, NP      . mirtazapine (REMERON) tablet 15 mg  15 mg Oral QHS Delfin Gant, NP   15 mg at 10/24/14 2229  . nicotine (NICODERM CQ - dosed in mg/24 hours) patch 21 mg  21 mg Transdermal Daily Nicholaus Bloom, MD   21 mg at 10/25/14 1008  . triamterene-hydrochlorothiazide (MAXZIDE-25) 37.5-25 MG per tablet 1 tablet  1 tablet Oral Daily Delfin Gant, NP   1 tablet at 10/25/14 1007    Lab Results: No results found for this or any previous visit (from the past 48 hour(s)).  Physical Findings: AIMS: Facial and Oral Movements Muscles of Facial Expression: None, normal Lips and Perioral Area: None, normal Jaw: None, normal Tongue: None, normal,Extremity Movements Upper (arms, wrists, hands, fingers): None, normal Lower (legs, knees, ankles, toes): None, normal, Trunk Movements Neck, shoulders, hips: None, normal, Overall Severity Severity of abnormal movements (highest score from questions above): None, normal Incapacitation due to abnormal movements: None, normal Patient's awareness of abnormal movements (rate only patient's report): No Awareness, Dental Status Current problems with teeth and/or dentures?: No Does patient usually wear dentures?: No  CIWA:  CIWA-Ar Total: 0 COWS:     Treatment Plan Summary: Daily contact with patient to assess and evaluate symptoms and progress in treatment and Medication management Will increase labetalol to 200 mg TID and hydralazine to 75 mg TID. If no improvement will likely add clonidine.  Will continue to monitor BP.  Medical Decision Making:  New problem, with additional work up planned, Discuss test with performing physician (1), Review and summation of old  records (2), Review of Medication Regimen & Side Effects (2) and Review of New Medication or Change in Dosage (2)  Gabriel, Ashley, AGNP-BC 10/25/2014, 2:05 PM I agreed with findings and treatment plan of this patient

## 2014-10-25 NOTE — [facility] Suicide Risk Assessment (Signed)
[facility] Admission Suicide Risk Assessment   Nursing information obtained from:    Demographic factors:    Current Mental Status:    Loss Factors:    Historical Factors:    Risk Reduction Factors:    Total Time spent with patient: 1.5 hours Principal Problem: Severe major depression Diagnosis:   Patient Active Problem List   Diagnosis Date Noted  . Severe major depression [F32.2] 10/23/2014  . PTSD (post-traumatic stress disorder) [F43.10]   . Essential hypertension, benign [I10] 05/11/2014  . Elevated serum creatinine [R74.8] 05/08/2014  . Depressive disorder [F32.9] 05/07/2014  . Suicidal ideation [R45.851] 05/07/2014  . Homicidal ideation [R45.850] 05/07/2014  . Anxiety [F41.9] 05/07/2014  . Severe recurrent major depression without psychotic features [F33.2] 05/07/2014     Continued Clinical Symptoms:  Alcohol Use Disorder Identification Test Final Score (AUDIT): 11 The "Alcohol Use Disorders Identification Test", Guidelines for Use in Primary Care, Second Edition.  World Pharmacologist [facility]). Score between 0-7:  no or low risk or alcohol related problems. Score between 8-15:  moderate risk of alcohol related problems. Score between 16-19:  high risk of alcohol related problems. Score 20 or above:  warrants further diagnostic evaluation for alcohol dependence and treatment.   CLINICAL FACTORS:   Depression:   Anhedonia Hopelessness Impulsivity Dysthymia Alcohol/Substance Abuse/Dependencies More than one psychiatric diagnosis Unstable or Poor Therapeutic Relationship Previous Psychiatric Diagnoses and Treatments   Musculoskeletal: Strength & Muscle Tone: within normal limits Gait & Station: normal Patient leans: N/A  Psychiatric Specialty Exam: Physical Exam  Constitutional: He appears well-developed.    Review of Systems  Constitutional: Negative for fever.  Cardiovascular: Negative for chest pain.  Skin: Negative for rash.  Neurological: Negative for  tremors and headaches.  Psychiatric/Behavioral: Positive for depression and substance abuse. The patient is nervous/anxious.     Blood pressure 160/100, pulse 91, temperature 98.6 F (37 C), temperature source Oral, resp. rate 18, height 6' (1.829 m), weight 90.719 kg (200 lb), SpO2 95 %.Body mass index is 27.12 kg/(m^2).  General Appearance: Casual  Eye Contact::  Fair  Speech:  Slow  Volume:  Decreased  Mood:  Depressed and Dysphoric  Affect:  Congruent  Thought Process:  Coherent  Orientation:  Full (Time, Place, and Person)  Thought Content:  Rumination  Suicidal Thoughts:  Yes.  without intent/plan  Homicidal Thoughts:  No  Memory:  Immediate;   Fair Recent;   Fair  Judgement:  poor  Insight:  shallow  Psychomotor Activity:  Decreased  Concentration:  Fair  Recall:  [employer] of Knowledge:Fair  Language: Fair  Akathisia:  Negative  Handed:  Right  AIMS (if indicated):     Assets:  Desire for Improvement  Sleep:  Number of Hours: 6.25  Cognition: WNL  ADL's:  Intact     COGNITIVE FEATURES THAT CONTRIBUTE TO RISK:  Closed-mindedness and Polarized thinking    SUICIDE RISK:   Moderate:  Frequent suicidal ideation with limited intensity, and duration, some specificity in terms of plans, no associated intent, good self-control, limited dysphoria/symptomatology, some risk factors present, and identifiable protective factors, including available and accessible social support.  PLAN OF CARE: inpatient stabilization. Medication management and support groups.   Medical Decision Making:  Established Problem, Worsening (2), Review of Last Therapy Session (1), Independent Review of image, tracing or specimen (2), Review of Medication Regimen & Side Effects (2) and Review of New Medication or Change in Dosage (2)  I certify that inpatient services furnished can reasonably  be expected to improve the patient's condition.   Lucendia Leard 10/25/2014, 1:15 PM

## 2014-10-25 NOTE — Progress Notes (Addendum)
BP still elevated, SBP in 160's. Will increase labetalol to 200 mg TID and hydralazine to 75 mg TID.  If no improvement will likely add clonidine.  Gabriel Ashley [facility] [id number]

## 2014-10-25 NOTE — Progress Notes (Signed)
D.  Pt pleasant on approach, denies complaints at this time.  Positive for evening AA group, interacting appropriately with peers on the unit.  Pt made a no roommate due to extremely loud snoring and poor hygiene.  Denies SI/HI/hallucinations at this time.  A.  Support and encouragement offered  R.  Pt remains safe on unit, will continue to monitor.

## 2014-10-25 NOTE — Progress Notes (Signed)
Pt did not attend group. 

## 2014-10-25 NOTE — Progress Notes (Signed)
D) Pt has spent a good part of today sleeping in his bed. Pt has been having trouble with his blood pressure but is indifferent to attempts that are being made to have it lowered. Affect is flat and mood depressed. Comes out for meals and is responding to his peers some. A) Given support and encouragement to come out of his room and at least join in the superbowl party that is going on. Provided with a brief 1:1 R) Remains depressed and withdrawn.

## 2014-10-25 NOTE — [facility] Group Notes (Signed)
[facility] Group Notes:  (Clinical Social Work)  10/25/2014  10:00-11:00AM  Summary of Progress/Problems:   The main focus of today's process group was to   1)  discuss the importance of adding supports  2)  define health supports versus unhealthy supports  3)  identify the patient's current unhealthy supports and plan how to handle them  4)  Identify the patient's current healthy supports and plan what to add.  An emphasis was placed on using counselor, doctor, therapy groups, 12-step groups, and problem-specific support groups to expand supports.    The patient expressed full comprehension of the concepts presented, and agreed that there is a need to add more supports.  The patient stated that he resents seeing the [facility] only every 4 months for 30 minutes, and not having a chance to really talk.  CSW and other group members encouraged him to set up a therapist at [facility]. And use that as his opportunity to talk out his issues.  He was encouraging and interacted with others.  He stated that he has done a lot better with his depression and addiction when he has a structured life, and is interested in returning to that.  Type of Therapy:  Process Group with Motivational Interviewing  Participation Level:  Active  Participation Quality:  Attentive, Sharing and Supportive  Affect:  Blunted and Depressed  Cognitive:  Alert and Appropriate  Insight:  Engaged  Engagement in Therapy:  Engaged  Modes of Intervention:   Education, Support and Processing, Activity  [employer], LCSW 10/25/2014, 12:15pm

## 2014-10-25 NOTE — Progress Notes (Signed)
Patient did attend the evening speaker AA meeting.  

## 2014-10-25 NOTE — Progress Notes (Signed)
D.  Pt with flat affect but brightens with conversation.  BP still running high, medication dosage increased today.  Pt denies SI/HI/hallucinations at this time.  Interacting appropriately with peers on the unit.  Got order for no roommate continued due to loud snoring and poor hygiene.  Complaint of constipation but wishes to wait until morning to take medication for this to avoid accidents at night.  A.  Support and encouragement offered  R.  Pt remains safe on unit, will continue to monitor.

## 2014-10-26 ENCOUNTER — Encounter (HOSPITAL_COMMUNITY): Payer: Self-pay | Admitting: Psychiatry

## 2014-10-26 DIAGNOSIS — F1910 Other psychoactive substance abuse, uncomplicated: Secondary | ICD-10-CM | POA: Diagnosis present

## 2014-10-26 MED ORDER — CLONIDINE HCL 0.1 MG PO TABS
0.1000 mg | ORAL_TABLET | Freq: Two times a day (BID) | ORAL | Status: DC
Start: 2014-10-26 — End: 2014-10-29
  Administered 2014-10-26 – 2014-10-29 (×6): 0.1 mg via ORAL
  Filled 2014-10-26 (×8): qty 1

## 2014-10-26 MED ORDER — QUETIAPINE FUMARATE 100 MG PO TABS
100.0000 mg | ORAL_TABLET | Freq: Every day | ORAL | Status: DC
  Administered 2014-10-26 – 2014-10-28 (×3): 100 mg via ORAL
  Filled 2014-10-26 (×4): qty 1

## 2014-10-26 MED ORDER — BUPROPION HCL ER (XL) 150 MG PO TB24
150.0000 mg | ORAL_TABLET | Freq: Every day | ORAL | Status: DC
  Administered 2014-10-26 – 2014-10-28 (×3): 150 mg via ORAL
  Filled 2014-10-26 (×6): qty 1

## 2014-10-26 NOTE — Progress Notes (Signed)
D: Patient has depressed affect and mood. He reported on the self inventory sheet that his sleep and ability to concentrate are both poor, fair appetite and low energy level. Patient rates depression/feelings of hopelessness "8" and anxiety "7". Writer observed that the patient has been sleeping in bed the majority of the day; no participation in groups today. In adherence with the current medication regimen.  A: Support and encouragement provided to patient. Administered scheduled medications per ordering MD. Monitor Q15 minute checks for safety.  R: Patient receptive. Denies SI/HI and AVH. Patient remains safe on the unit.

## 2014-10-26 NOTE — Progress Notes (Signed)
BP still high 150's /100's so will add clonidine 0.1 mg BID Leisa Lenz [phone]

## 2014-10-26 NOTE — Progress Notes (Signed)
[facility] MD Progress Note  10/26/2014 10:10 AM Gabriel Ashley  MRN:  [id number] Subjective:  Still endorses persistent depression. He admits to suicidal ideas. He would contract for safety while here.  He did not sleep well last night and states he has tried the Trazodone and the Remeron and none worked for him. He does remember having been on Seroquel and it helping. Also endorses that he used to take Wellbutrin and that if he was to use an antidepressant he would rather use that one. He wants to go to a [place] residential program where they can address both his addiction and his mental health issues Principal Problem: Severe major depression Diagnosis:   Patient Active Problem List   Diagnosis Date Noted  . Substance induced mood disorder [F19.94]   . Major depressive disorder, recurrent, severe without psychotic features [F33.2]   . Severe major depression [F32.2] 10/23/2014  . PTSD (post-traumatic stress disorder) [F43.10]   . Essential hypertension, benign [I10] 05/11/2014  . Elevated serum creatinine [R74.8] 05/08/2014  . Depressive disorder [F32.9] 05/07/2014  . Suicidal ideation [R45.851] 05/07/2014  . Homicidal ideation [R45.850] 05/07/2014  . Anxiety [F41.9] 05/07/2014  . Severe recurrent major depression without psychotic features [F33.2] 05/07/2014   Total Time spent with patient: 30 minutes   Past Medical History:  Past Medical History  Diagnosis Date  . Hypertension   . Anxiety   . Asthma    History reviewed. No pertinent past surgical history. Family History: History reviewed. No pertinent family history. Social History:  History  Alcohol Use  . Yes    Comment: 2 40 oz beers last night     History  Drug Use  . Yes  . Special: Cocaine, Marijuana    History   Social History  . Marital Status: Single    Spouse Name: N/A    Number of Children: N/A  . Years of Education: N/A   Social History Main Topics  . Smoking status: Current Every Day Smoker -- 1.00 packs/day for  31 years  . Smokeless tobacco: Never Used  . Alcohol Use: Yes     Comment: 2 40 oz beers last night  . Drug Use: Yes    Special: Cocaine, Marijuana  . Sexual Activity: None   Other Topics Concern  . None   Social History Narrative   Additional History:    Sleep: Poor  Appetite:  Poor   Assessment:   Musculoskeletal: Strength & Muscle Tone: within normal limits Gait & Station: normal Patient leans: N/A   Psychiatric Specialty Exam: Physical Exam  Review of Systems  Constitutional: Positive for malaise/fatigue.  Eyes: Positive for blurred vision.  Respiratory: Positive for cough.        Pack a day  Cardiovascular: Negative.   Gastrointestinal: Negative.   Genitourinary: Negative.   Musculoskeletal: Positive for back pain and joint pain.  Skin: Negative.   Neurological: Positive for dizziness, weakness and headaches.  Endo/Heme/Allergies: Negative.   Psychiatric/Behavioral: Positive for depression, suicidal ideas and substance abuse. The patient is nervous/anxious and has insomnia.     Blood pressure 159/114, pulse 93, temperature 97.5 F (36.4 C), temperature source Oral, resp. rate 24, height 6' (1.829 m), weight 90.719 kg (200 lb), SpO2 95 %.Body mass index is 27.12 kg/(m^2).  General Appearance: Fairly Groomed  Engineer, water::  Fair  Speech:  Clear and Coherent  Volume:  fluctuates  Mood:  Anxious, Depressed and Irritable  Affect:  Restricted  Thought Process:  Coherent and Goal Directed  Orientation:  Full (Time, Place, and Person)  Thought Content:  symptoms events worries concerns   Suicidal Thoughts:  Yes.  without intent/plan  Homicidal Thoughts:  No  Memory:  Immediate;   Fair Recent;   Fair Remote;   Fair  Judgement:  Fair  Insight:  Shallow  Psychomotor Activity:  Restlessness  Concentration:  Fair  Recall:  [employer] of Knowledge:Fair  Language: Fair  Akathisia:  No  Handed:  Right  AIMS (if indicated):     Assets:  Desire for Improvement   ADL's:  Intact  Cognition: WNL  Sleep:  Number of Hours: 6     Current Medications: Current Facility-Administered Medications  Medication Dose Route Frequency Provider Last Rate Last Dose  . acetaminophen (TYLENOL) tablet 650 mg  650 mg Oral Q6H PRN Delfin Gant, NP   650 mg at 10/23/14 2133  . alum & mag hydroxide-simeth (MAALOX/MYLANTA) 200-200-20 MG/5ML suspension 30 mL  30 mL Oral Q4H PRN Delfin Gant, NP      . amLODipine (NORVASC) tablet 10 mg  10 mg Oral Daily Delfin Gant, NP   10 mg at 10/26/14 0840  . hydrALAZINE (APRESOLINE) tablet 75 mg  75 mg Oral 3 times per day Robbie Lis, MD   75 mg at 10/26/14 0606  . labetalol (NORMODYNE) tablet 200 mg  200 mg Oral TID Robbie Lis, MD   200 mg at 10/26/14 0841  . magnesium hydroxide (MILK OF MAGNESIA) suspension 30 mL  30 mL Oral Daily PRN Delfin Gant, NP   30 mL at 10/25/14 2223  . mirtazapine (REMERON) tablet 15 mg  15 mg Oral QHS Delfin Gant, NP   15 mg at 10/25/14 2223  . nicotine (NICODERM CQ - dosed in mg/24 hours) patch 21 mg  21 mg Transdermal Daily Nicholaus Bloom, MD   21 mg at 10/26/14 0841  . potassium chloride SA (K-DUR,KLOR-CON) CR tablet 20 mEq  20 mEq Oral BID Freda Munro May [name], NP   20 mEq at 10/26/14 0840  . triamterene-hydrochlorothiazide (MAXZIDE-25) 37.5-25 MG per tablet 1 tablet  1 tablet Oral Daily Delfin Gant, NP   1 tablet at 10/26/14 0840    Lab Results: No results found for this or any previous visit (from the past 48 hour(s)).  Physical Findings: AIMS: Facial and Oral Movements Muscles of Facial Expression: None, normal Lips and Perioral Area: None, normal Jaw: None, normal Tongue: None, normal,Extremity Movements Upper (arms, wrists, hands, fingers): None, normal Lower (legs, knees, ankles, toes): None, normal, Trunk Movements Neck, shoulders, hips: None, normal, Overall Severity Severity of abnormal movements (highest score from questions above): None,  normal Incapacitation due to abnormal movements: None, normal Patient's awareness of abnormal movements (rate only patient's report): No Awareness, Dental Status Current problems with teeth and/or dentures?: No Does patient usually wear dentures?: No  CIWA:  CIWA-Ar Total: 0 COWS:     Treatment Plan Summary: Daily contact with patient to assess and evaluate symptoms and progress in treatment and Medication management Substance abuse; assess and address detox needs. Assess and address cravings. Develop a relapse prevention plan Major Depression: resume the Wellbutrin XL 150 mg with plans to increase the dose to 300 mg Insomnia: trial with Seroquel 100 mg HS Mood instability: will use the Seroquel that will be started tonight as a mood stabiler also High BP; optimize management of his BF Explore residential treatment options Medical Decision Making:  Review of Psycho-Social Stressors (1),  Review or order clinical lab tests (1), Review of Medication Regimen & Side Effects (2) and Review of New Medication or Change in Dosage (2)     Ixchel Duck A 10/26/2014, 10:10 AM

## 2014-10-26 NOTE — BHH Group Notes (Signed)
[facility] LCSW Aftercare Discharge Planning Group Note   10/26/2014 10:33 AM  Participation Quality:  Minimal   Mood/Affect:  Depressed and Irritable  Depression Rating:  8  Anxiety Rating:  7  Thoughts of Suicide:  No Will you contract for safety?   NA  Current AVH:  No  Plan for Discharge/Comments:  Pt reports that he is not doing well today and presents with depressed mood and irritable affect. Pt reports that he wants direct transfer to [facility] hospital for substance abuse program if possible. He currently goes to [facility] for o/p med management. CSW assessing.   Transportation Means: unknown at this time. Pt has no access to transportation.   Supports: none identified.   Smart, [employer]

## 2014-10-26 NOTE — Tx Team (Signed)
Interdisciplinary Treatment Plan Update (Adult)   Date: 10/26/2014   Time Reviewed: 11:19 AM  Progress in Treatment:  Attending groups: Yes  Participating in groups:  Yes  Taking medication as prescribed: Yes  Tolerating medication: Yes  Family/Significant othe contact made: Not yet. SPE required for this pt.   Patient understands diagnosis: Yes, AEB seeking treatment for SI with plan to shoot self, ETOH/marijuana/cocaine abuse, and med noncompliance.  Discussing patient identified problems/goals with staff: Yes  Medical problems stabilized or resolved: Yes  Denies suicidal/homicidal ideation: Yes  Patient has not harmed self or Others: Yes  New problem(s) identified:  Discharge Plan or Barriers: Pt currently goes to [place] [facility] for mh services. Requesting inpatient treatment/transfer to [facility] hospital for SA treatment. CSW assessing.  Additional comments: Gabriel Ashley is an 50 y.o. male that reports SI. Patient reports that, "I am thinking of ways to kill my self right now". Patient reports that he is not able to contract for safety. Patient reports that he does have access to guns because he was in the [place] and all of his friends have guns. Patient reports that he lives with his son and there are no guns at his sons home. Patient reports that when he was discharged from the [place] he was diagnosed with PTSD and he is now receiving disability. Patient reports a history of substance abuse. BAL is <5 and his UDS is positive for cocaine. Patient reports minimal withdrawal symptoms. Patient reports that his longest period of sobriety was for 2 and a half years.Patient reports that he is here for his depression and suicidal ideation not his substance abuse. Patient denies physical, sexual or emotional abuse.Patient admitted for inpatient treatment. Spoke with Dr Olen Pel to discuss pateitn persistent BP. Recommendation to change Lopressor to Labetolol. Patient will be observed and monitored.  Patient reports a past history of psychiatric hospitalization at [facility] in August 2015. Patient reports that he was is non compliant with taking his medication from his outpatient psychiatrist at the [facility] facility.  Reason for Continuation of Hospitalization: Depression/mood instability Medication stabilization  Estimated length of stay: 3-5 days  For review of initial/current patient goals, please see plan of care.  Attendees:  Patient:    Family:    Physician: Carlton Adam, MD 10/26/2014 11:38 AM   Nursing: Garfield Cornea RN 10/26/2014 11:38 AM   Clinical Social Worker [place], [place]  10/26/2014 11:38 AM   Other: Edwyna Shell, LCSW 10/26/2014 11:38 AM   Other: Gerline Legacy Nurse CM 10/26/2014 11:38 AM   Other: Hilda Lias, Community Care Coordinator  10/26/2014 11:38 AM   Other: Daralene Milch TCT  10/26/2014 11:38 AM   Scribe for Treatment Team:  Maxie Better LCSWA 10/26/2014 11:39 AM

## 2014-10-27 DIAGNOSIS — F1910 Other psychoactive substance abuse, uncomplicated: Secondary | ICD-10-CM

## 2014-10-27 DIAGNOSIS — I10 Essential (primary) hypertension: Secondary | ICD-10-CM | POA: Insufficient documentation

## 2014-10-27 NOTE — BHH Group Notes (Signed)
[place] LCSW Group Therapy  10/27/2014 1:00 PM  Type of Therapy:  Group Therapy  Participation Level:  Did Not Attend-invited. Resting in room.   Summary of Progress/Problems: Today's Topic: Overcoming Obstacles. Pt identified obstacles faced currently and processed barriers involved in overcoming these obstacles. Pt identified steps necessary for overcoming these obstacles and explored motivation (internal and external) for facing these difficulties head on. Pt further identified one area of concern in their lives and chose a skill of focus pulled from their "toolbox."    Smart, [place] 10/27/2014, 1:00 PM

## 2014-10-27 NOTE — Progress Notes (Signed)
[facility] - DID NOT ATTEND  The focus of this group is to educate the patient on the purpose and policies of crisis stabilization and provide a format to answer questions about their admission.  The group details unit policies and expectations of patients while admitted.

## 2014-10-27 NOTE — Progress Notes (Signed)
D: Patient remains depressed with passive SI.  He denies HI/AVH.  He is guarded and eye contact is poor.  Patient is compliant with his medications.  Patient has had minimal interaction with staff and is not attending groups. A: Continue to monitor medication management and MD orders.  Safety checks completed every 15 minutes per protocol.  Meet 1:1 with patient to address needs and offer encouragement. R: Patient's behavior is appropriate to situation.

## 2014-10-27 NOTE — Progress Notes (Signed)
D   Pt endorses depression and anxiety   He has stayed in his room most of the shift and has very limited interaction with others  He contracts for safety and is otherwise compliant with treatment A   Verbal support given   Medications administered and effectiveness monitored   Q 15 min checks R   Pt safe at present

## 2014-10-27 NOTE — Progress Notes (Signed)
[facility] MD Progress Note  10/27/2014 4:39 PM Gabriel Ashley  MRN:  [id number] Subjective:  Still not feeling  much better. Feeling very depressed, despondent. Still with suicidal thoughts with a sense of hopelessness helplessness. Does not see any options but trying to get into the [place]. States he feels he is not going to make it out there if he does not get in. Principal Problem: Severe major depression Diagnosis:   Patient Active Problem List   Diagnosis Date Noted  . Substance abuse [F19.10] 10/26/2014    Priority: High  . Substance induced mood disorder [F19.94]     Priority: High  . PTSD (post-traumatic stress disorder) [F43.10]     Priority: High  . Essential hypertension, benign [I10] 05/11/2014    Priority: High  . Suicidal ideation [R45.851] 05/07/2014    Priority: High  . Severe recurrent major depression without psychotic features [F33.2] 05/07/2014    Priority: High  . Essential hypertension [I10]   . Major depressive disorder, recurrent, severe without psychotic features [F33.2]   . Severe major depression [F32.2] 10/23/2014  . Elevated serum creatinine [R74.8] 05/08/2014  . Depressive disorder [F32.9] 05/07/2014  . Homicidal ideation [R45.850] 05/07/2014  . Anxiety [F41.9] 05/07/2014   Total Time spent with patient: 30 minutes   Past Medical History:  Past Medical History  Diagnosis Date  . Hypertension   . Anxiety   . Asthma    History reviewed. No pertinent past surgical history. Family History: History reviewed. No pertinent family history. Social History:  History  Alcohol Use  . Yes    Comment: 2 40 oz beers last night     History  Drug Use  . Yes  . Special: Cocaine, Marijuana    History   Social History  . Marital Status: Single    Spouse Name: N/A    Number of Children: N/A  . Years of Education: N/A   Social History Main Topics  . Smoking status: Current Every Day Smoker -- 1.00 packs/day for 31 years  . Smokeless tobacco: Never Used  .  Alcohol Use: Yes     Comment: 2 40 oz beers last night  . Drug Use: Yes    Special: Cocaine, Marijuana  . Sexual Activity: None   Other Topics Concern  . None   Social History Narrative   Additional History:    Sleep: Fair  Appetite:  Poor   Assessment:   Musculoskeletal: Strength & Muscle Tone: within normal limits Gait & Station: normal Patient leans: N/A   Psychiatric Specialty Exam: Physical Exam  Review of Systems  Constitutional: Positive for malaise/fatigue.  HENT: Negative.   Eyes: Negative.   Respiratory: Negative.   Cardiovascular: Negative.   Gastrointestinal: Negative.   Genitourinary: Negative.   Musculoskeletal: Negative.   Skin: Negative.   Neurological: Positive for weakness.  Endo/Heme/Allergies: Negative.   Psychiatric/Behavioral: Positive for depression and suicidal ideas. The patient is nervous/anxious.     Blood pressure 139/86, pulse 74, temperature 98.4 F (36.9 C), temperature source Oral, resp. rate 15, height 6' (1.829 m), weight 90.719 kg (200 lb), SpO2 95 %.Body mass index is 27.12 kg/(m^2).  General Appearance: Disheveled  Eye Contact::  Minimal  Speech:  Clear and Coherent and not spontaneous  Volume:  Decreased  Mood:  Depressed  Affect:  Restricted  Thought Process:  Coherent and Goal Directed  Orientation:  Full (Time, Place, and Person)  Thought Content:  symptoms events worries concerns  Suicidal Thoughts:  Yes.  without intent/plan  Homicidal Thoughts:  No  Memory:  Immediate;   Fair Recent;   Fair Remote;   Fair  Judgement:  Fair  Insight:  Present and Shallow  Psychomotor Activity:  Restlessness  Concentration:  Fair  Recall:  [employer] of Knowledge:Fair  Language: Fair  Akathisia:  No  Handed:  Right  AIMS (if indicated):     Assets:  Desire for Improvement  ADL's:  Intact  Cognition: WNL  Sleep:  Number of Hours: 6     Current Medications: Current Facility-Administered Medications  Medication Dose  Route Frequency Provider Last Rate Last Dose  . acetaminophen (TYLENOL) tablet 650 mg  650 mg Oral Q6H PRN Delfin Gant, NP   650 mg at 10/27/14 1548  . alum & mag hydroxide-simeth (MAALOX/MYLANTA) 200-200-20 MG/5ML suspension 30 mL  30 mL Oral Q4H PRN Delfin Gant, NP      . amLODipine (NORVASC) tablet 10 mg  10 mg Oral Daily Delfin Gant, NP   10 mg at 10/27/14 [id number]  . buPROPion (WELLBUTRIN XL) 24 hr tablet 150 mg  150 mg Oral Daily Nicholaus Bloom, MD   150 mg at 10/27/14 [id number]  . cloNIDine (CATAPRES) tablet 0.1 mg  0.1 mg Oral BID Robbie Lis, MD   0.1 mg at 10/27/14 [id number]  . hydrALAZINE (APRESOLINE) tablet 75 mg  75 mg Oral 3 times per day Robbie Lis, MD   75 mg at 10/27/14 1546  . labetalol (NORMODYNE) tablet 200 mg  200 mg Oral TID Robbie Lis, MD   200 mg at 10/27/14 1126  . magnesium hydroxide (MILK OF MAGNESIA) suspension 30 mL  30 mL Oral Daily PRN Delfin Gant, NP   30 mL at 10/25/14 2223  . nicotine (NICODERM CQ - dosed in mg/24 hours) patch 21 mg  21 mg Transdermal Daily Nicholaus Bloom, MD   21 mg at 10/27/14 0800  . QUEtiapine (SEROQUEL) tablet 100 mg  100 mg Oral QHS Nicholaus Bloom, MD   100 mg at 10/26/14 2121  . triamterene-hydrochlorothiazide (MAXZIDE-25) 37.5-25 MG per tablet 1 tablet  1 tablet Oral Daily Delfin Gant, NP   1 tablet at 10/27/14 [id number]    Lab Results: No results found for this or any previous visit (from the past 48 hour(s)).  Physical Findings: AIMS: Facial and Oral Movements Muscles of Facial Expression: None, normal Lips and Perioral Area: None, normal Jaw: None, normal Tongue: None, normal,Extremity Movements Upper (arms, wrists, hands, fingers): None, normal Lower (legs, knees, ankles, toes): None, normal, Trunk Movements Neck, shoulders, hips: None, normal, Overall Severity Severity of abnormal movements (highest score from questions above): None, normal Incapacitation due to abnormal movements: None, normal Patient's  awareness of abnormal movements (rate only patient's report): No Awareness, Dental Status Current problems with teeth and/or dentures?: No Does patient usually wear dentures?: No  CIWA:  CIWA-Ar Total: 0 COWS:     Treatment Plan Summary: Daily contact with patient to assess and evaluate symptoms and progress in treatment and Medication management  Substance abuse; continue to work on a relapse prevention plan Depression: will increase the Wellbutrin XL to 300 mg in AM Suicidal Ideas: will monitor, he is contracting for safety at this particular time Will explore possible placements if the [place] was not to be anoption Medical Decision Making:  Review of Psycho-Social Stressors (1), Review of Medication Regimen & Side Effects (2) and Review of New Medication or Change in Dosage (2)  Marely Apgar A 10/27/2014, 4:39 PM

## 2014-10-27 NOTE — Consult Note (Signed)
  [facility] PROGRESS NOTE  Gabriel Ashley [id number] DOB: 1966/04/30 DOA: 10/23/2014 PCP: PROVIDER NOT IN SYSTEM  Brief narrative:    50 year old male who is in behavioral health for treatment of severe major depression and substance induced mood disorder. Off note, urine drug screen was positive for cocaine. [facility] consulted for management of blood pressure.   Assessment/Plan:    Principal Problem:   Severe major depression - Management per psychiatry  Active Problems:   Essential hypertension - Blood pressure still high but much better since patient was first admitted. His blood pressure this morning is 152/96. - Current regimen includes Norvasc 10 mg daily, hydralazine 75 mg every 8 hours, labetalol 200 mg 3 times daily and Maxzide daily. We added clonidine 0.1 mg twice daily yesterday 10/26/2014 with some blood pressure improvement. - We'll follow-up tomorrow and if still high we have some room to increase clonidine.  Code Status: Full.  Family Communication:  plan of care discussed with the patient   Leisa Lenz, MD  [facility] Pager [phone]  If 7PM-7AM, please contact night-coverage [email or web address] Password [email or web address] 10/27/2014, 8:09 AM   LOS: 4 days    HPI/Subjective: No acute overnight events.  Objective: Filed Vitals:   10/25/14 2141 10/26/14 1730 10/27/14 0630 10/27/14 0631  BP: 159/114 145/96 158/97 152/96  Pulse: 93 79 79 85  Temp:   98.4 F (36.9 C)   TempSrc:   Oral   Resp:   15   Height:      Weight:      SpO2:       No intake or output data in the 24 hours ending 10/27/14 0809  Exam:   General:  Pt is alert, follows commands appropriately, not in acute distress  Cardiovascular: Regular rate and rhythm, S1/S2, no murmurs  Respiratory: Clear to auscultation bilaterally, no wheezing, no crackles, no rhonchi  Abdomen: Soft, non tender, non distended, bowel sounds present  Extremities: No edema, pulses DP and PT  palpable bilaterally  Neuro: Grossly nonfocal  Data Reviewed: Basic Metabolic Panel:  Recent Labs Lab 10/22/14 0724  NA 139  K 3.1*  CL 105  CO2 24  GLUCOSE 144*  BUN 18  CREATININE 2.02*  CALCIUM 9.0   Liver Function Tests:  Recent Labs Lab 10/22/14 0724  AST 26  ALT 16  ALKPHOS 84  BILITOT 0.6  PROT 7.9  ALBUMIN 4.2   No results for input(s): LIPASE, AMYLASE in the last 168 hours. No results for input(s): AMMONIA in the last 168 hours. CBC:  Recent Labs Lab 10/22/14 0749  WBC 8.1  HGB 13.0  HCT 38.7*  MCV 85.6  PLT 257   Cardiac Enzymes: No results for input(s): CKTOTAL, CKMB, CKMBINDEX, TROPONINI in the last 168 hours. BNP: Invalid input(s): POCBNP CBG: No results for input(s): GLUCAP in the last 168 hours.  No results found for this or any previous visit (from the past 240 hour(s)).   Scheduled Meds: . amLODipine  10 mg Oral Daily  . buPROPion  150 mg Oral Daily  . cloNIDine  0.1 mg Oral BID  . hydrALAZINE  75 mg Oral 3 times per day  . labetalol  200 mg Oral TID  . nicotine  21 mg Transdermal Daily  . potassium chloride  20 mEq Oral BID  . QUEtiapine  100 mg Oral QHS  . triamterene-hydrochlorothiazide  1 tablet Oral Daily

## 2014-10-27 NOTE — Progress Notes (Signed)
D   Pt endorses depression and anxiety   He has stayed in his room most of the shift and has very limited interaction with others  He contracts for safety and is otherwise compliant with treatment  Pt continues to be irritable on approach and isolates   His interactions are minimal A   Verbal support given   Medications administered and effectiveness monitored   Q 15 min checks  Encourage compliance with groups R   Pt safe at present

## 2014-10-27 NOTE — BHH Group Notes (Signed)
[facility] LCSW Group Therapy  10/27/2014 1:27 PM  Type of Therapy:  Group Therapy  Participation Level:  Did Not Attend-in room resting. Did not attend  Summary of Progress/Problems: [facility] Speaker came to talk about his personal journey with substance abuse and addiction. The pt processed ways by which to relate to the speaker. [place] speaker provided handouts and educational information pertaining to groups and services offered by the [facility].   Smart, Gabriel Ashley LCSWA 10/27/2014, 1:27 PM

## 2014-10-27 NOTE — Clinical Social Work Note (Signed)
CSW met with pt individually to discuss aftercare and check in. Pt has not been attending groups today and stated that he feels "depressed" and endorses passive SI this afternoon. CSW explained that there is waiting list for [facility] substance [facility] tx facilities and pt agreeable to allowing CSW to submit referrals to [facility] and [facility]. [facility] rejected pt due to assault of male charge with March 11th court date. [facility] will not take pt until court date taken care of. Pt also encouraged to think about other options (outpatient SA IOP) and returning to his home. Pt ambivalent at this time and stated "I don't need substance abuse groups." CSW confronted pt regarding his reason for seeking inpatient treatment if he did not want s/a groups and pt did not answer. CSW asked pt to think more about o/p options, as this seems to be his only option due to pending legal issues and asked him to attend d/c planning group tomorrow morning to check in with CSW. Pt verbalized understanding and agreed to go to group tomorrow morning.  [place], Lydia 10/27/2014 3:33 PM

## 2014-10-27 NOTE — BHH Suicide Risk Assessment (Signed)
[place] INPATIENT:  Family/Significant Other Suicide Prevention Education  Suicide Prevention Education:  Patient Refusal for Family/Significant Other Suicide Prevention Education: The patient Gabriel Ashley has refused to provide written consent for family/significant other to be provided Family/Significant Other Suicide Prevention Education during admission and/or prior to discharge.  Physician notified.  SPE completed with pt. SPI pamphlet provided to pt and he was encouraged to share information with support network, ask questions, and talk about any concerns relating to SPE. Pt also provided with Mobile Crisis hotline info.   Smart, Sohum Delillo LCSWA  10/27/2014, 4:12 PM

## 2014-10-28 DIAGNOSIS — F1994 Other psychoactive substance use, unspecified with psychoactive substance-induced mood disorder: Secondary | ICD-10-CM

## 2014-10-28 MED ORDER — BUPROPION HCL ER (XL) 300 MG PO TB24
300.0000 mg | ORAL_TABLET | Freq: Every day | ORAL | Status: DC
  Administered 2014-10-29: 300 mg via ORAL
  Filled 2014-10-28 (×2): qty 1

## 2014-10-28 NOTE — BHH Group Notes (Signed)
[facility] LCSW Group Therapy 10/28/2014  1:15 PM Type of Therapy: Group Therapy Participation Level: Active  Participation Quality: Attentive, Sharing and Supportive  Affect: Appropriate  Cognitive: Alert and Oriented  Insight: Developing/Improving and Engaged  Engagement in Therapy: Developing/Improving and Engaged  Modes of Intervention: Clarification, Confrontation, Discussion, Education, Exploration, Limit-setting, Orientation, Problem-solving, Rapport Building, Art therapist, Socialization and Support  Summary of Progress/Problems: The topic for group today was emotional regulation. This group focused on both positive and negative emotion identification and allowed group members to process ways to identify feelings, regulate negative emotions, and find healthy ways to manage internal/external emotions. Group members were asked to reflect on a time when their reaction to an emotion led to a negative outcome and explored how alternative responses using emotion regulation would have benefited them. Group members were also asked to discuss a time when emotion regulation was utilized when a negative emotion was experienced. Patient discussed the transition and adjustments following a break up and discussed his lack of motivation to get engaged in the community. He also discussed his problematic communication styles. CSW and other group members provided emotional support and encouragement.  Tilden Fossa, MSW, [place] Worker [facility] [phone]

## 2014-10-28 NOTE — Progress Notes (Signed)
  [facility] Adult Case Management Discharge Plan :  Will you be returning to the same living situation after discharge:  Yes,  home for the night with son- At discharge, do you have transportation home?: Yes,  pt calling for ride. bus pass in chart. Train ticket from [place] to [place] also in chart so that pt can go to [facility] ED tomorrow if he does not get accepted to [facility].  Do you have the ability to pay for your medications: Yes,  VA benefits  Release of information consent forms completed and submitted to medical records by CSW.  Patient to Follow up at: Follow-up Information    Follow up with [facility].   Why:  Screening with Merry Proud on this date at 8:00AM. Please bring ID/verifying [employer] address with you to screening. Med Record Number : [id number]   Contact information:   [address] [place], [place] [address] Phone: [phone] Fax: [phone]      Follow up with Norm Salt (PCP).   Why:  Representative will call you at discharge with appt time and date for Primary Care/hospital follow-up appt.    Contact information:   [place], [place] Phone: [phone] Fax: [phone]      Follow up with [facility].   Why:  Representative will call you at discharge with appt time and date for psychiatry/therapy appt.    Contact information:   [place], [place] Phone: [phone] ext [phone] Fax: X      Patient denies SI/HI: Yes,  during self report.     Safety Planning and Suicide Prevention discussed: Yes,  Pt refused to allow CSW to speak with family/friends. SPE completed with pt. SPI pamphlet provided to pt and he was encouraged to share information with support network, ask questions, and talk about any concerns relating to SPE.     Has patient been referred to the Quitline?: Patient refused referral  Smart, Alicia Amel  10/28/2014, 10:42 AM

## 2014-10-28 NOTE — Progress Notes (Signed)
BP reasonably control on current BP meds. Will follow up in am. Leisa Lenz [facility] [id number]

## 2014-10-28 NOTE — Progress Notes (Signed)
Pt attended NA group

## 2014-10-28 NOTE — BHH Group Notes (Signed)
[facility] LCSW Aftercare Discharge Planning Group Note   10/28/2014 10:22 AM  Participation Quality:  Invited-DID NOT ATTEND. Stayed in bed.   Smart, [employer]

## 2014-10-28 NOTE — Clinical Social Work Note (Signed)
CSW met with pt with MD. Pt informed that he is d/cing today. Pt has not been attending groups or active in his aftercare or treatment plan. CSW let pt know that [facility] denied him admission due to pending assault on male' charge and upcoming court date in early March. Merry Proud at [facility] was agreeable to screening pt for possible admission tomorrow (Thursday) at [facility]. Train ticket ordered for pt for Thursday afternoon for him to get to [facility] ED, being that he is focused on getting into [place] treatment. Pt ambivalent when discussing d/c plans with MD and CSW. Bus pass and train ticket in chart. Pt can stay with son tonight.   [place], [place] 10/28/2014 10:25 AM

## 2014-10-28 NOTE — Progress Notes (Signed)
D: Patient denies HI and A/V hallucinations; patient reports passive SI but contracts for safety and also reports some right shoulder pain; rates depression 8/10; hopelessness 8/10; anxiety 6/10  A: Monitored q 15 minutes; patient encouraged to attend groups; patient educated about medications; patient given medications per physician orders; patient encouraged to express feelings and/or concerns  R: Patient is concerned about discharge; patient is cooperative; patient is blunted and flat; patient's interaction with staff and peers is appropriate; patient was able to set goal to talk with staff 1:1 when having feelings of SI; patient is taking medications as prescribed and tolerating medications

## 2014-10-28 NOTE — Progress Notes (Signed)
[facility] MD Progress Note  10/28/2014 3:03 PM Gabriel Ashley  MRN:  [id number] Subjective:  Gabriel Ashley endorses that he does not want to die but when he starts thinking about his current situation he starts thinking about killing himself. He understands that it has taken five different antihypertensive agents  to control his BP. He also understands that his kidneys are not functioning  well. He also understands of the risk of using cocaine when you are taking beta blockers. Even with all that, states he does not feel he has control over his cocaine use. States the depression gets to a point in which he does not care if he lives or dies. States that he also uses cocaine to get himself out of depression but then on the other hand the depression gets worst when he is coming off the cocaine Principal Problem: Severe major depression Diagnosis:   Patient Active Problem List   Diagnosis Date Noted  . Substance abuse [F19.10] 10/26/2014    Priority: High  . Substance induced mood disorder [F19.94]     Priority: High  . PTSD (post-traumatic stress disorder) [F43.10]     Priority: High  . Essential hypertension, benign [I10] 05/11/2014    Priority: High  . Suicidal ideation [R45.851] 05/07/2014    Priority: High  . Severe recurrent major depression without psychotic features [F33.2] 05/07/2014    Priority: High  . Essential hypertension [I10]   . Major depressive disorder, recurrent, severe without psychotic features [F33.2]   . Severe major depression [F32.2] 10/23/2014  . Elevated serum creatinine [R74.8] 05/08/2014  . Depressive disorder [F32.9] 05/07/2014  . Homicidal ideation [R45.850] 05/07/2014  . Anxiety [F41.9] 05/07/2014   Total Time spent with patient: 30 minutes   Past Medical History:  Past Medical History  Diagnosis Date  . Hypertension   . Anxiety   . Asthma    History reviewed. No pertinent past surgical history. Family History: History reviewed. No pertinent family history. Social  History:  History  Alcohol Use  . Yes    Comment: 2 40 oz beers last night     History  Drug Use  . Yes  . Special: Cocaine, Marijuana    History   Social History  . Marital Status: Single    Spouse Name: N/A  . Number of Children: N/A  . Years of Education: N/A   Social History Main Topics  . Smoking status: Current Every Day Smoker -- 1.00 packs/day for 31 years  . Smokeless tobacco: Never Used  . Alcohol Use: Yes     Comment: 2 40 oz beers last night  . Drug Use: Yes    Special: Cocaine, Marijuana  . Sexual Activity: Not on file   Other Topics Concern  . None   Social History Narrative   Additional History:    Sleep: Poor  Appetite:  Poor   Assessment:   Musculoskeletal: Strength & Muscle Tone: within normal limits Gait & Station: normal Patient leans: N/A   Psychiatric Specialty Exam: Physical Exam  Review of Systems  Constitutional: Negative.   HENT: Negative.   Eyes: Negative.   Respiratory: Negative.   Cardiovascular: Negative.   Gastrointestinal: Negative.   Genitourinary: Negative.   Musculoskeletal: Negative.   Skin: Negative.   Neurological: Negative.   Endo/Heme/Allergies: Negative.   Psychiatric/Behavioral: Positive for depression, suicidal ideas and substance abuse. The patient is nervous/anxious.     Blood pressure 141/87, pulse 76, temperature 97.6 F (36.4 C), temperature source Oral, resp. rate 18, height  6' (1.829 m), weight 90.719 kg (200 lb), SpO2 95 %.Body mass index is 27.12 kg/(m^2).  General Appearance: Disheveled  Eye Sport and exercise psychologist::  Fair  Speech:  Clear and Coherent  Volume:  fluctuates  Mood:  Anxious, Depressed and Dysphoric  Affect:  Restricted  Thought Process:  Coherent and Goal Directed  Orientation:  Full (Time, Place, and Person)  Thought Content:  symptoms events worries concerns  Suicidal Thoughts:  Intermittent  Homicidal Thoughts:  No  Memory:  Immediate;   Fair Recent;   Fair Remote;   Fair  Judgement:   Fair  Insight:  Present  Psychomotor Activity:  Restlessness  Concentration:  Fair  Recall:  [employer] of Knowledge:Fair  Language: Fair  Akathisia:  No  Handed:  Right  AIMS (if indicated):     Assets:  Desire for Improvement  ADL's:  Intact  Cognition: WNL  Sleep:  Number of Hours: 6     Current Medications: Current Facility-Administered Medications  Medication Dose Route Frequency Provider Last Rate Last Dose  . acetaminophen (TYLENOL) tablet 650 mg  650 mg Oral Q6H PRN Delfin Gant, NP   650 mg at 10/28/14 0803  . alum & mag hydroxide-simeth (MAALOX/MYLANTA) 200-200-20 MG/5ML suspension 30 mL  30 mL Oral Q4H PRN Delfin Gant, NP      . amLODipine (NORVASC) tablet 10 mg  10 mg Oral Daily Delfin Gant, NP   10 mg at 10/28/14 0758  . buPROPion (WELLBUTRIN XL) 24 hr tablet 150 mg  150 mg Oral Daily Nicholaus Bloom, MD   150 mg at 10/28/14 0758  . cloNIDine (CATAPRES) tablet 0.1 mg  0.1 mg Oral BID Robbie Lis, MD   0.1 mg at 10/28/14 0758  . hydrALAZINE (APRESOLINE) tablet 75 mg  75 mg Oral 3 times per day Robbie Lis, MD   75 mg at 10/28/14 1437  . labetalol (NORMODYNE) tablet 200 mg  200 mg Oral TID Robbie Lis, MD   200 mg at 10/28/14 1206  . magnesium hydroxide (MILK OF MAGNESIA) suspension 30 mL  30 mL Oral Daily PRN Delfin Gant, NP   30 mL at 10/25/14 2223  . nicotine (NICODERM CQ - dosed in mg/24 hours) patch 21 mg  21 mg Transdermal Daily Nicholaus Bloom, MD   21 mg at 10/28/14 0804  . QUEtiapine (SEROQUEL) tablet 100 mg  100 mg Oral QHS Nicholaus Bloom, MD   100 mg at 10/27/14 2201  . triamterene-hydrochlorothiazide (MAXZIDE-25) 37.5-25 MG per tablet 1 tablet  1 tablet Oral Daily Delfin Gant, NP   1 tablet at 10/28/14 [id number]    Lab Results: No results found for this or any previous visit (from the past 48 hour(s)).  Physical Findings: AIMS: Facial and Oral Movements Muscles of Facial Expression: None, normal Lips and Perioral Area: None,  normal Jaw: None, normal Tongue: None, normal,Extremity Movements Upper (arms, wrists, hands, fingers): None, normal Lower (legs, knees, ankles, toes): None, normal, Trunk Movements Neck, shoulders, hips: None, normal, Overall Severity Severity of abnormal movements (highest score from questions above): None, normal Incapacitation due to abnormal movements: None, normal Patient's awareness of abnormal movements (rate only patient's report): No Awareness, Dental Status Current problems with teeth and/or dentures?: No Does patient usually wear dentures?: No  CIWA:  CIWA-Ar Total: 0 COWS:     Treatment Plan Summary: Daily contact with patient to assess and evaluate symptoms and progress in treatment and Medication management Substance Abuse:  continue to work on a relapse prevention plan Major Depression: will increase the Wellbutrin to XL 300 mg in AM ( Wellbutrin could help with cocaine cravings) He will be better served by being in the [facility]. Will work towards getting him to the [place] in [place]:  Review of Psycho-Social Stressors (1), Review or order clinical lab tests (1), Review of Medication Regimen & Side Effects (2) and Review of New Medication or Change in Dosage (2)     Auston Halfmann A 10/28/2014, 3:03 PM

## 2014-10-29 ENCOUNTER — Encounter (HOSPITAL_COMMUNITY): Payer: Self-pay | Admitting: Registered Nurse

## 2014-10-29 MED ORDER — QUETIAPINE FUMARATE 100 MG PO TABS: 100.0000 mg | ORAL_TABLET | Freq: Every day | ORAL | Status: DC

## 2014-10-29 MED ORDER — LABETALOL HCL 200 MG PO TABS: 200.0000 mg | ORAL_TABLET | Freq: Three times a day (TID) | ORAL | Status: DC

## 2014-10-29 MED ORDER — AMLODIPINE BESYLATE 10 MG PO TABS: 10.0000 mg | ORAL_TABLET | Freq: Every day | ORAL | Status: DC

## 2014-10-29 MED ORDER — HYDRALAZINE HCL 25 MG PO TABS: 75.0000 mg | ORAL_TABLET | Freq: Three times a day (TID) | ORAL | Status: DC

## 2014-10-29 MED ORDER — CLONIDINE HCL 0.2 MG PO TABS: 0.2000 mg | ORAL_TABLET | Freq: Two times a day (BID) | ORAL | Status: DC

## 2014-10-29 MED ORDER — TRIAMTERENE-HCTZ 37.5-25 MG PO TABS: 1.0000 | ORAL_TABLET | Freq: Every day | ORAL | Status: DC

## 2014-10-29 MED ORDER — BUPROPION HCL ER (XL) 300 MG PO TB24: 300.0000 mg | ORAL_TABLET | Freq: Every day | ORAL | Status: DC

## 2014-10-29 MED ORDER — CLONIDINE HCL 0.1 MG PO TABS: 0.1000 mg | ORAL_TABLET | Freq: Two times a day (BID) | ORAL | Status: DC

## 2014-10-29 NOTE — Progress Notes (Signed)
D. Pt had been up and active in milieu this evening, did attend and participate in evening group activity. Pt spoke about discharge tomorrow and spoke about how he will be going to the [facility] [place] in [place] for further treatment. Pt spoke about feeling better and feels ready for discharge, only complaint was he reports he is still having troubles with sleep. A. Support and encouragement provided, medication education provided. R. Pt verbalized understanding, safety maintained.

## 2014-10-29 NOTE — Progress Notes (Signed)
Discharge Note: Discharge instructions/prescriptions and medication samples given to patient, along with bus passes and train ticket to commute to [facility]. Patient verbalized understanding of discharge instructions and prescriptions. Returned belongings to patient. Denies SI/HI/AVH. Patient d/c without incident to the lobby and transported from facility with his son.

## 2014-10-29 NOTE — Progress Notes (Signed)
Adult Psychoeducational Group Note  Date:  10/29/2014 Time:  9:00 AM  Group Topic/Focus:  Morning Wellness Group  Participation Level:  Active  Participation Quality:  Appropriate and Attentive  Affect:  Appropriate  Cognitive:  Alert and Oriented  Insight: Appropriate  Engagement in Group:  Engaged  Modes of Intervention:  Discussion  Additional Comments: Patient's goal today is to begin exercising and work on a healthier diet.  Gabriel Ashley E 10/29/2014, 10:27 AM

## 2014-10-29 NOTE — Discharge Summary (Signed)
Physician Discharge Summary Note  Patient:  Gabriel Ashley is an 49 y.o., male MRN:  [id number] DOB:  20-Apr-1966 Patient phone:  [phone] (home)  Patient address:   [address] [place] [address],  Total Time spent with patient: Greater than 30 minutes  Date of Admission:  10/23/2014 Date of Discharge: 10/29/2014   Reason for Admission:  Gabriel Ashley is an 49 y.o. male that reports SI. Patient reports that, "I am thinking of ways to kill my self right now". Patient reports that he is not able to contract for safety. Patient reports that he does have access to guns because he was in the [place] and all of his friends have guns. Patient reports that he lives with his son and there are no guns at his sons home.  Patient reports that when he was discharged from the [facility] he was diagnosed with PTSD and he is now receiving disability. Patient reports a history of substance abuse. BAL is <5 and his UDS is positive for cocaine. Patient reports minimal withdrawal symptoms. Patient reports that his longest period of sobriety was for 2 and a half years. Patient reports that he is here for his depression and suicidal ideation not his substance abuse. Patient denies physical, sexual or emotional abuse.  Patient admitted for inpatient treatment. Spoke with Dr Olen Pel to discuss patient persistent BP. Recommendation to change Lopressor to Labetalol. Patient will be observed and monitored.  To note: Patient reports a past history of psychiatric hospitalization at [facility] in August 2015. Patient reports that he was is non compliant with taking his medication from his outpatient psychiatrist at the [facility] facility.    Principal Problem: Severe major depression Discharge Diagnoses: Patient Active Problem List   Diagnosis Date Noted  . Essential hypertension [I10]   . Substance abuse [F19.10] 10/26/2014  . Substance induced mood disorder [F19.94]   . Major depressive disorder,  recurrent, severe without psychotic features [F33.2]   . Severe major depression [F32.2] 10/23/2014  . PTSD (post-traumatic stress disorder) [F43.10]   . Essential hypertension, benign [I10] 05/11/2014  . Elevated serum creatinine [R74.8] 05/08/2014  . Depressive disorder [F32.9] 05/07/2014  . Suicidal ideation [R45.851] 05/07/2014  . Homicidal ideation [R45.850] 05/07/2014  . Anxiety [F41.9] 05/07/2014  . Severe recurrent major depression without psychotic features [F33.2] 05/07/2014    Musculoskeletal: Strength & Muscle Tone: within normal limits Gait & Station: normal Patient leans: N/A  Psychiatric Specialty Exam: See Suicide Risk Assessment Physical Exam  Review of Systems  Psychiatric/Behavioral: Positive for substance abuse. Negative for suicidal ideas, hallucinations and memory loss. Depression: Stable. Nervous/anxious: Stable. Insomnia: Stable.     Blood pressure 142/93, pulse 79, temperature 98.3 F (36.8 C), temperature source Oral, resp. rate 18, height 6' (1.829 m), weight 90.719 kg (200 lb), SpO2 95 %.Body mass index is 27.12 kg/(m^2).    Past Medical History:  Past Medical History  Diagnosis Date  . Hypertension   . Anxiety   . Asthma    History reviewed. No pertinent past surgical history. Family History: History reviewed. No pertinent family history. Social History:  History  Alcohol Use  . Yes    Comment: 2 40 oz beers last night     History  Drug Use  . Yes  . Special: Cocaine, Marijuana    History   Social History  . Marital Status: Single    Spouse Name: N/A  . Number of Children: N/A  . Years of Education: N/A   Social History Main  Topics  . Smoking status: Current Every Day Smoker -- 1.00 packs/day for 31 years  . Smokeless tobacco: Never Used  . Alcohol Use: Yes     Comment: 2 40 oz beers last night  . Drug Use: Yes    Special: Cocaine, Marijuana  . Sexual Activity: Not on file   Other Topics Concern  . None   Social History  Narrative     Risk to Self: Is patient at risk for suicide?: Yes Risk to Others:   Prior Inpatient Therapy:   Prior Outpatient Therapy:    Level of Care:  OP  Hospital Course:    Gabriel Ashley was admitted for Severe major depression and crisis management.  He was treated with Wellbutrin XL and Seroquel for sleep/mood control.  Medical problems were identified and treated as needed.  Home medications were restarted as appropriate.  Improvement was monitored by observation and Gabriel Ashley daily report of symptom reduction.  Emotional and mental status was monitored by daily self inventory reports completed by Gabriel Ashley and clinical staff.         Gabriel Ashley was evaluated by the treatment team for stability and plans for continued recovery upon discharge.  He was offered further treatment options upon discharge including Residential, Intensive Outpatient and Outpatient treatment. He will follow up with [facility] and for medication management and counseling.  Gabriel Ashley for primary care.  Patient refused [facility] for rehab substance abuse.    Gabriel Ashley motivation was an integral factor for scheduling further treatment.  Employment, transportation, bed availability, health status, family support, and any pending legal issues were also considered during his hospital stay.  Upon completion of this admission the patient was both mentally and medically stable for discharge denying suicidal/homicidal ideation, auditory/visual/tactile hallucinations, delusional thoughts and paranoia.       Consults:  psychiatry  Significant Diagnostic Studies:  labs: UDS, CBC, CMET, Urinalysis  Discharge Vitals:   Blood pressure 142/93, pulse 79, temperature 98.3 F (36.8 C), temperature source Oral, resp. rate 18, height 6' (1.829 m), weight 90.719 kg (200 lb), SpO2 95 %. Body mass index is 27.12 kg/(m^2). Lab Results:   No results found for this or any previous visit (from the past 72  hour(s)).  Physical Findings: AIMS: Facial and Oral Movements Muscles of Facial Expression: None, normal Lips and Perioral Area: None, normal Jaw: None, normal Tongue: None, normal,Extremity Movements Upper (arms, wrists, hands, fingers): None, normal Lower (legs, knees, ankles, toes): None, normal, Trunk Movements Neck, shoulders, hips: None, normal, Overall Severity Severity of abnormal movements (highest score from questions above): None, normal Incapacitation due to abnormal movements: None, normal Patient's awareness of abnormal movements (rate only patient's report): No Awareness, Dental Status Current problems with teeth and/or dentures?: No Does patient usually wear dentures?: No  CIWA:  CIWA-Ar Total: 0 COWS:      See Psychiatric Specialty Exam and Suicide Risk Assessment completed by Attending Physician prior to discharge.  Discharge destination:  Home  Is patient on multiple antipsychotic therapies at discharge:  No   Has Patient had three or more failed trials of antipsychotic monotherapy by history:  No    Recommended Plan for Multiple Antipsychotic Therapies: NA  Discharge Instructions    Activity as tolerated - No restrictions    Complete by:  As directed      Diet - low sodium heart healthy    Complete by:  As directed      Discharge instructions  Complete by:  As directed   Take all of you medications as prescribed by your mental healthcare provider.  Report any adverse effects and reactions from your medications to your outpatient provider promptly. Do not engage in alcohol and or illegal drug use while on prescription medicines. In the event of worsening symptoms call the crisis hotline, 911, and or go to the nearest emergency department for appropriate evaluation and treatment of symptoms. Follow-up with your primary care provider for your medical issues, concerns and or health care needs.   Keep all scheduled appointments.  If you are unable to keep an  appointment call to reschedule.  Let the nurse know if you will need medications before next scheduled appointment.            Medication List    STOP taking these medications        atenolol 25 MG tablet  Commonly known as:  TENORMIN     hydrOXYzine 50 MG tablet  Commonly known as:  ATARAX/VISTARIL     metoprolol tartrate 12.5 mg Tabs tablet  Commonly known as:  LOPRESSOR     mirtazapine 15 MG tablet  Commonly known as:  REMERON     NIFEdipine 30 MG 24 hr tablet  Commonly known as:  PROCARDIA-XL/ADALAT CC     traZODone 150 MG tablet  Commonly known as:  DESYREL      TAKE these medications      Indication   albuterol 108 (90 BASE) MCG/ACT inhaler  Commonly known as:  PROVENTIL HFA;VENTOLIN HFA  Inhale 1-2 puffs into the lungs every 4 (four) hours as needed for wheezing or shortness of breath.   Indication:  Asthma     amLODipine 10 MG tablet  Commonly known as:  NORVASC  Take 1 tablet (10 mg total) by mouth daily. For high blood pressure   Indication:  High Blood Pressure     buPROPion 300 MG 24 hr tablet  Commonly known as:  WELLBUTRIN XL  Take 1 tablet (300 mg total) by mouth daily. For depression   Indication:  Major Depressive Disorder     cloNIDine 0.1 MG tablet  Commonly known as:  CATAPRES  Take 1 tablet (0.1 mg total) by mouth 2 (two) times daily. For high blood pressure      hydrALAZINE 25 MG tablet  Commonly known as:  APRESOLINE  Take 3 tablets (75 mg total) by mouth every 8 (eight) hours. For high blood pressure   Indication:  High Blood Pressure     labetalol 200 MG tablet  Commonly known as:  NORMODYNE  Take 1 tablet (200 mg total) by mouth 3 (three) times daily. For high blood pressure   Indication:  High Blood Pressure     QUEtiapine 100 MG tablet  Commonly known as:  SEROQUEL  Take 1 tablet (100 mg total) by mouth at bedtime. For sleep and mood control   Indication:  Trouble Sleeping, mood control     triamterene-hydrochlorothiazide  37.5-25 MG per tablet  Commonly known as:  MAXZIDE-25  Take 1 tablet by mouth daily. For high blood pressure   Indication:  High Blood Pressure           Follow-up Information    Follow up with [facility].   Why:  Screening with Gabriel Ashley on this date at 8:00AM. Please bring ID/verifying [employer] address with you to screening. Med Record Number : [id number]   Contact information:   [place] [address] [place], [place]  [place] Phone: [phone] Fax: [phone]      Follow up with Gabriel Ashley (PCP).   Why:  Representative will call you at discharge with appt time and date for Primary Care/hospital follow-up appt.    Contact information:   [place], [place] [address] Phone: [phone] Fax: [phone]      Follow up with Gabriel Ashley.   Why:  Representative will call you at discharge with appt time and date for psychiatry/therapy appt.    Contact information:   [address]  [place], [place] [address] Phone: [phone] ext [phone] Fax: [phone]      Follow-up recommendations:  Activity:  As tolerated Diet:  As tolerated  Comments:   Patient has been instructed to take medications as prescribed; and report adverse effects to outpatient provider.  Follow up with primary doctor for any medical issues and If symptoms recur report to nearest emergency or crisis hot line.    Total Discharge Time: Greater than 30 minutes  Signed: Earleen Newport, FNP-BC 10/29/2014, 9:51 AM  I personally assessed the patient and formulated the plan Geralyn Flash A. Sabra Heck, M.D.

## 2014-10-29 NOTE — Progress Notes (Signed)
Increase clonidine to 0.2 mg twice daily. Patient will need strict follow-up in regards to blood pressure management once discharged. Will sign off.  Leisa Lenz [facility] [id number]

## 2014-10-29 NOTE — [facility]HH Suicide Risk Assessment (Signed)
[facility] Discharge Suicide Risk Assessment   Demographic Factors:  Male and Unemployed  Total Time spent with patient: 30 minutes  Musculoskeletal: Strength & Muscle Tone: within normal limits Gait & Station: normal Patient leans: N/A  Psychiatric Specialty Exam: Physical Exam  Review of Systems  Psychiatric/Behavioral: Positive for substance abuse.    Blood pressure 142/93, pulse 79, temperature 98.3 F (36.8 C), temperature source Oral, resp. rate 18, height 6' (1.829 m), weight 90.719 kg (200 lb), SpO2 95 %.Body mass index is 27.12 kg/(m^2).  General Appearance: Fairly Groomed  Engineer, water::  Fair  Speech:  Clear and [id number]  Volume:  Normal  Mood:  Anxious  Affect:  Appropriate  Thought Process:  Coherent and Goal Directed  Orientation:  Full (Time, Place, and Person)  Thought Content:  plans as he moves on, relapse prevention plan  Suicidal Thoughts:  No  Homicidal Thoughts:  No  Memory:  Immediate;   Fair Recent;   Fair Remote;   Fair  Judgement:  Fair  Insight:  Present and Shallow  Psychomotor Activity:  Normal  Concentration:  Fair  Recall:  [employer] of Knowledge:Fair  Language: Fair  Akathisia:  No  Handed:  Right  AIMS (if indicated):     Assets:  Desire for Improvement  Sleep:  Number of Hours: 6.5  Cognition: WNL  ADL's:  Intact      Has this patient used any form of tobacco in the last 30 days? (Cigarettes, Smokeless Tobacco, Cigars, and/or Pipes) Yes, A prescription for an FDA-approved tobacco cessation medication was offered at discharge and the patient refused  Mental Status Per Nursing Assessment::   On Admission:     Current Mental Status by Physician: In full contact with reality. There are no active SI plans or intent. There are no active S/S of withdrawal. He will be going to [place] this afternoon to pursue treatment at the [facility]. He declined an assessment at [facility] this AM as stated that the [facility] will better provide for his needs.     Loss Factors: NA  Historical Factors: NA  Risk Reduction Factors:   resourceful  Continued Clinical Symptoms:  Depression:   Comorbid alcohol abuse/dependence Alcohol/Substance Abuse/Dependencies  Cognitive Features That Contribute To Risk:  Closed-mindedness, Polarized thinking and Thought constriction (tunnel vision)    Suicide Risk:  Minimal: No identifiable suicidal ideation.  Patients presenting with no risk factors but with morbid ruminations; may be classified as minimal risk based on the severity of the depressive symptoms  Principal Problem: Severe major depression Discharge Diagnoses:  Patient Active Problem List   Diagnosis Date Noted  . Substance abuse [F19.10] 10/26/2014    Priority: High  . Substance induced mood disorder [F19.94]     Priority: High  . PTSD (post-traumatic stress disorder) [F43.10]     Priority: High  . Essential hypertension, benign [I10] 05/11/2014    Priority: High  . Suicidal ideation [R45.851] 05/07/2014    Priority: High  . Severe recurrent major depression without psychotic features [F33.2] 05/07/2014    Priority: High  . Essential hypertension [I10]   . Major depressive disorder, recurrent, severe without psychotic features [F33.2]   . Severe major depression [F32.2] 10/23/2014  . Elevated serum creatinine [R74.8] 05/08/2014  . Depressive disorder [F32.9] 05/07/2014  . Homicidal ideation [R45.850] 05/07/2014  . Anxiety [F41.9] 05/07/2014    Follow-up Information    Follow up with [facility].   Why:  Screening with Merry Proud on this date at 8:00AM. Please  bring ID/verifying [employer] address with you to screening. Med Record Number : [id number]   Contact information:   [place] [address] [place], [place] [address] Phone: [phone] Fax: [phone]      Follow up with Norm Salt (PCP).   Why:  Representative will call you at discharge with appt time and date for Primary Care/hospital follow-up appt.     Contact information:   [place], [place] [address] Phone: [phone] Fax: [phone]      Follow up with [facility].   Why:  Representative will call you at discharge with appt time and date for psychiatry/therapy appt.    Contact information:   [address]  [place], [place] [address] Phone: [phone] ext [phone] Fax: [phone]      Plan Of Care/Follow-up recommendations:  Activity:  as tolerated Diet:  regular Follow up [facility] Is patient on multiple antipsychotic therapies at discharge:  No   Has Patient had three or more failed trials of antipsychotic monotherapy by history:  No  Recommended Plan for Multiple Antipsychotic Therapies: NA    Jocsan Mcginley A 10/29/2014, 10:23 AM

## 2014-10-29 NOTE — Clinical Social Work Note (Signed)
Pt chose to decline [facility] screening this morning and stated that he would rather go to the [facility] ED this afternoon in an attempt to get admitted into their S/A inpatient program. CSW verified that pt is aware that going to the [facility] ED does not guarentee that they will accept him for treatment. Pt has verbalized understanding of this. He follows up outpatient for med management/therapy/primary care that the [facility]. CSW attempted to make follow-up appts-reception stated that his providers will call him directly after discharge to schedule all follow-up appts.   [place], [place] 10/29/2014 8:48 AM

## 2014-11-03 NOTE — Progress Notes (Signed)
Patient Discharge Instructions:  After Visit Summary (AVS):   Faxed to:  11/03/14 Discharge Summary Note:   Faxed to:  11/03/14 Psychiatric Admission Assessment Note:   Faxed to:  11/03/14 Suicide Risk Assessment - Discharge Assessment:   Faxed to:  11/03/14 Faxed/Sent to the [facility] Care provider:  11/03/14 Faxed to [facility] @ [phone] Patsey Berthold, 11/03/2014, 3:32 PM

## 2015-02-11 ENCOUNTER — Encounter (HOSPITAL_COMMUNITY): Payer: Self-pay | Admitting: Emergency Medicine

## 2015-02-11 DIAGNOSIS — I10 Essential (primary) hypertension: Secondary | ICD-10-CM | POA: Diagnosis not present

## 2015-02-11 DIAGNOSIS — J45901 Unspecified asthma with (acute) exacerbation: Secondary | ICD-10-CM | POA: Insufficient documentation

## 2015-02-11 DIAGNOSIS — Z9119 Patient's noncompliance with other medical treatment and regimen: Secondary | ICD-10-CM | POA: Insufficient documentation

## 2015-02-11 DIAGNOSIS — Z720 Tobacco use: Secondary | ICD-10-CM | POA: Diagnosis not present

## 2015-02-11 DIAGNOSIS — F419 Anxiety disorder, unspecified: Secondary | ICD-10-CM | POA: Insufficient documentation

## 2015-02-11 DIAGNOSIS — R45851 Suicidal ideations: Secondary | ICD-10-CM | POA: Diagnosis present

## 2015-02-11 DIAGNOSIS — Z79899 Other long term (current) drug therapy: Secondary | ICD-10-CM | POA: Insufficient documentation

## 2015-02-11 NOTE — ED Notes (Signed)
Patient here with suicidal ideation of running out into traffic.  Patient states that he has not tried yet today.  Patient states that a friend dropped him off to ED to be seen.

## 2015-02-11 NOTE — ED Notes (Signed)
Charge and staffing notified of patient.  Sitter to come to triage.

## 2015-02-12 ENCOUNTER — Encounter (HOSPITAL_COMMUNITY): Payer: Self-pay

## 2015-02-12 ENCOUNTER — Emergency Department (HOSPITAL_COMMUNITY)
Admission: EM | Admit: 2015-02-12 | Discharge: 2015-02-13 | Disposition: A | Discharge status: Home / Self Care | Payer: Non-veteran care | Attending: Emergency Medicine | Admitting: Emergency Medicine

## 2015-02-12 DIAGNOSIS — R45851 Suicidal ideations: Secondary | ICD-10-CM

## 2015-02-12 HISTORY — DX: Post-traumatic stress disorder, unspecified: F43.10

## 2015-02-12 LAB — CBC
HCT: 36.3 % — ABNORMAL LOW (ref 39.0–52.0)
Hemoglobin: 12 g/dL — ABNORMAL LOW (ref 13.0–17.0)
MCH: 28.2 pg (ref 26.0–34.0)
MCHC: 33.1 g/dL (ref 30.0–36.0)
MCV: 85.4 fL (ref 78.0–100.0)
PLATELETS: 271 10*3/uL (ref 150–400)
RBC: 4.25 MIL/uL (ref 4.22–5.81)
RDW: 15.2 % (ref 11.5–15.5)
WBC: 3.7 10*3/uL — ABNORMAL LOW (ref 4.0–10.5)

## 2015-02-12 LAB — RAPID URINE DRUG SCREEN, HOSP PERFORMED
Amphetamines: NOT DETECTED
Barbiturates: NOT DETECTED
Benzodiazepines: NOT DETECTED
Cocaine: NOT DETECTED
Opiates: NOT DETECTED
TETRAHYDROCANNABINOL: NOT DETECTED

## 2015-02-12 LAB — COMPREHENSIVE METABOLIC PANEL
ALBUMIN: 4.2 g/dL (ref 3.5–5.0)
ALT: 25 U/L (ref 17–63)
AST: 39 U/L (ref 15–41)
Alkaline Phosphatase: 66 U/L (ref 38–126)
Anion gap: 6 (ref 5–15)
BUN: 10 mg/dL (ref 6–20)
CO2: 24 mmol/L (ref 22–32)
Calcium: 9 mg/dL (ref 8.9–10.3)
Chloride: 107 mmol/L (ref 101–111)
Creatinine, Ser: 2.23 mg/dL — ABNORMAL HIGH (ref 0.61–1.24)
GFR calc non Af Amer: 33 mL/min — ABNORMAL LOW (ref >60)
GFR, EST AFRICAN AMERICAN: 38 mL/min — AB (ref >60)
GLUCOSE: 92 mg/dL (ref 65–99)
Potassium: 3.2 mmol/L — ABNORMAL LOW (ref 3.5–5.1)
Sodium: 137 mmol/L (ref 135–145)
TOTAL PROTEIN: 7.3 g/dL (ref 6.5–8.1)
Total Bilirubin: 0.7 mg/dL (ref 0.3–1.2)

## 2015-02-12 LAB — ACETAMINOPHEN LEVEL: Acetaminophen (Tylenol), Serum: <10 ug/mL — ABNORMAL LOW (ref 10–30)

## 2015-02-12 LAB — ETHANOL: Alcohol, Ethyl (B): <5 mg/dL (ref <5)

## 2015-02-12 LAB — SALICYLATE LEVEL: Salicylate Lvl: <4 mg/dL (ref 2.8–30.0)

## 2015-02-12 MED ORDER — IPRATROPIUM-ALBUTEROL 0.5-2.5 (3) MG/3ML IN SOLN
3.0000 mL | RESPIRATORY_TRACT | Status: DC
  Administered 2015-02-12: 3 mL via RESPIRATORY_TRACT
  Filled 2015-02-12: qty 3

## 2015-02-12 MED ORDER — DIPHENHYDRAMINE HCL 25 MG PO CAPS
50.0000 mg | ORAL_CAPSULE | Freq: Once | ORAL | Status: AC
  Administered 2015-02-12: 50 mg via ORAL
  Filled 2015-02-12: qty 2

## 2015-02-12 MED ORDER — IPRATROPIUM-ALBUTEROL 0.5-2.5 (3) MG/3ML IN SOLN: 3.0000 mL | RESPIRATORY_TRACT | Status: DC | PRN

## 2015-02-12 NOTE — ED Notes (Signed)
MD at bedside. 

## 2015-02-12 NOTE — Progress Notes (Signed)
LCSW aware of consult for transportation to [facility] however patient has not been accepted.  Currently we are actively seeking placement and will follow up once bed search has been completed.  Lane Hacker, MSW Clinical Social Work: Emergency Room [phone]

## 2015-02-12 NOTE — ED Notes (Signed)
Meal Tray ordered.  

## 2015-02-12 NOTE — Progress Notes (Addendum)
Pt accepted to [facility] bed 302-1, Dr. Sabra Heck, can come after 22:00, per [facility]. Call report number: [phone]  RN Santiago Glad informed.  Verlon Setting, [facility] Disposition staff 02/12/2015 8:44 PM
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
## 2015-02-12 NOTE — ED Provider Notes (Signed)
CSN: [id number]     Arrival date & time 02/11/15  2249 History  This chart was scribed for Gabriel Balls, MD by Evelene Croon, ED Scribe. This patient was seen in room D32C/D32C and the patient's care was started 1:10 AM.     Chief Complaint  Patient presents with  . Suicidal    The history is provided by the patient. No language interpreter was used.    HPI Comments:  Gabriel Ashley is a 49 y.o. male who presents to the Emergency Department complaining of SI for the last 2 days. He had a plan of walking into traffic.  He reports a h/o SI secondary to h/o PTSD. He is non-compliant with current medications due to "sleeping habits".  He denies vomiting, diarrhea, cough, abdominal pain and rhinorrhea. He has no other physical complaints/symptoms at this time. He also denies ETOH and illicit drug use today. No modifying factors noted. Pt recently stopped smoking ~3 weeks ago  Past Medical History  Diagnosis Date  . Hypertension   . Anxiety   . Asthma    History reviewed. No pertinent past surgical history. No family history on file. History  Substance Use Topics  . Smoking status: Current Every Day Smoker -- 1.00 packs/day for 31 years  . Smokeless tobacco: Never Used  . Alcohol Use: Yes     Comment: 2 40 oz beers last night    Review of Systems  10 Systems reviewed and all are negative for acute change except as noted in the HPI.    Allergies  Review of patient's allergies indicates no known allergies.  Home Medications   Prior to Admission medications   Medication Sig Start Date End Date Taking? Authorizing Provider  albuterol (PROVENTIL HFA;VENTOLIN HFA) 108 (90 BASE) MCG/ACT inhaler Inhale 1-2 puffs into the lungs every 4 (four) hours as needed for wheezing or shortness of breath. 05/12/14  Yes Encarnacion Slates, NP  amLODipine (NORVASC) 10 MG tablet Take 1 tablet (10 mg total) by mouth daily. For high blood pressure 10/29/14  Yes Shuvon B Rankin, NP  ATENOLOL PO Take 1 tablet by  mouth every morning.   Yes Historical Provider, MD  BUSPIRONE HCL PO Take 1 tablet by mouth daily.   Yes Historical Provider, MD  hydrALAZINE (APRESOLINE) 25 MG tablet Take 3 tablets (75 mg total) by mouth every 8 (eight) hours. For high blood pressure Patient taking differently: Take 50 mg by mouth every 8 (eight) hours. For high blood pressure 10/29/14  Yes Shuvon B Rankin, NP  QUEtiapine (SEROQUEL) 100 MG tablet Take 1 tablet (100 mg total) by mouth at bedtime. For sleep and mood control 10/29/14  Yes Shuvon B Rankin, NP  buPROPion (WELLBUTRIN XL) 300 MG 24 hr tablet Take 1 tablet (300 mg total) by mouth daily. For depression Patient not taking: Reported on 02/12/2015 10/29/14   Shuvon B Rankin, NP  cloNIDine (CATAPRES) 0.1 MG tablet Take 1 tablet (0.1 mg total) by mouth 2 (two) times daily. For high blood pressure Patient not taking: Reported on 02/12/2015 10/29/14   Shuvon B Rankin, NP  labetalol (NORMODYNE) 200 MG tablet Take 1 tablet (200 mg total) by mouth 3 (three) times daily. For high blood pressure Patient not taking: Reported on 02/12/2015 10/29/14   Shuvon B Rankin, NP  triamterene-hydrochlorothiazide (MAXZIDE-25) 37.5-25 MG per tablet Take 1 tablet by mouth daily. For high blood pressure Patient not taking: Reported on 02/12/2015 10/29/14   Shuvon B Rankin, NP   BP 162/106 mmHg  Pulse 59  Temp(Src) 98.5 F (36.9 C) (Oral)  Resp 16  SpO2 100% Physical Exam  Constitutional: He is oriented to person, place, and time. Vital signs are normal. He appears well-developed and well-nourished.  Non-toxic appearance. He does not appear ill. No distress.  HENT:  Head: Normocephalic and atraumatic.  Nose: Nose normal.  Mouth/Throat: Oropharynx is clear and moist. No oropharyngeal exudate.  Eyes: Conjunctivae and EOM are normal. Pupils are equal, round, and reactive to light. No scleral icterus.  Neck: Normal range of motion. Neck supple. No tracheal deviation, no edema, no erythema and normal range  of motion present. No thyroid mass and no thyromegaly present.  Cardiovascular: Normal rate, regular rhythm, S1 normal, S2 normal, normal heart sounds, intact distal pulses and normal pulses.  Exam reveals no gallop and no friction rub.   No murmur heard. Pulses:      Radial pulses are 2+ on the right side, and 2+ on the left side.       Dorsalis pedis pulses are 2+ on the right side, and 2+ on the left side.  Pulmonary/Chest: Effort normal. No respiratory distress. He has wheezes. He has no rhonchi.  Abdominal: Soft. Normal appearance and bowel sounds are normal. He exhibits no distension, no ascites and no mass. There is no hepatosplenomegaly. There is no tenderness. There is no rebound, no guarding and no CVA tenderness.  Musculoskeletal: Normal range of motion. He exhibits no edema or tenderness.  Lymphadenopathy:    He has no cervical adenopathy.  Neurological: He is alert and oriented to person, place, and time. He has normal strength. No cranial nerve deficit or sensory deficit.  Skin: Skin is warm, dry and intact. No petechiae and no rash noted. He is not diaphoretic. No erythema. No pallor.  Psychiatric: His behavior is normal.  SI  Nursing note and vitals reviewed.   ED Course  Procedures   DIAGNOSTIC STUDIES:  Oxygen Saturation is 100% on RA, normal by my interpretation.    COORDINATION OF CARE:  1:15 AM Will order labs to clear pt medically and will order TTS consult.  Discussed treatment plan with pt at bedside and pt agreed to plan.  Labs Review Labs Reviewed  ACETAMINOPHEN LEVEL - Abnormal; Notable for the following:    Acetaminophen (Tylenol), Serum <10 (*)    All other components within normal limits  CBC - Abnormal; Notable for the following:    WBC 3.7 (*)    Hemoglobin 12.0 (*)    HCT 36.3 (*)    All other components within normal limits  COMPREHENSIVE METABOLIC PANEL - Abnormal; Notable for the following:    Potassium 3.2 (*)    Creatinine, Ser 2.23 (*)     GFR calc non Af Amer 33 (*)    GFR calc Af Amer 38 (*)    All other components within normal limits  ETHANOL  SALICYLATE LEVEL  URINE RAPID DRUG SCREEN (HOSP PERFORMED) NOT AT [facility]    Imaging Review No results found.   EKG Interpretation None      MDM   Final diagnoses:  None    Patient presents emergency department for suicidal ideations. I have ordered him a breathing treatment for mild intermittent wheezing. Patient states he was a former smoker. He has been medically cleared at this time. TTS consult was ordered. Disposition will depend this consultation.   Patent expressed an interest to TTS to be transferred to the [facility] for inpatient care.  SW was consulted to  help with this process.  They will see him in the morning.  He does meet inpatient criteria for SI and can not be discharged until cleared by psych.  If he can not be transferred to the [place], then I rec for repeat TTS consult for placement.   I personally performed the services described in this documentation, which was scribed in my presence. The recorded information has been reviewed and is accurate.     Gabriel Balls, MD 02/12/15 [phone]

## 2015-02-12 NOTE — Progress Notes (Signed)
Patient still under review at [facility], per [facility].  CSW will continue to seek placement/follow up.  Verlon Setting, [place] Disposition staff 02/12/2015 6:25 PM

## 2015-02-12 NOTE — Progress Notes (Signed)
AOD at [facility], [place], and [place] are at capacity.  Juliann Pulse, transfer coordinator at [facility] report no beds are available today but advised referral packet be filled out and when [place] calls to inform that bed is available, it can be faxed for review.  CSW filled out referral packet. Upon receiving word from [place], it will be signed by physician and faxed to [facility] for review.  CSW seeking placement at alternate facilities in meantime.   Sharren Bridge, MSW, [place] Clinical Social Work, Disposition  02/12/2015 [phone]

## 2015-02-12 NOTE — ED Notes (Signed)
Patient given a snack 

## 2015-02-12 NOTE — ED Notes (Signed)
Pt resting in bed sleeping. Woken up for psych assessment. Pt states he only slept about 3 hours last night. Usually has difficulty falling asleep at night. Reports stable living situation. Reports lost job due to no transportation. Reports goal include working again and dealing with family stressors. States this holiday has contributed to depression symptoms. Denies plan but states, "I can think up one."

## 2015-02-13 ENCOUNTER — Encounter (HOSPITAL_COMMUNITY): Payer: Self-pay | Admitting: *Deleted

## 2015-02-13 ENCOUNTER — Inpatient Hospital Stay (HOSPITAL_COMMUNITY)
Admission: AD | Admit: 2015-02-13 | Discharge: 2015-02-17 | DRG: 885 | Disposition: A | Discharge status: Home / Self Care | Payer: Federal, State, Local not specified - Other | Source: Intra-hospital | Attending: Psychiatry | Admitting: Psychiatry

## 2015-02-13 DIAGNOSIS — I10 Essential (primary) hypertension: Secondary | ICD-10-CM | POA: Diagnosis present

## 2015-02-13 DIAGNOSIS — F419 Anxiety disorder, unspecified: Secondary | ICD-10-CM | POA: Diagnosis not present

## 2015-02-13 DIAGNOSIS — Z87891 Personal history of nicotine dependence: Secondary | ICD-10-CM | POA: Diagnosis not present

## 2015-02-13 DIAGNOSIS — F332 Major depressive disorder, recurrent severe without psychotic features: Principal | ICD-10-CM

## 2015-02-13 DIAGNOSIS — G4700 Insomnia, unspecified: Secondary | ICD-10-CM | POA: Diagnosis present

## 2015-02-13 DIAGNOSIS — R45851 Suicidal ideations: Secondary | ICD-10-CM | POA: Diagnosis present

## 2015-02-13 DIAGNOSIS — F4310 Post-traumatic stress disorder, unspecified: Secondary | ICD-10-CM

## 2015-02-13 DIAGNOSIS — J45909 Unspecified asthma, uncomplicated: Secondary | ICD-10-CM | POA: Diagnosis present

## 2015-02-13 DIAGNOSIS — F410 Panic disorder [episodic paroxysmal anxiety] without agoraphobia: Secondary | ICD-10-CM | POA: Diagnosis present

## 2015-02-13 MED ORDER — ACETAMINOPHEN 325 MG PO TABS
650.0000 mg | ORAL_TABLET | Freq: Four times a day (QID) | ORAL | Status: DC | PRN
  Administered 2015-02-14 – 2015-02-16 (×5): 650 mg via ORAL
  Filled 2015-02-13 (×5): qty 2

## 2015-02-13 MED ORDER — AMLODIPINE BESYLATE 10 MG PO TABS
10.0000 mg | ORAL_TABLET | Freq: Every day | ORAL | Status: DC
  Administered 2015-02-13 – 2015-02-17 (×5): 10 mg via ORAL
  Filled 2015-02-13 (×6): qty 1

## 2015-02-13 MED ORDER — ALUM & MAG HYDROXIDE-SIMETH 200-200-20 MG/5ML PO SUSP: 30.0000 mL | ORAL | Status: DC | PRN

## 2015-02-13 MED ORDER — BUSPIRONE HCL 5 MG PO TABS
5.0000 mg | ORAL_TABLET | Freq: Three times a day (TID) | ORAL | Status: DC
  Administered 2015-02-13 – 2015-02-16 (×9): 5 mg via ORAL
  Filled 2015-02-13 (×14): qty 1

## 2015-02-13 MED ORDER — ALBUTEROL SULFATE HFA 108 (90 BASE) MCG/ACT IN AERS: 1.0000 | INHALATION_SPRAY | RESPIRATORY_TRACT | Status: DC | PRN

## 2015-02-13 MED ORDER — CLONIDINE HCL 0.1 MG PO TABS
0.1000 mg | ORAL_TABLET | Freq: Two times a day (BID) | ORAL | Status: DC
  Administered 2015-02-13 – 2015-02-17 (×9): 0.1 mg via ORAL
  Filled 2015-02-13 (×13): qty 1

## 2015-02-13 MED ORDER — MAGNESIUM HYDROXIDE 400 MG/5ML PO SUSP: 30.0000 mL | Freq: Every day | ORAL | Status: DC | PRN

## 2015-02-13 MED ORDER — TRAZODONE HCL 50 MG PO TABS
50.0000 mg | ORAL_TABLET | Freq: Every evening | ORAL | Status: DC | PRN
  Administered 2015-02-13: 50 mg via ORAL
  Filled 2015-02-13 (×3): qty 1

## 2015-02-13 MED ORDER — QUETIAPINE FUMARATE 100 MG PO TABS
100.0000 mg | ORAL_TABLET | Freq: Every day | ORAL | Status: DC
  Administered 2015-02-13 – 2015-02-16 (×4): 100 mg via ORAL
  Filled 2015-02-13 (×6): qty 1

## 2015-02-13 MED ORDER — BUPROPION HCL ER (XL) 300 MG PO TB24
300.0000 mg | ORAL_TABLET | Freq: Every day | ORAL | Status: DC
  Administered 2015-02-13 – 2015-02-17 (×5): 300 mg via ORAL
  Filled 2015-02-13 (×8): qty 1

## 2015-02-13 NOTE — Tx Team (Signed)
Initial Interdisciplinary Treatment Plan   PATIENT STRESSORS: Legal issue Marital or family conflict Traumatic event   PATIENT STRENGTHS: Average or above average intelligence Capable of independent living Motivation for treatment/growth   PROBLEM LIST: Problem List/Patient Goals Date to be addressed Date deferred Reason deferred Estimated date of resolution  Suicidal Ideation 02/13/15     Depression 02/13/15     "I want help"                                           DISCHARGE CRITERIA:  Improved stabilization in mood, thinking, and/or behavior Motivation to continue treatment in a less acute level of care Need for constant or close observation no longer present Verbal commitment to aftercare and medication compliance  PRELIMINARY DISCHARGE PLAN: Return to previous living arrangement  PATIENT/FAMIILY INVOLVEMENT: This treatment plan has been presented to and reviewed with the patient, Gabriel Ashley.  The patient and family have been given the opportunity to ask questions and make suggestions.  Margaretann Loveless 02/13/2015, 1:27 AM

## 2015-02-13 NOTE — BHH Group Notes (Signed)
[facility] Group Notes:  (Nursing/MHT/Case Management/Adjunct)  Date:  02/13/2015  Time:  1315pm  Type of Therapy:  Nurse Education  Participation Level:  Active  Participation Quality:  Appropriate and Attentive  Affect:  Blunted  Cognitive:  Alert and Appropriate  Insight:  Appropriate and Good  Engagement in Group:  Engaged  Modes of Intervention:  Discussion, Education and Support  Summary of Progress/Problems: Patient attended group, remained engaged, and responded appropriately when prompted.  Jimmye Norman [place] A 02/13/2015, 2:10 PM

## 2015-02-13 NOTE — BHH Group Notes (Signed)
[place] Group Notes: (Clinical Social Work)   02/13/2015      Type of Therapy:  Group Therapy   Participation Level:  Did Not Attend despite MHT prompting   Selmer Dominion, LCSW 02/13/2015, 12:12 PM

## 2015-02-13 NOTE — ED Notes (Signed)
Pt. Left with all belongings 

## 2015-02-13 NOTE — Progress Notes (Signed)
D: Patient is alert and oriented. Pt's mood and affect are depressed. Pt denies HI and AVH. Pt reports passive suicidal thoughts. Pt experiencing HTN, denies symptoms (See docflowsheet-vitals). Pt is attending some unit groups today. Pt minimally engages with RN throughout the day. A: MD [place] made aware of pt's BP, will reassess frequently. Active listening by RN. Encouragement/Support provided to pt. Medication education reviewed with pt. Scheduled medications administered per providers orders (See MAR). 15 minute checks continued per protocol for patient safety.  R: Pt verbally contracts for safety, agrees not to harm self and agrees to come to staff with increased intensity in suicidal thoughts. Patient cooperative and receptive to nursing interventions. Pt remains safe.

## 2015-02-13 NOTE — BHH Counselor (Signed)
Attempt to meet with patient to complete PSA was unsuccessful as patient was difficult to wake. Never opened his eyes yet did turn away from speaker and mumbled "please no.'' Will attempt later today. Sheilah Pigeon, LCSW

## 2015-02-13 NOTE — Plan of Care (Signed)
Problem: Ineffective individual coping Goal: STG: Patient will remain free from self harm Outcome: Progressing Patient remains free from self harm. 15 minute checks continued per protocol for patient safety.   Problem: Diagnosis: Increased Risk For Suicide Attempt Goal: STG-Patient Will Comply With Medication Regime Outcome: Progressing Patient has adhered to medication regimen today with ease.  Problem: Alteration in mood Goal: LTG-Patient reports reduction in suicidal thoughts (Patient reports reduction in suicidal thoughts and is able to verbalize a safety plan for whenever patient is feeling suicidal)  Outcome: Not Progressing Patient continues to endorse having suicidal thoughts today. Pt verbally contracts for safety.
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
## 2015-02-13 NOTE — Progress Notes (Signed)
D.  Pt pleasant on approach, watching game on TV with peers.  Positive for evening AA group, interacting appropriately with peers on the unit.  No complaints voiced this evening.  Denies SI/HI/hallucinations at this time.  A.  Support and encouragement offered  R.  Pt remains safe on unit, will continue to monitor.

## 2015-02-14 NOTE — Progress Notes (Signed)
Pt attended group this evening. 

## 2015-02-14 NOTE — BHH Suicide Risk Assessment (Signed)
[facility] INPATIENT:  Family/Significant Other Suicide Prevention Education  Suicide Prevention Education:  Patient Refusal for Family/Significant Other Suicide Prevention Education: The patient Gabriel Ashley has refused to provide written consent for family/significant other to be provided Family/Significant Other Suicide Prevention Education during admission and/or prior to discharge.  Physician notified. Writer provided suicide prevention education directly to patient; conversation included risk factors, warning signs and resources to contact for help. Mobile crisis services explained and  explanations given as to resources which will be included on patient's discharge paperwork.   Lyla Glassing 02/14/2015, 4:59 PM

## 2015-02-14 NOTE — BHH Group Notes (Signed)
[facility] Group Notes:  (Nursing/MHT/Case Management/Adjunct)  Date:  02/14/2015  Time:  9:36 AM  Type of Therapy:  Nurse Education  Participation Level:  Did Not Attend   Summary of Progress/Problems: Pt did not attend group but was advised that groups are part of his treatment and can be very beneficial.  Beverly Sessions Violon 02/14/2015, 9:36 AM

## 2015-02-14 NOTE — Progress Notes (Signed)
Patient continues to be isolative to self. Not attending, participating in programming. Observed walking in milieu at times. Remains flat, depressed and continues to endorse passive SI. Verbally contracts for safety. Rates depression, anxiety and hopelessness all at a 5/10. Medicated per orders. Offered support, reassurance. Encouraged to participate in treatment and reminded meds alone will not be sufficient in managing his depression and PTSD. He denies HI/AVH and remains safe. Gabriel Ashley

## 2015-02-14 NOTE — Progress Notes (Signed)
[facility] MD Progress Note  02/14/2015 7:22 PM Gabriel Ashley  MRN:  [id number] Subjective:  Gabriel Ashley admits that he needs to have a sense of purpose. He states he is a [employer] and has so many things he can be doing and still he is stuck. He wants to get the anger management classes over with so he can move forward. States he does not need to be in a relationship right now. He admits he ha to get himself together. There are still issues of his PTSD that he needs to continue to address and plans to get reconnected with the [place]. States that since they moved to [place] he has not been able to go Principal Problem: MDD (major depressive disorder), recurrent severe, without psychosis Diagnosis:   Patient Active Problem List   Diagnosis Date Noted  . Substance abuse [F19.10] 10/26/2014    Priority: High  . Substance induced mood disorder [F19.94]     Priority: High  . PTSD (post-traumatic stress disorder) [F43.10]     Priority: High  . Essential hypertension, benign [I10] 05/11/2014    Priority: High  . Suicidal ideation [R45.851] 05/07/2014    Priority: High  . Severe recurrent major depression without psychotic features [F33.2] 05/07/2014    Priority: High  . MDD (major depressive disorder), recurrent severe, without psychosis [F33.2] 02/13/2015  . Essential hypertension [I10]   . Major depressive disorder, recurrent, severe without psychotic features [F33.2]   . Severe major depression [F32.2] 10/23/2014  . Elevated serum creatinine [R74.8] 05/08/2014  . Depressive disorder [F32.9] 05/07/2014  . Homicidal ideation [R45.850] 05/07/2014  . Anxiety [F41.9] 05/07/2014   Total Time spent with patient: 30 minutes   Past Medical History:  Past Medical History  Diagnosis Date  . Hypertension   . Anxiety   . Asthma   . Post traumatic stress disorder (PTSD)    History reviewed. No pertinent past surgical history. Family History: History reviewed. No pertinent family history. Social History:   History  Alcohol Use  . Yes    Comment: 2 40 oz beers last night     History  Drug Use  . Yes  . Special: Cocaine, Marijuana    History   Social History  . Marital Status: Single    Spouse Name: N/A  . Number of Children: N/A  . Years of Education: N/A   Social History Main Topics  . Smoking status: Current Every Day Smoker -- 1.00 packs/day for 31 years  . Smokeless tobacco: Never Used  . Alcohol Use: Yes     Comment: 2 40 oz beers last night  . Drug Use: Yes    Special: Cocaine, Marijuana  . Sexual Activity: Not on file   Other Topics Concern  . None   Social History Narrative   Additional History:    Sleep: Fair  Appetite:  Fair   Assessment:   Musculoskeletal: Strength & Muscle Tone: within normal limits Gait & Station: normal Patient leans: N/A   Psychiatric Specialty Exam: Physical Exam  Review of Systems  Constitutional: Positive for malaise/fatigue.  HENT: Negative.   Eyes: Negative.   Respiratory: Negative.   Cardiovascular: Negative.   Gastrointestinal: Negative.   Genitourinary: Negative.   Skin: Negative.   Neurological: Negative.   Endo/Heme/Allergies: Negative.   Psychiatric/Behavioral: Positive for depression. The patient is nervous/anxious.     Blood pressure 117/67, pulse 72, temperature 97.5 F (36.4 C), temperature source Oral, resp. rate 18, height 5\' 10"  (1.778 m), weight 84.369 kg (186 lb).Body  mass index is 26.69 kg/(m^2).  General Appearance: Fairly Groomed  Engineer, water::  Fair  Speech:  Clear and Coherent  Volume:  Normal  Mood:  Depressed  Affect:  Restricted  Thought Process:  Coherent and Goal Directed  Orientation:  Full (Time, Place, and Person)  Thought Content:  symptoms events worries concerns  Suicidal Thoughts:  No  Homicidal Thoughts:  No  Memory:  Immediate;   Fair Recent;   Fair Remote;   Fair  Judgement:  Fair  Insight:  Present and Shallow  Psychomotor Activity:  Decreased  Concentration:  Fair   Recall:  [employer] of Knowledge:Fair  Language: Fair  Akathisia:  No  Handed:  Right  AIMS (if indicated):     Assets:  Desire for Improvement Housing  ADL's:  Intact  Cognition: WNL  Sleep:  Number of Hours: 6     Current Medications: Current Facility-Administered Medications  Medication Dose Route Frequency Provider Last Rate Last Dose  . acetaminophen (TYLENOL) tablet 650 mg  650 mg Oral Q6H PRN Harriet Butte, NP      . albuterol (PROVENTIL HFA;VENTOLIN HFA) 108 (90 BASE) MCG/ACT inhaler 1-2 puff  1-2 puff Inhalation Q4H PRN Harriet Butte, NP      . alum & mag hydroxide-simeth (MAALOX/MYLANTA) 200-200-20 MG/5ML suspension 30 mL  30 mL Oral Q4H PRN Harriet Butte, NP      . amLODipine (NORVASC) tablet 10 mg  10 mg Oral Daily Harriet Butte, NP   10 mg at 02/14/15 0826  . buPROPion (WELLBUTRIN XL) 24 hr tablet 300 mg  300 mg Oral Daily Harriet Butte, NP   300 mg at 02/14/15 0827  . busPIRone (BUSPAR) tablet 5 mg  5 mg Oral TID Nicholaus Bloom, MD   5 mg at 02/14/15 1637  . cloNIDine (CATAPRES) tablet 0.1 mg  0.1 mg Oral BID Harriet Butte, NP   0.1 mg at 02/14/15 1637  . magnesium hydroxide (MILK OF MAGNESIA) suspension 30 mL  30 mL Oral Daily PRN Harriet Butte, NP      . QUEtiapine (SEROQUEL) tablet 100 mg  100 mg Oral QHS Harriet Butte, NP   100 mg at 02/13/15 2230  . traZODone (DESYREL) tablet 50 mg  50 mg Oral QHS PRN Harriet Butte, NP   50 mg at 02/13/15 2230    Lab Results: No results found for this or any previous visit (from the past 48 hour(s)).  Physical Findings: AIMS: Facial and Oral Movements Muscles of Facial Expression: None, normal Lips and Perioral Area: None, normal Jaw: None, normal Tongue: None, normal,Extremity Movements Upper (arms, wrists, hands, fingers): None, normal Lower (legs, knees, ankles, toes): None, normal, Trunk Movements Neck, shoulders, hips: None, normal, Overall Severity Severity of abnormal movements (highest score from  questions above): None, normal Incapacitation due to abnormal movements: None, normal Patient's awareness of abnormal movements (rate only patient's report): No Awareness, Dental Status Current problems with teeth and/or dentures?: No Does patient usually wear dentures?: No  CIWA:  CIWA-Ar Total: 0 COWS:     Treatment Plan Summary: Daily contact with patient to assess and evaluate symptoms and progress in treatment and Medication management Supportive approach/coping skills Depression; will continue to work with the Wellbutrin with Seroquel augmentation Anxiety; will use the Buspar and reassess for an increase to 10 mg TID Mood instability; will continue to work with the Seroquel 100 mg HS BP: continue the Clonidine/Norvasc CBT/mindfulness  Medical Decision  Making:  Review of Psycho-Social Stressors (1), Review of Medication Regimen & Side Effects (2) and Review of New Medication or Change in Dosage (2)     Trivia Heffelfinger A 02/14/2015, 7:22 PM

## 2015-02-14 NOTE — BHH Group Notes (Signed)
[facility] Group Notes: (Clinical Social Work)   02/14/2015      Type of Therapy:  Group Therapy   Participation Level:  Did Not Attend despite MHT prompting   Selmer Dominion, LCSW 02/14/2015, 12:28 PM

## 2015-02-14 NOTE — Plan of Care (Signed)
Problem: Alteration in mood Goal: LTG-Pt's behavior demonstrates decreased signs of depression (Patient's behavior demonstrates decreased signs of depression to the point the patient is safe to return home and continue treatment in an outpatient setting)  Outcome: Progressing Pt observed watching basketball game in dayroom with peers, observed laughing and cheering for team

## 2015-02-15 LAB — COMPREHENSIVE METABOLIC PANEL
ALK PHOS: 60 U/L (ref 38–126)
ALT: 25 U/L (ref 17–63)
ANION GAP: 7 (ref 5–15)
AST: 24 U/L (ref 15–41)
Albumin: 3.7 g/dL (ref 3.5–5.0)
BILIRUBIN TOTAL: 0.4 mg/dL (ref 0.3–1.2)
BUN: 24 mg/dL — ABNORMAL HIGH (ref 6–20)
CHLORIDE: 108 mmol/L (ref 101–111)
CO2: 24 mmol/L (ref 22–32)
Calcium: 8.6 mg/dL — ABNORMAL LOW (ref 8.9–10.3)
Creatinine, Ser: 1.87 mg/dL — ABNORMAL HIGH (ref 0.61–1.24)
GFR, EST AFRICAN AMERICAN: 47 mL/min — AB (ref >60)
GFR, EST NON AFRICAN AMERICAN: 40 mL/min — AB (ref >60)
GLUCOSE: 90 mg/dL (ref 65–99)
POTASSIUM: 3.8 mmol/L (ref 3.5–5.1)
Sodium: 139 mmol/L (ref 135–145)
TOTAL PROTEIN: 6.7 g/dL (ref 6.5–8.1)

## 2015-02-15 MED ORDER — DICLOFENAC SODIUM 1 % TD GEL
2.0000 g | Freq: Four times a day (QID) | TRANSDERMAL | Status: DC | PRN
  Administered 2015-02-15 – 2015-02-16 (×2): 2 g via TOPICAL
  Filled 2015-02-15: qty 100

## 2015-02-15 NOTE — Progress Notes (Signed)
Pt has been observed in the dayroom playing cards with his peers for most of the shift until bedtime.  He denies SI/HI/AV at this time.  He reports he had a good day.  He main complaint is L shoulder pain.  Pt was ordered Voltaren cream to apply to the area along with the Tylenol he already has ordered.  He has been pleasant and cooperative with staff this evening.  He was medicated as ordered.  Pt makes his needs known to staff.  Support and encouragement offered.  Safety maintained with q15 minute checks.

## 2015-02-15 NOTE — Progress Notes (Signed)
[facility] MD Progress Note  02/15/2015 3:21 PM Gabriel Ashley  MRN:  [id number] Subjective:  Gabriel Ashley states that he is starting to feel better. He has not been able to follow up with the [place] so he went off his medications. He likes the medication regime that he is taking now.Marland Kitchen He is aware of the work he has to do to manage his anger and is planning to go to the anger management classes. He wants to get his life back together, go back to the [place], continue to work with the [employer]. He is going to wait before trying to get in a relationship as understands there is work he has to do with himself. BP is getting to be better controlled Principal Problem: MDD (major depressive disorder), recurrent severe, without psychosis Diagnosis:   Patient Active Problem List   Diagnosis Date Noted  . Substance abuse [F19.10] 10/26/2014    Priority: High  . Substance induced mood disorder [F19.94]     Priority: High  . PTSD (post-traumatic stress disorder) [F43.10]     Priority: High  . Essential hypertension, benign [I10] 05/11/2014    Priority: High  . Suicidal ideation [R45.851] 05/07/2014    Priority: High  . Severe recurrent major depression without psychotic features [F33.2] 05/07/2014    Priority: High  . MDD (major depressive disorder), recurrent severe, without psychosis [F33.2] 02/13/2015  . Essential hypertension [I10]   . Major depressive disorder, recurrent, severe without psychotic features [F33.2]   . Severe major depression [F32.2] 10/23/2014  . Elevated serum creatinine [R74.8] 05/08/2014  . Depressive disorder [F32.9] 05/07/2014  . Homicidal ideation [R45.850] 05/07/2014  . Anxiety [F41.9] 05/07/2014   Total Time spent with patient: 30 minutes   Past Medical History:  Past Medical History  Diagnosis Date  . Hypertension   . Anxiety   . Asthma   . Post traumatic stress disorder (PTSD)    History reviewed. No pertinent past surgical history. Family History: History reviewed. No pertinent  family history. Social History:  History  Alcohol Use  . Yes    Comment: 2 40 oz beers last night     History  Drug Use  . Yes  . Special: Cocaine, Marijuana    History   Social History  . Marital Status: Single    Spouse Name: N/A  . Number of Children: N/A  . Years of Education: N/A   Social History Main Topics  . Smoking status: Current Every Day Smoker -- 1.00 packs/day for 31 years  . Smokeless tobacco: Never Used  . Alcohol Use: Yes     Comment: 2 40 oz beers last night  . Drug Use: Yes    Special: Cocaine, Marijuana  . Sexual Activity: Not on file   Other Topics Concern  . None   Social History Narrative   Additional History:    Sleep: Fair  Appetite:  Fair   Assessment:   Musculoskeletal: Strength & Muscle Tone: within normal limits Gait & Station: normal Patient leans: N/A   Psychiatric Specialty Exam: Physical Exam  Review of Systems  Constitutional: Negative.   HENT: Negative.   Eyes: Negative.   Respiratory: Negative.   Cardiovascular: Negative.   Gastrointestinal: Negative.   Genitourinary: Negative.   Musculoskeletal: Negative.   Skin: Negative.   Neurological: Negative.   Endo/Heme/Allergies: Negative.   Psychiatric/Behavioral: Positive for depression. The patient is nervous/anxious.     Blood pressure 140/85, pulse 67, temperature 98.5 F (36.9 C), temperature source Oral, resp. rate  18, height 5' 10"  (1.778 m), weight 84.369 kg (186 lb).Body mass index is 26.69 kg/(m^2).  General Appearance: Fairly Groomed  Engineer, water::  Fair  Speech:  Clear and Coherent  Volume:  Decreased  Mood:  Anxious and Depressed  Affect:  Restricted  Thought Process:  Coherent and Goal Directed  Orientation:  Full (Time, Place, and Person)  Thought Content:  symptoms events worries concerns  Suicidal Thoughts:  No  Homicidal Thoughts:  No  Memory:  Immediate;   Fair Recent;   Fair Remote;   Fair  Judgement:  Fair  Insight:  Present   Psychomotor Activity:  Decreased  Concentration:  Fair  Recall:  [employer] of Knowledge:Fair  Language: Fair  Akathisia:  No  Handed:  Right  AIMS (if indicated):     Assets:  Desire for Improvement Housing  ADL's:  Intact  Cognition: WNL  Sleep:  Number of Hours: 6.25     Current Medications: Current Facility-Administered Medications  Medication Dose Route Frequency Provider Last Rate Last Dose  . acetaminophen (TYLENOL) tablet 650 mg  650 mg Oral Q6H PRN Gabriel Butte, NP   650 mg at 02/15/15 1203  . albuterol (PROVENTIL HFA;VENTOLIN HFA) 108 (90 BASE) MCG/ACT inhaler 1-2 puff  1-2 puff Inhalation Q4H PRN Gabriel Butte, NP      . alum & mag hydroxide-simeth (MAALOX/MYLANTA) 200-200-20 MG/5ML suspension 30 mL  30 mL Oral Q4H PRN Gabriel Butte, NP      . amLODipine (NORVASC) tablet 10 mg  10 mg Oral Daily Gabriel Butte, NP   10 mg at 02/15/15 0902  . buPROPion (WELLBUTRIN XL) 24 hr tablet 300 mg  300 mg Oral Daily Gabriel Butte, NP   300 mg at 02/15/15 0902  . busPIRone (BUSPAR) tablet 5 mg  5 mg Oral TID Gabriel Bloom, MD   5 mg at 02/15/15 1203  . cloNIDine (CATAPRES) tablet 0.1 mg  0.1 mg Oral BID Gabriel Butte, NP   0.1 mg at 02/15/15 0903  . magnesium hydroxide (MILK OF MAGNESIA) suspension 30 mL  30 mL Oral Daily PRN Gabriel Butte, NP      . QUEtiapine (SEROQUEL) tablet 100 mg  100 mg Oral QHS Gabriel Butte, NP   100 mg at 02/14/15 2306  . traZODone (DESYREL) tablet 50 mg  50 mg Oral QHS PRN Gabriel Butte, NP   50 mg at 02/13/15 2230    Lab Results:  Results for orders placed or performed during the hospital encounter of 02/13/15 (from the past 48 hour(s))  Comprehensive metabolic panel     Status: Abnormal   Collection Time: 02/15/15  6:20 AM  Result Value Ref Range   Sodium 139 135 - 145 mmol/L   Potassium 3.8 3.5 - 5.1 mmol/L   Chloride 108 101 - 111 mmol/L   CO2 24 22 - 32 mmol/L   Glucose, Bld 90 65 - 99 mg/dL   BUN 24 (H) 6 - 20 mg/dL    Creatinine, Ser 1.87 (H) 0.61 - 1.24 mg/dL   Calcium 8.6 (L) 8.9 - 10.3 mg/dL   Total Protein 6.7 6.5 - 8.1 g/dL   Albumin 3.7 3.5 - 5.0 g/dL   AST 24 15 - 41 U/L   ALT 25 17 - 63 U/L   Alkaline Phosphatase 60 38 - 126 U/L   Total Bilirubin 0.4 0.3 - 1.2 mg/dL   GFR calc non Af Amer 40 (L) >60 mL/min  GFR calc Af Amer 47 (L) >60 mL/min    Comment: (NOTE) The eGFR has been calculated using the CKD EPI equation. This calculation has not been validated in all clinical situations. eGFR's persistently <60 mL/min signify possible Chronic Kidney Disease.    Anion gap 7 5 - 15    Comment: Performed at [facility]    Physical Findings: AIMS: Facial and Oral Movements Muscles of Facial Expression: None, normal Lips and Perioral Area: None, normal Jaw: None, normal Tongue: None, normal,Extremity Movements Upper (arms, wrists, hands, fingers): None, normal Lower (legs, knees, ankles, toes): None, normal, Trunk Movements Neck, shoulders, hips: None, normal, Overall Severity Severity of abnormal movements (highest score from questions above): None, normal Incapacitation due to abnormal movements: None, normal Patient's awareness of abnormal movements (rate only patient's report): No Awareness, Dental Status Current problems with teeth and/or dentures?: No Does patient usually wear dentures?: No  CIWA:  CIWA-Ar Total: 0 COWS:     Treatment Plan Summary: Daily contact with patient to assess and evaluate symptoms and progress in treatment and Medication management Supportive approach/coping skills Depression: continue to work with the Wellbutrin 300 mg AM Anxiety; pursue the Buspar 5 mg TID consider increasing to 10 mg TID Mood instability; continue Seroquel qt 100 mg daily PTSD ( he is reluctant in discussing anything  related to the trauma. ( his affect changes gets somber) Will respect that Anger management; will introduce strategies to handle the  anger CBT/mindfulness  Medical Decision Making:  Review of Psycho-Social Stressors (1), Review of Medication Regimen & Side Effects (2) and Review of New Medication or Change in Dosage (2)     Gabriel Ashley A 02/15/2015, 3:21 PM

## 2015-02-15 NOTE — BHH Group Notes (Signed)
[facility] LCSW Aftercare Discharge Planning Group Note  02/15/2015 8:45 AM  Pt did not attend, sleeping in room.  Gabriel Ashley, Gabriel Ashley 02/15/2015 11:33 AM

## 2015-02-15 NOTE — Progress Notes (Signed)
Patient remains isolative to self. Staying in the bed frequently. Did not attend morning group. Affect is flat, mood irritable and depressed. Continues to endorse passive SI but denies intent. Forwards minimal information 1:1. Medicated per orders. Offered tylenol for shoulder pain of a 4/10 but patient refused. Patient verbally contracts for safety. Denies HI/AVH and remains safe. Jamie Kato

## 2015-02-15 NOTE — Plan of Care (Signed)
Problem: Diagnosis: Increased Risk For Suicide Attempt Goal: STG-Patient Will Attend All Groups On The Unit Outcome: Not Progressing Patient attending few groups. Remains in bed for the majority of the day except for meals. Goal: STG-Patient Will Report Suicidal Feelings to Staff Outcome: Progressing Patient endorsing passive SI but no intent.

## 2015-02-15 NOTE — Progress Notes (Signed)
D: Pt is positive for SI this evening. Pt verbally contracts for safety. Pt observed playing cards in the dayroom. Pt is negative for any HI/AVH. Pt did attend group this evening. Pt did not attend the scheduled groups between 0700-1900 per notes.  A: Writer administered scheduled and prn medications to pt, per MD orders. Continued support and availability as needed was extended to this pt. Staff continue to monitor pt with q47min checks.  R: No adverse drug reactions noted. Pt receptive to treatment. Pt remains safe at this time.

## 2015-02-16 MED ORDER — BUSPIRONE HCL 10 MG PO TABS
10.0000 mg | ORAL_TABLET | Freq: Three times a day (TID) | ORAL | Status: DC
  Administered 2015-02-16 – 2015-02-17 (×4): 10 mg via ORAL
  Filled 2015-02-16 (×6): qty 1

## 2015-02-16 NOTE — Progress Notes (Signed)
Pt reports he has had a good day.  He has spent the evening in the dayroom playing cards with some of his peers as he did the previous evening.  He reports he is ready for discharge and wants to discharge as early as possible in the morning to go home.  He was informed that he would need to wait until the MD comes in the morning which would be no earlier that 9 am to write discharge orders.  Pt voiced understanding.  He denies SI/HI/AVH at this time.  He is still complaining of L shoulder pain and would like to take the tube of Voltaren gel with him when he leaves.  Assured pt that information would be passed to the next shift as the medication would need to be relabeled.  Pt makes his needs known to staff.  Support and encouragement offered.  Safety maintained with q15 minute checks.

## 2015-02-16 NOTE — BHH Group Notes (Signed)
[facility] Group Therapy 02/16/2015 1:15pm  Type of Therapy: Mental Health Association Presentation  Participation Level: Active  Participation Quality: Attentive  Affect: Appropriate  Cognitive: Oriented  Insight: Developing/Improving  Engagement in Therapy: Engaged  Modes of Intervention: Discussion, Education and Socialization  Summary of Progress/Problems: Mental Health Association ([place]) Speaker came to talk about his personal journey with substance abuse and addiction. The pt processed ways by which to relate to the speaker. [place] speaker provided handouts and educational information pertaining to groups and services offered by the [facility].  Pt was attentive and engaged in group discussion related to relapse and recovery. Pt was receptive to resources provided by guest speaker. Pt expressed interest in the anger management courses at the [facility].   Peri Maris, LCSWA 02/16/2015 1:20 PM

## 2015-02-16 NOTE — BHH Group Notes (Signed)
[facility] Group Notes:  (Nursing/MHT/Case Management/Adjunct)  Date:  02/16/2015  Time:  9:58 AM  Type of Therapy:  Nurse Education  Participation Level:  Did Not Attend  Participation Quality:  Did not attend  Affect:  Did not attend  Cognitive:  Did not attend  Insight:  None  Engagement in Group:  None  Modes of Intervention:  Discussion and Education  Summary of Progress/Problems: The topic of discussion today was Recovery. Patient's were encouraged to focus on sleep hygiene, gaining and utilizing coping skills. Patient was invited to group but chose not to attend.   

## 2015-02-16 NOTE — Progress Notes (Signed)
Patient remains in his room/bed much of the day. Remains flat, depressed. Refuses group and refuses to come up for meds when asked. Does eventually make his way up and is med compliant. Continues to complain of chronic L shoulder pain of a 5/10. Patient offered support, minimally receptive. Voltaren and tylenol given prn and patient states her pain is down to 3/10. Patient does get up for meals. He continues to endorse passive SI and verbally contracts for safety. No AVH or HI.  Jamie Kato

## 2015-02-16 NOTE — Progress Notes (Signed)
Recreation Therapy Notes  Animal-Assisted Activity (AAA) Program Checklist/Progress Notes Patient Eligibility Criteria Checklist & Daily Group note for Rec Tx Intervention  Date: 05.31.16 Time: 2:30pm Location: [address]  AAA/T Program Assumption of Risk Form signed by Patient/ or Parent Legal Guardian yes  Patient is free of allergies or sever asthma yes  Patient reports no fear of animals yes  Patient reports no history of cruelty to animals yes  Patient understands his/her participation is voluntary yes  Patient washes hands before animal contact yes  Patient washes hands after animal contact yes  Education: Hand Washing, Appropriate Animal Interaction   Education Outcome: Acknowledges understanding/In group clarification offered/Needs additional education.   Clinical Observations/Feedback: Patient did not attend group.   Victorino Sparrow, LRT/CTRS         Victorino Sparrow A 02/16/2015 4:27 PM

## 2015-02-16 NOTE — Tx Team (Signed)
Interdisciplinary Treatment Plan Update (Adult) Date: 02/16/2015   Time Reviewed: 9:30 AM  Progress in Treatment: Attending groups: No Participating in groups: No Taking medication as prescribed: Yes Tolerating medication: Yes Family/Significant other contact made: No, patient has declined for CSW to make collateral contact. Patient understands diagnosis: Yes Discussing patient identified problems/goals with staff: Yes Medical problems stabilized or resolved: Yes Denies suicidal/homicidal ideation: Yes Issues/concerns per patient self-inventory: Yes Other:  New problem(s) identified: N/A  Discharge Plan or Barriers:   5/31: Patient plans to discharge home to follow up with outpatient services at [facility].   Reason for Continuation of Hospitalization:  Depression Anxiety Medication Stabilization   Comments: N/A  Estimated length of stay: 1-2 days  Patient is a 49 year old African American Male admitted for depression and SI. Has multiple previous admissions to [facility]. Patient lives in [place] alone. Patient will benefit from crisis stabilization, medication evaluation, group therapy, and psycho education in addition to case management for discharge planning. Patient and CSW reviewed pt's identified goals and treatment plan. Pt verbalized understanding and agreed to treatment plan.   Attendees: Patient:    Family:    Physician: Dr. Parke Poisson; Dr. Sabra Heck; Dr. Shea Evans 02/16/2015 9:30 AM  Nursing: Gerald Leitz, Grayland Ormond, [place], RN 02/16/2015 9:30 AM  Clinical Social Worker: Tilden Fossa, Peri Maris  LCSWA 02/16/2015 9:30 AM  Other: Joette Catching, LCSW 02/16/2015 9:30 AM  Other: Lucinda Dell, Beverly Sessions Liaison 02/16/2015 9:30 AM  Other: Lars Pinks, Case Manager 02/16/2015 9:30 AM  Other: Ave Filter, NP 02/16/2015 9:30 AM  Other:    Other:    Other:    Other:      Scribe for Treatment Team:  Tilden Fossa, MSW, [employer]  [phone]

## 2015-02-16 NOTE — Progress Notes (Signed)
[facility] MD Progress Note  02/16/2015 11:44 AM Gabriel Ashley  MRN:  [id number] Subjective:  Gabriel Ashley states he is still trying to get his life back together. He is planning to get back with the [place] as soon as possible. He states that being back on his medications is starting to help his mood. He is going to be pursuing the anger management groups as court ordered. States that even if not court ordered he knows he needs the help. He is going to try to get his furniture that is in storage back in his place as he has been living in an empty apartment since his GF left and took everything. States that being in that empty apartment contributed to his depression Principal Problem: MDD (major depressive disorder), recurrent severe, without psychosis Diagnosis:   Patient Active Problem List   Diagnosis Date Noted  . Substance abuse [F19.10] 10/26/2014    Priority: High  . Substance induced mood disorder [F19.94]     Priority: High  . PTSD (post-traumatic stress disorder) [F43.10]     Priority: High  . Essential hypertension, benign [I10] 05/11/2014    Priority: High  . Suicidal ideation [R45.851] 05/07/2014    Priority: High  . Severe recurrent major depression without psychotic features [F33.2] 05/07/2014    Priority: High  . MDD (major depressive disorder), recurrent severe, without psychosis [F33.2] 02/13/2015  . Essential hypertension [I10]   . Major depressive disorder, recurrent, severe without psychotic features [F33.2]   . Severe major depression [F32.2] 10/23/2014  . Elevated serum creatinine [R74.8] 05/08/2014  . Depressive disorder [F32.9] 05/07/2014  . Homicidal ideation [R45.850] 05/07/2014  . Anxiety [F41.9] 05/07/2014   Total Time spent with patient: 30 minutes   Past Medical History:  Past Medical History  Diagnosis Date  . Hypertension   . Anxiety   . Asthma   . Post traumatic stress disorder (PTSD)    History reviewed. No pertinent past surgical history. Family History: History  reviewed. No pertinent family history. Social History:  History  Alcohol Use  . Yes    Comment: 2 40 oz beers last night     History  Drug Use  . Yes  . Special: Cocaine, Marijuana    History   Social History  . Marital Status: Single    Spouse Name: N/A  . Number of Children: N/A  . Years of Education: N/A   Social History Main Topics  . Smoking status: Current Every Day Smoker -- 1.00 packs/day for 31 years  . Smokeless tobacco: Never Used  . Alcohol Use: Yes     Comment: 2 40 oz beers last night  . Drug Use: Yes    Special: Cocaine, Marijuana  . Sexual Activity: Not on file   Other Topics Concern  . None   Social History Narrative   Additional History:    Sleep: Fair  Appetite:  Fair   Assessment:   Musculoskeletal: Strength & Muscle Tone: within normal limits Gait & Station: normal Patient leans: normal   Psychiatric Specialty Exam: Physical Exam  Review of Systems  Constitutional: Negative.   HENT: Negative.   Eyes: Negative.   Respiratory: Negative.   Cardiovascular: Negative.   Gastrointestinal: Negative.   Genitourinary: Negative.   Musculoskeletal: Negative.   Skin: Negative.   Neurological: Negative.   Endo/Heme/Allergies: Negative.   Psychiatric/Behavioral: Positive for depression. The patient is nervous/anxious.     Blood pressure 145/98, pulse 81, temperature 98.4 F (36.9 C), temperature source Oral, resp. rate 18,  height 5' 10"  (1.778 m), weight 84.369 kg (186 lb), SpO2 100 %.Body mass index is 26.69 kg/(m^2).  General Appearance: Fairly Groomed  Engineer, water::  Fair  Speech:  Clear and Coherent  Volume:  Decreased  Mood:  Anxious and Depressed  Affect:  Tearful  Thought Process:  Coherent and Goal Directed  Orientation:  Full (Time, Place, and Person)  Thought Content:  symptoms events worries concerns  Suicidal Thoughts:  No  Homicidal Thoughts:  No  Memory:  Immediate;   Fair Recent;   Fair Remote;   Fair  Judgement:   Fair  Insight:  Present and Shallow  Psychomotor Activity:  Decreased  Concentration:  Fair  Recall:  [employer] of Knowledge:Fair  Language: Fair  Akathisia:  No  Handed:  Right  AIMS (if indicated):     Assets:  Desire for Improvement Housing Social Support  ADL's:  Intact  Cognition: WNL  Sleep:  Number of Hours: 5     Current Medications: Current Facility-Administered Medications  Medication Dose Route Frequency Provider Last Rate Last Dose  . acetaminophen (TYLENOL) tablet 650 mg  650 mg Oral Q6H PRN Harriet Butte, NP   650 mg at 02/16/15 1103  . albuterol (PROVENTIL HFA;VENTOLIN HFA) 108 (90 BASE) MCG/ACT inhaler 1-2 puff  1-2 puff Inhalation Q4H PRN Harriet Butte, NP      . alum & mag hydroxide-simeth (MAALOX/MYLANTA) 200-200-20 MG/5ML suspension 30 mL  30 mL Oral Q4H PRN Harriet Butte, NP      . amLODipine (NORVASC) tablet 10 mg  10 mg Oral Daily Harriet Butte, NP   10 mg at 02/16/15 0825  . buPROPion (WELLBUTRIN XL) 24 hr tablet 300 mg  300 mg Oral Daily Harriet Butte, NP   300 mg at 02/16/15 0825  . busPIRone (BUSPAR) tablet 5 mg  5 mg Oral TID Nicholaus Bloom, MD   5 mg at 02/16/15 0825  . cloNIDine (CATAPRES) tablet 0.1 mg  0.1 mg Oral BID Harriet Butte, NP   0.1 mg at 02/16/15 0825  . diclofenac sodium (VOLTAREN) 1 % transdermal gel 2 g  2 g Topical QID PRN Laverle Hobby, PA-C   2 g at 02/16/15 1103  . magnesium hydroxide (MILK OF MAGNESIA) suspension 30 mL  30 mL Oral Daily PRN Harriet Butte, NP      . QUEtiapine (SEROQUEL) tablet 100 mg  100 mg Oral QHS Harriet Butte, NP   100 mg at 02/15/15 2258  . traZODone (DESYREL) tablet 50 mg  50 mg Oral QHS PRN Harriet Butte, NP   50 mg at 02/13/15 2230    Lab Results:  Results for orders placed or performed during the hospital encounter of 02/13/15 (from the past 48 hour(s))  Comprehensive metabolic panel     Status: Abnormal   Collection Time: 02/15/15  6:20 AM  Result Value Ref Range   Sodium 139 135  - 145 mmol/L   Potassium 3.8 3.5 - 5.1 mmol/L   Chloride 108 101 - 111 mmol/L   CO2 24 22 - 32 mmol/L   Glucose, Bld 90 65 - 99 mg/dL   BUN 24 (H) 6 - 20 mg/dL   Creatinine, Ser 1.87 (H) 0.61 - 1.24 mg/dL   Calcium 8.6 (L) 8.9 - 10.3 mg/dL   Total Protein 6.7 6.5 - 8.1 g/dL   Albumin 3.7 3.5 - 5.0 g/dL   AST 24 15 - 41 U/L   ALT  25 17 - 63 U/L   Alkaline Phosphatase 60 38 - 126 U/L   Total Bilirubin 0.4 0.3 - 1.2 mg/dL   GFR calc non Af Amer 40 (L) >60 mL/min   GFR calc Af Amer 47 (L) >60 mL/min    Comment: (NOTE) The eGFR has been calculated using the CKD EPI equation. This calculation has not been validated in all clinical situations. eGFR's persistently <60 mL/min signify possible Chronic Kidney Disease.    Anion gap 7 5 - 15    Comment: Performed at [facility]    Physical Findings: AIMS: Facial and Oral Movements Muscles of Facial Expression: None, normal Lips and Perioral Area: None, normal Jaw: None, normal Tongue: None, normal,Extremity Movements Upper (arms, wrists, hands, fingers): None, normal Lower (legs, knees, ankles, toes): None, normal, Trunk Movements Neck, shoulders, hips: None, normal, Overall Severity Severity of abnormal movements (highest score from questions above): None, normal Incapacitation due to abnormal movements: None, normal Patient's awareness of abnormal movements (rate only patient's report): No Awareness, Dental Status Current problems with teeth and/or dentures?: No Does patient usually wear dentures?: No  CIWA:  CIWA-Ar Total: 0 COWS:     Treatment Plan Summary: Daily contact with patient to assess and evaluate symptoms and progress in treatment and Medication management Supportive approach/coping skills Depression; continue the Wellbutrin 300 mg in AM Anxiety; continue to take the Buspar and increase to 10 mg TID PTSD; will respect the fact he does not want to address it ( he continues to exhibit a change in  affect when questions are asked) will encourage to address it with the [place] Will go to a session this afternoon with the person who does Anger Management for the [facility]  Medical Decision Making:  Review of Psycho-Social Stressors (1) and Review of Medication Regimen & Side Effects (2)     Dominick Zertuche A 02/16/2015, 11:44 AM

## 2015-02-17 MED ORDER — AMLODIPINE BESYLATE 10 MG PO TABS: 10.0000 mg | ORAL_TABLET | Freq: Every day | ORAL | Status: DC

## 2015-02-17 MED ORDER — BUPROPION HCL ER (XL) 300 MG PO TB24: 300.0000 mg | ORAL_TABLET | Freq: Every day | ORAL | Status: DC

## 2015-02-17 MED ORDER — QUETIAPINE FUMARATE 100 MG PO TABS: 100.0000 mg | ORAL_TABLET | Freq: Every day | ORAL | Status: DC

## 2015-02-17 MED ORDER — DICLOFENAC SODIUM 1 % TD GEL: 2.0000 g | Freq: Four times a day (QID) | TRANSDERMAL | Status: DC | PRN

## 2015-02-17 MED ORDER — BUSPIRONE HCL 10 MG PO TABS: 10.0000 mg | ORAL_TABLET | Freq: Three times a day (TID) | ORAL | Status: DC

## 2015-02-17 MED ORDER — TRAZODONE HCL 50 MG PO TABS: 50.0000 mg | ORAL_TABLET | Freq: Every evening | ORAL | Status: DC | PRN

## 2015-02-17 MED ORDER — CLONIDINE HCL 0.1 MG PO TABS: 0.1000 mg | ORAL_TABLET | Freq: Two times a day (BID) | ORAL | Status: DC

## 2015-02-17 NOTE — BHH Suicide Risk Assessment (Signed)
[facility] Discharge Suicide Risk Assessment   Demographic Factors:  Male  Total Time spent with patient: 30 minutes  Musculoskeletal: Strength & Muscle Tone: within normal limits Gait & Station: normal Patient leans: N/A  Psychiatric Specialty Exam: Physical Exam  Review of Systems  Constitutional: Negative.   HENT: Negative.   Eyes: Negative.   Respiratory: Negative.   Cardiovascular: Negative.   Gastrointestinal: Negative.   Genitourinary: Negative.   Musculoskeletal: Negative.   Skin: Negative.   Neurological: Negative.   Endo/Heme/Allergies: Negative.   Psychiatric/Behavioral: The patient is nervous/anxious.     Blood pressure 132/99, pulse 85, temperature 97.5 F (36.4 C), temperature source Oral, resp. rate 18, height 5\' 10"  (1.778 m), weight 84.369 kg (186 lb), SpO2 100 %.Body mass index is 26.69 kg/(m^2).  General Appearance: Fairly Groomed  Engineer, water::  Fair  Speech:  Clear and [id number]  Volume:  Normal  Mood:  Anxious  Affect:  Appropriate  Thought Process:  Coherent and Goal Directed  Orientation:  Full (Time, Place, and Person)  Thought Content:  plans as he moves on  Suicidal Thoughts:  No  Homicidal Thoughts:  No  Memory:  Immediate;   Fair Recent;   Fair Remote;   Fair  Judgement:  Fair  Insight:  Present  Psychomotor Activity:  Normal  Concentration:  Fair  Recall:  [employer] of Knowledge:Fair  Language: Fair  Akathisia:  No  Handed:  Right  AIMS (if indicated):     Assets:  Desire for Improvement  Sleep:  Number of Hours: 6  Cognition: WNL  ADL's:  Intact   Have you used any form of tobacco in the last 30 days? (Cigarettes, Smokeless Tobacco, Cigars, and/or Pipes): Yes  Has this patient used any form of tobacco in the last 30 days? (Cigarettes, Smokeless Tobacco, Cigars, and/or Pipes) Yes, Prescription not provided because: will use nicotine patches  Mental Status Per Nursing Assessment::   On Admission:  Suicidal ideation indicated by  patient, Suicide plan, Plan includes specific time, place, or method  Current Mental Status by Physician: IN full contact with reality. There are no active SI plans or intent. He is willing and motivated to pursue outpatient treatment at the [place]. He will continue to abstain from alcohol and drugs, will go to the anger management group, will re join the [place]. States he is committed to turn his life around   Loss Factors: Decline in physical health  Historical Factors: NA  Risk Reduction Factors:   Positive social support  Continued Clinical Symptoms:  Depression:   Severe  Cognitive Features That Contribute To Risk:  None    Suicide Risk:  Minimal: No identifiable suicidal ideation.  Patients presenting with no risk factors but with morbid ruminations; may be classified as minimal risk based on the severity of the depressive symptoms  Principal Problem: MDD (major depressive disorder), recurrent severe, without psychosis Discharge Diagnoses:  Patient Active Problem List   Diagnosis Date Noted  . Substance abuse [F19.10] 10/26/2014    Priority: High  . Substance induced mood disorder [F19.94]     Priority: High  . PTSD (post-traumatic stress disorder) [F43.10]     Priority: High  . Essential hypertension, benign [I10] 05/11/2014    Priority: High  . Suicidal ideation [R45.851] 05/07/2014    Priority: High  . Severe recurrent major depression without psychotic features [F33.2] 05/07/2014    Priority: High  . MDD (major depressive disorder), recurrent severe, without psychosis [F33.2] 02/13/2015  . Essential  hypertension [I10]   . Major depressive disorder, recurrent, severe without psychotic features [F33.2]   . Severe major depression [F32.2] 10/23/2014  . Elevated serum creatinine [R74.8] 05/08/2014  . Depressive disorder [F32.9] 05/07/2014  . Homicidal ideation [R45.850] 05/07/2014  . Anxiety [F41.9] 05/07/2014    Follow-up Information    Follow up with [place] On 02/23/2015.   Why:  Appointment with Dr. Nelva Bush on Tuesday June 7th at 11 am. Please request to be set up with a therapist at appointment. Please call office if you need to reschedule.   Contact information:   [place], [place]. [place] Phone: [phone] Fax: [phone]      Plan Of Care/Follow-up recommendations:  Activity:  as tolerated Diet:  regular Follow up [facility] Is patient on multiple antipsychotic therapies at discharge:  No   Has Patient had three or more failed trials of antipsychotic monotherapy by history:  No  Recommended Plan for Multiple Antipsychotic Therapies: NA    Lunell Robart A 02/17/2015, 12:01 PM

## 2015-02-17 NOTE — Progress Notes (Signed)
  [facility] Adult Case Management Discharge Plan :  Will you be returning to the same living situation after discharge:  Yes,  patient plans to return home At discharge, do you have transportation home?: Yes,  patient will be provided with bus passes Do you have the ability to pay for your medications: Yes,  patient will be provided with medication samples and prescriptions at discharge  Release of information consent forms completed and in the chart;  Patient's signature needed at discharge.  Patient to Follow up at: Follow-up Information    Follow up with [place] On 02/23/2015.   Why:  Appointment with Dr. Nelva Bush on Tuesday June 7th at 11 am. Please request to be set up with a therapist at appointment. Please call office if you need to reschedule.   Contact information:   [place], [place] Phone: [phone] Fax: [phone]      Patient denies SI/HI: Yes,  denies    Safety Planning and Suicide Prevention discussed: Yes,  with patient  Have you used any form of tobacco in the last 30 days? (Cigarettes, Smokeless Tobacco, Cigars, and/or Pipes): Yes  Has patient been referred to the Quitline?: Patient refused referral  Franceska Strahm, Casimiro Needle 02/17/2015, 9:34 AM

## 2015-02-17 NOTE — Progress Notes (Signed)
Recreation Therapy Notes  Date: 06.01.16 Time: 9:30am Location: [address] Dayroom  Group Topic: Stress Management  Goal Area(s) Addresses:  Patient will verbalize importance of using healthy stress management.  Patient will identify positive emotions associated with healthy stress management.    Intervention: [employer] Script  Activity : Guided Automotive engineer. LRT introduced and educated patients on stress management technique of guided imagery.  A script was used to deliver the technique to patients.  Patients were asked to follow script read by LRT to engage in practicing stress management technique.   Education:  Stress Management, Discharge Planning.   Education Outcome: Acknowledges edcuation/In group clarification offered/Needs additional education  Clinical Observations/Feedback: Patient did not attend group.  Victorino Sparrow, LRT/CTRS         Victorino Sparrow A 02/17/2015 3:31 PM

## 2015-02-17 NOTE — BHH Group Notes (Signed)
   [facility] LCSW Aftercare Discharge Planning Group Note  02/17/2015  8:45 AM   Participation Quality: Alert, Appropriate and Oriented  Mood/Affect: Appropriate  Depression Rating: 1  Anxiety Rating: 2-3  Thoughts of Suicide: Pt denies SI/HI  Will you contract for safety? Yes  Current AVH: Pt denies  Plan for Discharge/Comments: Pt attended discharge planning group and actively participated in group. CSW provided pt with today's workbook. Patient reports feeling "okay" today and is hoping to discharge early this afternoon. He plans to follow up with the [facility] in [place] and was provided with contact information for veteran transportation assistance. Patient requesting letter for court and 2 bus passes at discharge. He plans to return home.  Transportation Means: Pt reports access to transportation- bus pass  Supports: No supports mentioned at this time  Tilden Fossa, MSW, [place] Social Worker [employer] [phone]

## 2015-02-17 NOTE — Progress Notes (Addendum)
Pt appears excited about leaving today and at the same time somewhat nervous. He asked to charge his cell phone prior to leaving and inquired if he could have some samples of medications. Pt is very pleasant. He denies any Si or  HI and contracts for safety. Pt inquired about taking his voltaren gel for intermittent shoulder pain. Per pharmacy that was okay. Per Dr Sabra Heck -pt will be discharged today and will follow up with the [facility] hospital. Pt was given dietary instructions on trying to stay on a 2gram sodium diet and to avoid foods heavy in salt. Pt was instructed  to try to eat baked foods rather than  Fried foods and to hydrate with water. 1:20p-pt was discharged and given back all his belongings. He was given two bus passes and a 2 week supply of sample medications. Pt stated he was headed to his house on [address]. He stated he was not SI or HI and if he ever felt that way he would contact 911.

## 2015-02-18 NOTE — Discharge Summary (Signed)
Physician Discharge Summary Note  Patient:  Gabriel Ashley is an 49 y.o., male MRN:  [id number] DOB:  [birth year] Patient phone:  [phone] (home)  Patient address:   [address] [place] [address],  Total Time spent with patient: 30 minutes  Date of Admission:  02/13/2015 Date of Discharge: 02/17/2015  Reason for Admission:  Suicidal ideation  Principal Problem: MDD (major depressive disorder), recurrent severe, without psychosis Discharge Diagnoses: Patient Active Problem List   Diagnosis Date Noted  . MDD (major depressive disorder), recurrent severe, without psychosis [F33.2] 02/13/2015  . Essential hypertension [I10]   . Substance abuse [F19.10] 10/26/2014  . Substance induced mood disorder [F19.94]   . Major depressive disorder, recurrent, severe without psychotic features [F33.2]   . Severe major depression [F32.2] 10/23/2014  . PTSD (post-traumatic stress disorder) [F43.10]   . Essential hypertension, benign [I10] 05/11/2014  . Elevated serum creatinine [R74.8] 05/08/2014  . Depressive disorder [F32.9] 05/07/2014  . Suicidal ideation [R45.851] 05/07/2014  . Homicidal ideation [R45.850] 05/07/2014  . Anxiety [F41.9] 05/07/2014  . Severe recurrent major depression without psychotic features [F33.2] 05/07/2014    Musculoskeletal: Strength & Muscle Tone: within normal limits Gait & Station: normal Patient leans: N/A  Psychiatric Specialty Exam:  SEE SRA Physical Exam  Vitals reviewed.   ROS  Blood pressure 143/97, pulse 67, temperature 97.5 F (36.4 C), temperature source Oral, resp. rate 18, height 5\' 10"  (1.778 m), weight 84.369 kg (186 lb), SpO2 100 %.Body mass index is 26.69 kg/(m^2).  Have you used any form of tobacco in the last 30 days? (Cigarettes, Smokeless Tobacco, Cigars, and/or Pipes): Yes  Has this patient used any form of tobacco in the last 30 days? (Cigarettes, Smokeless Tobacco, Cigars, and/or Pipes) N/A  Past Medical History:  Past Medical  History  Diagnosis Date  . Hypertension   . Anxiety   . Asthma   . Post traumatic stress disorder (PTSD)    History reviewed. No pertinent past surgical history. Family History: History reviewed. No pertinent family history. Social History:  History  Alcohol Use  . Yes    Comment: 2 40 oz beers last night     History  Drug Use  . Yes  . Special: Cocaine, Marijuana    History   Social History  . Marital Status: Single    Spouse Name: N/A  . Number of Children: N/A  . Years of Education: N/A   Social History Main Topics  . Smoking status: Current Every Day Smoker -- 1.00 packs/day for 31 years  . Smokeless tobacco: Never Used  . Alcohol Use: Yes     Comment: 2 40 oz beers last night  . Drug Use: Yes    Special: Cocaine, Marijuana  . Sexual Activity: Not on file   Other Topics Concern  . None   Social History Narrative   Risk to Self: Is patient at risk for suicide?: Yes What has been your use of drugs/alcohol within the last 12 months?: Alcohol and THC until one month ago for sleep Risk to Others:   Prior Inpatient Therapy:   Prior Outpatient Therapy:    Level of Care:  OP  Hospital Course: Gabriel Ashley, 49 Y/O male who stated that he started feeling suicidal by running into traffic.  He lived in an apartment by himself and he got more depressed. Stated he has anger management issues.  Pt stated recent stressors are a romantic breakup 10 month ago that resulted in domestic violence charges for the  pt for assaulting his GF.  She moved out and took all the furniture. He states he has been living in an empty apartment for the last 2 moths, getting increasingly more depressed, isolated.  He stated he stopped drinking and using any sort of drugs or smoking 2 months ago which made problem worse.   Gabriel Ashley was admitted for MDD (major depressive disorder), recurrent severe, without psychosis and crisis management.  He was treated discharged with the medications listed  below under Medication List.  Medical problems were identified and treated as needed.  Home medications were restarted as appropriate.  Improvement was monitored by observation and Gabriel Ashley daily report of symptom reduction.  Emotional and mental status was monitored by daily self-inventory reports completed by Gabriel Ashley and clinical staff.         Gabriel Ashley was evaluated by the treatment team for stability and plans for continued recovery upon discharge.  Gabriel Ashley motivation was an integral factor for scheduling further treatment.  Employment, transportation, bed availability, health status, family support, and any pending legal issues were also considered during his hospital stay.  He was offered further treatment options upon discharge including but not limited to Residential, Intensive Outpatient, and Outpatient treatment.  Gabriel Ashley will follow up with the services as listed below under Follow Up Information.     Upon completion of this admission the patient was both mentally and medically stable for discharge denying suicidal/homicidal ideation, auditory/visual/tactile hallucinations, delusional thoughts and paranoia.      Consults:  psychiatry  Significant Diagnostic Studies:  BUN 24.  Creat 1.87.  Calcium 8.6  Discharge Vitals:   Blood pressure 143/97, pulse 67, temperature 97.5 F (36.4 C), temperature source Oral, resp. rate 18, height 5\' 10"  (1.778 m), weight 84.369 kg (186 lb), SpO2 100 %. Body mass index is 26.69 kg/(m^2). Lab Results:   No results found for this or any previous visit (from the past 72 hour(s)).  Physical Findings: AIMS: Facial and Oral Movements Muscles of Facial Expression: None, normal Lips and Perioral Area: None, normal Jaw: None, normal Tongue: None, normal,Extremity Movements Upper (arms, wrists, hands, fingers): None, normal Lower (legs, knees, ankles, toes): None, normal, Trunk Movements Neck, shoulders, hips: None, normal, Overall  Severity Severity of abnormal movements (highest score from questions above): None, normal Incapacitation due to abnormal movements: None, normal Patient's awareness of abnormal movements (rate only patient's report): No Awareness, Dental Status Current problems with teeth and/or dentures?: No Does patient usually wear dentures?: No  CIWA:  CIWA-Ar Total: 0 COWS:      See Psychiatric Specialty Exam and Suicide Risk Assessment completed by Attending Physician prior to discharge.  Discharge destination:  Home  Is patient on multiple antipsychotic therapies at discharge:  No   Has Patient had three or more failed trials of antipsychotic monotherapy by history:  No  Recommended Plan for Multiple Antipsychotic Therapies: NA     Medication List    STOP taking these medications        ATENOLOL PO     hydrALAZINE 25 MG tablet  Commonly known as:  APRESOLINE     labetalol 200 MG tablet  Commonly known as:  NORMODYNE     triamterene-hydrochlorothiazide 37.5-25 MG per tablet  Commonly known as:  MAXZIDE-25      TAKE these medications      Indication   albuterol 108 (90 BASE) MCG/ACT inhaler  Commonly known as:  PROVENTIL HFA;VENTOLIN HFA  Inhale 1-2 puffs into the  lungs every 4 (four) hours as needed for wheezing or shortness of breath.   Indication:  Asthma     amLODipine 10 MG tablet  Commonly known as:  NORVASC  Take 1 tablet (10 mg total) by mouth daily.   Indication:  High Blood Pressure     buPROPion 300 MG 24 hr tablet  Commonly known as:  WELLBUTRIN XL  Take 1 tablet (300 mg total) by mouth daily.   Indication:  Major Depressive Disorder     busPIRone 10 MG tablet  Commonly known as:  BUSPAR  Take 1 tablet (10 mg total) by mouth 3 (three) times daily.   Indication:  Anxiety Disorder     cloNIDine 0.1 MG tablet  Commonly known as:  CATAPRES  Take 1 tablet (0.1 mg total) by mouth 2 (two) times daily.   Indication:  High Blood Pressure     diclofenac sodium  1 % Gel  Commonly known as:  VOLTAREN  Apply 2 g topically 4 (four) times daily as needed (pain).   Indication:  Joint Damage causing Pain and Loss of Function     QUEtiapine 100 MG tablet  Commonly known as:  SEROQUEL  Take 1 tablet (100 mg total) by mouth at bedtime.   Indication:  Trouble Sleeping, mood control     traZODone 50 MG tablet  Commonly known as:  DESYREL  Take 1 tablet (50 mg total) by mouth at bedtime as needed for sleep.   Indication:  Trouble Sleeping           Follow-up Information    Follow up with [facility] On 02/23/2015.   Why:  Appointment with Dr. Nelva Bush on Tuesday June 7th at 11 am. Please request to be set up with a therapist at appointment. Please call office if you need to reschedule.   Contact information:   [place], [place] Phone: [phone] Fax: [phone]      Follow-up recommendations:  Activity:  as tol, diet as tol  Comments:  1.  Take all your medications as prescribed.              2.  Report any adverse side effects to outpatient provider.                       3.  Patient instructed to not use alcohol or illegal drugs while on prescription medicines.            4.  In the event of worsening symptoms, instructed patient to call 911, the crisis hotline or go to nearest emergency room for evaluation of symptoms.  Total Discharge Time: 40 min  Signed: Freda Munro May [name] AGNP-BC 02/18/2015, 11:02 AM  I personally assessed the patient and formulated the plan Geralyn Flash A. Sabra Heck, M.D.

## 2016-12-04 ENCOUNTER — Emergency Department (HOSPITAL_COMMUNITY): Payer: Self-pay

## 2016-12-04 ENCOUNTER — Inpatient Hospital Stay (HOSPITAL_COMMUNITY)
Admission: EM | Admit: 2016-12-04 | Discharge: 2016-12-07 | DRG: 291 | Disposition: A | Discharge status: Home / Self Care | Payer: Self-pay | Attending: Cardiovascular Disease | Admitting: Cardiovascular Disease

## 2016-12-04 ENCOUNTER — Encounter (HOSPITAL_COMMUNITY): Payer: Self-pay

## 2016-12-04 DIAGNOSIS — F419 Anxiety disorder, unspecified: Secondary | ICD-10-CM | POA: Diagnosis present

## 2016-12-04 DIAGNOSIS — I5031 Acute diastolic (congestive) heart failure: Secondary | ICD-10-CM | POA: Diagnosis present

## 2016-12-04 DIAGNOSIS — Z9114 Patient's other noncompliance with medication regimen: Secondary | ICD-10-CM

## 2016-12-04 DIAGNOSIS — R52 Pain, unspecified: Secondary | ICD-10-CM

## 2016-12-04 DIAGNOSIS — F339 Major depressive disorder, recurrent, unspecified: Secondary | ICD-10-CM | POA: Diagnosis present

## 2016-12-04 DIAGNOSIS — I161 Hypertensive emergency: Secondary | ICD-10-CM | POA: Diagnosis present

## 2016-12-04 DIAGNOSIS — I5041 Acute combined systolic (congestive) and diastolic (congestive) heart failure: Secondary | ICD-10-CM | POA: Diagnosis present

## 2016-12-04 DIAGNOSIS — F4310 Post-traumatic stress disorder, unspecified: Secondary | ICD-10-CM | POA: Diagnosis present

## 2016-12-04 DIAGNOSIS — N2581 Secondary hyperparathyroidism of renal origin: Secondary | ICD-10-CM | POA: Diagnosis present

## 2016-12-04 DIAGNOSIS — F17200 Nicotine dependence, unspecified, uncomplicated: Secondary | ICD-10-CM | POA: Diagnosis present

## 2016-12-04 DIAGNOSIS — Z9111 Patient's noncompliance with dietary regimen: Secondary | ICD-10-CM

## 2016-12-04 DIAGNOSIS — E790 Hyperuricemia without signs of inflammatory arthritis and tophaceous disease: Secondary | ICD-10-CM | POA: Diagnosis present

## 2016-12-04 DIAGNOSIS — I429 Cardiomyopathy, unspecified: Secondary | ICD-10-CM | POA: Diagnosis present

## 2016-12-04 DIAGNOSIS — N400 Enlarged prostate without lower urinary tract symptoms: Secondary | ICD-10-CM | POA: Diagnosis present

## 2016-12-04 DIAGNOSIS — D638 Anemia in other chronic diseases classified elsewhere: Secondary | ICD-10-CM | POA: Diagnosis present

## 2016-12-04 DIAGNOSIS — I130 Hypertensive heart and chronic kidney disease with heart failure and stage 1 through stage 4 chronic kidney disease, or unspecified chronic kidney disease: Principal | ICD-10-CM | POA: Diagnosis present

## 2016-12-04 DIAGNOSIS — I248 Other forms of acute ischemic heart disease: Secondary | ICD-10-CM | POA: Diagnosis present

## 2016-12-04 DIAGNOSIS — J45909 Unspecified asthma, uncomplicated: Secondary | ICD-10-CM | POA: Diagnosis present

## 2016-12-04 DIAGNOSIS — K5900 Constipation, unspecified: Secondary | ICD-10-CM | POA: Diagnosis present

## 2016-12-04 DIAGNOSIS — F1490 Cocaine use, unspecified, uncomplicated: Secondary | ICD-10-CM | POA: Diagnosis present

## 2016-12-04 DIAGNOSIS — Z79899 Other long term (current) drug therapy: Secondary | ICD-10-CM

## 2016-12-04 DIAGNOSIS — F1290 Cannabis use, unspecified, uncomplicated: Secondary | ICD-10-CM | POA: Diagnosis present

## 2016-12-04 DIAGNOSIS — R072 Precordial pain: Secondary | ICD-10-CM

## 2016-12-04 DIAGNOSIS — N184 Chronic kidney disease, stage 4 (severe): Secondary | ICD-10-CM

## 2016-12-04 LAB — COMPREHENSIVE METABOLIC PANEL
ALT: 161 U/L — ABNORMAL HIGH (ref 17–63)
ANION GAP: 12 (ref 5–15)
AST: 159 U/L — ABNORMAL HIGH (ref 15–41)
Albumin: 3.4 g/dL — ABNORMAL LOW (ref 3.5–5.0)
Alkaline Phosphatase: 107 U/L (ref 38–126)
BUN: 38 mg/dL — ABNORMAL HIGH (ref 6–20)
CALCIUM: 8.2 mg/dL — AB (ref 8.9–10.3)
CHLORIDE: 106 mmol/L (ref 101–111)
CO2: 21 mmol/L — ABNORMAL LOW (ref 22–32)
Creatinine, Ser: 3.82 mg/dL — ABNORMAL HIGH (ref 0.61–1.24)
GFR calc Af Amer: 19 mL/min — ABNORMAL LOW (ref >60)
GFR calc non Af Amer: 17 mL/min — ABNORMAL LOW (ref >60)
Glucose, Bld: 96 mg/dL (ref 65–99)
Potassium: 4.2 mmol/L (ref 3.5–5.1)
SODIUM: 139 mmol/L (ref 135–145)
Total Bilirubin: 0.5 mg/dL (ref 0.3–1.2)
Total Protein: 6.7 g/dL (ref 6.5–8.1)

## 2016-12-04 LAB — CBC
HCT: 33.4 % — ABNORMAL LOW (ref 39.0–52.0)
Hemoglobin: 11.1 g/dL — ABNORMAL LOW (ref 13.0–17.0)
MCH: 28.7 pg (ref 26.0–34.0)
MCHC: 33.2 g/dL (ref 30.0–36.0)
MCV: 86.3 fL (ref 78.0–100.0)
Platelets: 196 10*3/uL (ref 150–400)
RBC: 3.87 MIL/uL — AB (ref 4.22–5.81)
RDW: 14.9 % (ref 11.5–15.5)
WBC: 6.4 10*3/uL (ref 4.0–10.5)

## 2016-12-04 LAB — I-STAT TROPONIN, ED: TROPONIN I, POC: 0.12 ng/mL — AB (ref 0.00–0.08)

## 2016-12-04 LAB — TROPONIN I: TROPONIN I: 0.11 ng/mL — AB (ref <0.03)

## 2016-12-04 LAB — BRAIN NATRIURETIC PEPTIDE: B Natriuretic Peptide: 887.4 pg/mL — ABNORMAL HIGH (ref 0.0–100.0)

## 2016-12-04 MED ORDER — ONDANSETRON HCL 4 MG/2ML IJ SOLN: 4.0000 mg | Freq: Four times a day (QID) | INTRAMUSCULAR | Status: DC | PRN

## 2016-12-04 MED ORDER — SODIUM CHLORIDE 0.9% FLUSH
3.0000 mL | Freq: Two times a day (BID) | INTRAVENOUS | Status: DC
  Administered 2016-12-04 – 2016-12-07 (×7): 3 mL via INTRAVENOUS

## 2016-12-04 MED ORDER — HYDRALAZINE HCL 25 MG PO TABS
25.0000 mg | ORAL_TABLET | Freq: Four times a day (QID) | ORAL | Status: DC
  Administered 2016-12-05 – 2016-12-07 (×11): 25 mg via ORAL
  Filled 2016-12-04 (×11): qty 1

## 2016-12-04 MED ORDER — AMLODIPINE BESYLATE 5 MG PO TABS
5.0000 mg | ORAL_TABLET | Freq: Two times a day (BID) | ORAL | Status: DC
  Administered 2016-12-05 – 2016-12-07 (×5): 5 mg via ORAL
  Filled 2016-12-04 (×6): qty 1

## 2016-12-04 MED ORDER — SODIUM CHLORIDE 0.9% FLUSH: 3.0000 mL | INTRAVENOUS | Status: DC | PRN

## 2016-12-04 MED ORDER — AMLODIPINE BESYLATE 5 MG PO TABS
5.0000 mg | ORAL_TABLET | Freq: Every day | ORAL | Status: DC
Start: 2016-12-04 — End: 2016-12-04
  Administered 2016-12-04: 5 mg via ORAL

## 2016-12-04 MED ORDER — HEPARIN SODIUM (PORCINE) 5000 UNIT/ML IJ SOLN
5000.0000 [IU] | Freq: Three times a day (TID) | INTRAMUSCULAR | Status: DC
  Administered 2016-12-04 – 2016-12-07 (×9): 5000 [IU] via SUBCUTANEOUS
  Filled 2016-12-04 (×9): qty 1

## 2016-12-04 MED ORDER — IPRATROPIUM-ALBUTEROL 0.5-2.5 (3) MG/3ML IN SOLN
3.0000 mL | Freq: Once | RESPIRATORY_TRACT | Status: AC
  Administered 2016-12-04: 3 mL via RESPIRATORY_TRACT
  Filled 2016-12-04: qty 3

## 2016-12-04 MED ORDER — FUROSEMIDE 10 MG/ML IJ SOLN
40.0000 mg | Freq: Two times a day (BID) | INTRAMUSCULAR | Status: DC
  Administered 2016-12-04 – 2016-12-05 (×3): 40 mg via INTRAVENOUS
  Filled 2016-12-04 (×3): qty 4

## 2016-12-04 MED ORDER — SODIUM CHLORIDE 0.9 % IV SOLN: 250.0000 mL | INTRAVENOUS | Status: DC | PRN

## 2016-12-04 MED ORDER — LABETALOL HCL 5 MG/ML IV SOLN
20.0000 mg | Freq: Once | INTRAVENOUS | Status: AC
  Administered 2016-12-04: 20 mg via INTRAVENOUS
  Filled 2016-12-04: qty 4

## 2016-12-04 MED ORDER — ACETAMINOPHEN 325 MG PO TABS
650.0000 mg | ORAL_TABLET | ORAL | Status: DC | PRN
  Administered 2016-12-05: 650 mg via ORAL
  Filled 2016-12-04: qty 2

## 2016-12-04 NOTE — ED Notes (Signed)
Patient transported to X-ray 

## 2016-12-04 NOTE — ED Notes (Signed)
EDP at bedside  

## 2016-12-04 NOTE — ED Triage Notes (Signed)
Pt. Here for constipation, wheezing, and headache that started 3 weeks ago. Pt. States he has asthma, but no rescue inhaler at home. Pt. States his last bowel movement was two days ago. Pt. States he has been trying to take pain medication and laxatives at home. Pt. Every day smoker.

## 2016-12-04 NOTE — Progress Notes (Signed)
CCMD called, box 04 double verified.

## 2016-12-04 NOTE — ED Notes (Signed)
Pt. Ambulatory with steady gait to restroom independently.

## 2016-12-04 NOTE — Progress Notes (Signed)
Call to Dr Doylene Canard to notify him of the critical troponin 0.14.  Dr Doylene Canard verified/read back result. No orders received. Pt is asymptomatic at this time, sitting comfortably, eating.

## 2016-12-04 NOTE — H&P (Signed)
Referring Physician:  Westin Knotts is an 51 y.o. male.                       Chief Complaint: Shortness of breath  HPI: 51 year old male with 3 weeks history of shortness of breath and wheezing. He has known history of hypertension and dietary and medication non-compliance. 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  Past Medical History:  Diagnosis Date  . Anxiety   . Asthma   . Hypertension   . Post traumatic stress disorder (PTSD)       History reviewed. No pertinent surgical history.  History reviewed. No pertinent family history. Social History:  reports that he has been smoking.  He has a 31.00 pack-year smoking history. He has never used smokeless tobacco. He reports that he drinks alcohol. He reports that he uses drugs, including Cocaine and Marijuana.  Allergies: No Known Allergies   (Not in a hospital admission)  Results for orders placed or performed during the hospital encounter of 12/04/16 (from the past 48 hour(s))  Comprehensive metabolic panel     Status: Abnormal   Collection Time: 12/04/16  7:46 AM  Result Value Ref Range   Sodium 139 135 - 145 mmol/L   Potassium 4.2 3.5 - 5.1 mmol/L   Chloride 106 101 - 111 mmol/L   CO2 21 (L) 22 - 32 mmol/L   Glucose, Bld 96 65 - 99 mg/dL   BUN 38 (H) 6 - 20 mg/dL   Creatinine, Ser 3.82 (H) 0.61 - 1.24 mg/dL   Calcium 8.2 (L) 8.9 - 10.3 mg/dL   Total Protein 6.7 6.5 - 8.1 g/dL   Albumin 3.4 (L) 3.5 - 5.0 g/dL   AST 159 (H) 15 - 41 U/L   ALT 161 (H) 17 - 63 U/L   Alkaline Phosphatase 107 38 - 126 U/L   Total Bilirubin 0.5 0.3 - 1.2 mg/dL   GFR calc non Af Amer 17 (L) >60 mL/min   GFR calc Af Amer 19 (L) >60 mL/min    Comment: (NOTE) The eGFR has been calculated using the CKD EPI equation. This calculation has not been validated in all clinical situations. eGFR's persistently <60 mL/min signify possible  Chronic Kidney Disease.    Anion gap 12 5 - 15  CBC     Status: Abnormal   Collection Time: 12/04/16  7:46 AM  Result Value Ref Range   WBC 6.4 4.0 - 10.5 K/uL   RBC 3.87 (L) 4.22 - 5.81 MIL/uL   Hemoglobin 11.1 (L) 13.0 - 17.0 g/dL   HCT 33.4 (L) 39.0 - 52.0 %   MCV 86.3 78.0 - 100.0 fL   MCH 28.7 26.0 - 34.0 pg   MCHC 33.2 30.0 - 36.0 g/dL   RDW 14.9 11.5 - 15.5 %   Platelets 196 150 - 400 K/uL  Brain natriuretic peptide     Status: Abnormal   Collection Time: 12/04/16  9:49 AM  Result Value Ref Range   B Natriuretic Peptide 887.4 (H) 0.0 - 100.0 pg/mL  I-stat troponin, ED     Status: Abnormal   Collection Time: 12/04/16 10:03 AM  Result Value Ref Range   Troponin i, poc 0.12 (HH) 0.00 - 0.08 ng/mL   Comment NOTIFIED PHYSICIAN    Comment 3            Comment: Due  to the release kinetics of cTnI, a negative result within the first hours of the onset of symptoms does not rule out myocardial infarction with certainty. If myocardial infarction is still suspected, repeat the test at appropriate intervals.    Dg Chest 1 View  Result Date: 12/04/2016 CLINICAL DATA:  Questionable lesion right upper lobe. EXAM: CHEST 1 VIEW: APICAL LORDOTIC COMPARISON:  Frontal and lateral views of the chest obtained earlier in the day FINDINGS: On apical lordotic image, the area of questionable nodularity is no longer appreciable. On the current examination, there is no consolidation or apparent mass. Cardiomegaly with pulmonary venous hypertension is noted. No adenopathy. No bone lesions. IMPRESSION: No nodular lesion or consolidation in the area of concern in the right upper lobe on recent frontal chest radiograph. There is a degree of underlying pulmonary vascular congestion. Electronically Signed   By: Lowella Grip III M.D.   On: 12/04/2016 09:01   Dg Chest 2 View  Result Date: 12/04/2016 CLINICAL DATA:  Wheezing for 3 weeks EXAM: CHEST  2 VIEW COMPARISON:  October 22, 2014 FINDINGS: There  is a slightly irregular opacity in or overlying the right upper lobe which likely represents a monitor lead. Elsewhere, there is slight interstitial pulmonary edema. There is cardiomegaly with pulmonary venous hypertension. No adenopathy. No bone lesions. IMPRESSION: Evidence a degree of congestive heart failure. Irregular opacity right upper lobe may represent overlying monitor leads; repeat study with removal of apparent monitor lead to exclude a small mass lesion in the right upper lobe advised. No evident adenopathy. Electronically Signed   By: Lowella Grip III M.D.   On: 12/04/2016 08:13    Review Of Systems Constitutional: No fever, chills , weight loss or gain. Eyes: No vision change, Wears glasses. No discharge or pain.. Ears: No hearing loss, No tinnitus. Respiratory: No asthma, COPD, pneumonias. Positive shortness of breath. No hemoptysis. Cardiovascular: No chest pain, palpitation or leg edema. Gastrointestinal: No nausea, vomiting or diarrhea. Positive constipation. No GI bleed. No hepatitis. Genitourinary: No dysuria, hematuria or kidney stone. No incontinance. Neurological: No headache, stroke or seizures.  Psychiatry: No psych facility admission for anxiety, depression or suicide. No detox. Skin: No rash. Musculoskeletal: No joint pain or fibromyalgia. No neck pain or back pain. Lymphadenopathy: No lymphadenopathy Hematology: No anemia or easy bruising.   Blood pressure (!) 166/104, pulse 69, temperature 97.9 F (36.6 C), temperature source Oral, resp. rate 16, height 6' (1.829 m), weight 94.8 kg (209 lb), SpO2 97 %. Body mass index is 28.35 kg/m. General appearance: alert, cooperative, appears stated age and no distress Head: Normocephalic, atraumatic. Eyes: Brown eyes, Pink conjunctivae/corneas clear. PERRL, EOM's intact. Neck: no adenopathy, no carotid bruit, no JVD, supple, symmetrical, trachea midline and thyroid not enlarged. Resp: clear to auscultation  bilaterally. Cardio: regular rate and rhythm, S1, S2 normal, II/VI systolic murmur, no click, rub or gallop GI: soft, non-tender; bowel sounds normal; no masses,  no organomegaly Extremities: extremities normal, atraumatic, no cyanosis or edema Skin: Warm and dry. No rashes or lesions Neurologic: Alert and oriented X 3, normal strength and tone. Normal coordination and gait.  Assessment/Plan Acute CHF, probably diastolic heart failure Hypertension, uncontrolled CKD, IV  Admit/Medications/Echocardiogram Discussed diet and medication compliance.  Birdie Riddle, MD  12/04/2016, 11:32 AM

## 2016-12-04 NOTE — ED Provider Notes (Signed)
[facility] DEPT Provider Note   CSN: [id number] Arrival date & time: 12/04/16  0703     History   Chief Complaint Chief Complaint  Patient presents with  . Headache  . Wheezing  . Constipation    HPI Gabriel Ashley is a 51 y.o. male with PMHx of anxiety, asthma, HTN, PTSD presenting to ED with complaints of constipation, wheezing, and HA.  He complains of wheezing for 2-3 weeks. He admits to associated shortness of breath. He states in is intermittent, unchanged and worsened when he moves and lying down. He states sitting up makes it better. He states he has felt this way before with his asthma but states he hasn't felt like this "in years."  He states does not have an inhaler at home. He states he threw his inhaler away because he hasn't needed it "in years". He states he has not taken anything for his wheezing. He admits to his usual dry cough with smoking that has been unchanged.  He denies trouble swallowing, recent URI.  He states his HA started 3 weeks ago that is unchanged, intermittent, sharp, throbbing, back of his head, 7/10 pain. He reports taking ibuprofen with moderate relief. He denies visual changes, double vision, photophobia, changes in gait.  He also complains of constipation. He reports his last bowel movement was 2 days ago. He reports taking laxatives at home with no relief. He denies blood in stools, abdominal pain, urinary symptoms.  He admits to smoking. He denies hx of COPD. He denies fevers, chills, nausea, vomiting, chest pain, recent travel, hormone use, cardiac hx, hx of DVT, hx of PE.  He states he had a PCP but last saw his him 2 years ago. He states he does not take BP medication or any medications at all. He states he has not seen his PCP due to "procrastination". Pt denies alcohol or drug use.  He admits to being hospitalized for asthma. He denies previous intubations.   The history is provided by the patient. No language interpreter was used.    Past  Medical History:  Diagnosis Date  . Anxiety   . Asthma   . Hypertension   . Post traumatic stress disorder (PTSD)     Patient Active Problem List   Diagnosis Date Noted  . MDD (major depressive disorder), recurrent severe, without psychosis ([place]) 02/13/2015  . Essential hypertension   . Substance abuse 10/26/2014  . Substance induced mood disorder ([place])   . Major depressive disorder, recurrent, severe without psychotic features ([place])   . Severe major depression ([place]) 10/23/2014  . PTSD (post-traumatic stress disorder)   . Essential hypertension, benign 05/11/2014  . Elevated serum creatinine 05/08/2014  . Depressive disorder 05/07/2014  . Suicidal ideation 05/07/2014  . Homicidal ideation 05/07/2014  . Anxiety 05/07/2014  . Severe recurrent major depression without psychotic features ([place]) 05/07/2014    History reviewed. No pertinent surgical history.     Home Medications    Prior to Admission medications   Medication Sig Start Date End Date Taking? Authorizing Provider  albuterol (PROVENTIL HFA;VENTOLIN HFA) 108 (90 BASE) MCG/ACT inhaler Inhale 1-2 puffs into the lungs every 4 (four) hours as needed for wheezing or shortness of breath. 05/12/14   Encarnacion Slates, NP  amLODipine (NORVASC) 10 MG tablet Take 1 tablet (10 mg total) by mouth daily. 02/17/15   Nicholaus Bloom, MD  buPROPion (WELLBUTRIN XL) 300 MG 24 hr tablet Take 1 tablet (300 mg total) by mouth daily. 02/17/15  Nicholaus Bloom, MD  busPIRone (BUSPAR) 10 MG tablet Take 1 tablet (10 mg total) by mouth 3 (three) times daily. 02/17/15   Nicholaus Bloom, MD  cloNIDine (CATAPRES) 0.1 MG tablet Take 1 tablet (0.1 mg total) by mouth 2 (two) times daily. 02/17/15   Nicholaus Bloom, MD  diclofenac sodium (VOLTAREN) 1 % GEL Apply 2 g topically 4 (four) times daily as needed (pain). 02/17/15   Nicholaus Bloom, MD  QUEtiapine (SEROQUEL) 100 MG tablet Take 1 tablet (100 mg total) by mouth at bedtime. 02/17/15   Nicholaus Bloom, MD  traZODone  (DESYREL) 50 MG tablet Take 1 tablet (50 mg total) by mouth at bedtime as needed for sleep. 02/17/15   Nicholaus Bloom, MD    Family History History reviewed. No pertinent family history.  Social History Social History  Substance Use Topics  . Smoking status: Current Every Day Smoker    Packs/day: 1.00    Years: 31.00  . Smokeless tobacco: Never Used  . Alcohol use Yes     Comment: 2 40 oz beers last night     Allergies   Patient has no known allergies.   Review of Systems Review of Systems  Constitutional: Negative for chills and fever.  Eyes: Negative for photophobia and visual disturbance.  Respiratory: Positive for cough, shortness of breath and wheezing.   Cardiovascular: Negative for chest pain.  Gastrointestinal: Positive for constipation. Negative for abdominal pain, nausea and vomiting.  Genitourinary: Negative for difficulty urinating and dysuria.  Neurological: Negative for dizziness and numbness.  All other systems reviewed and are negative.    Physical Exam Updated Vital Signs BP (!) 166/104   Pulse 69   Temp 97.9 F (36.6 C) (Oral)   Resp 16   Ht 6' (1.829 m)   Wt 94.8 kg   SpO2 97%   BMI 28.35 kg/m   Physical Exam  Constitutional: He is oriented to person, place, and time. He appears well-developed and well-nourished.  Well appearing  HENT:  Head: Normocephalic and atraumatic.  Nose: Nose normal.  Mouth/Throat: Oropharynx is clear and moist.  Eyes: EOM are normal. Pupils are equal, round, and reactive to light.  Neck: Normal range of motion. Neck supple. No JVD present.  Normal range of motion of neck. Neck nontender to palpation. No nuchal rigidity noted.  Cardiovascular: Normal rate, normal heart sounds and intact distal pulses.   No murmur heard. Intact distal pulses at all 4 extremities.   Pulmonary/Chest: Effort normal. No stridor. No respiratory distress. He has wheezes. He exhibits no tenderness.  Wheezing heard. Normal work of  breathing. No respiratory distress noted.   Abdominal: Soft. There is no tenderness. There is no rebound and no guarding.  Musculoskeletal: Normal range of motion.  Neurological: He is alert and oriented to person, place, and time.  Cranial Nerves:  III,IV, VI: ptosis not present, extra-ocular movements intact bilaterally, direct and consensual pupillary light reflexes intact bilaterally V: facial sensation, jaw opening, and bite strength equal bilaterally VII: eyebrow raise, eyelid close, smile, frown, pucker equal bilaterally VIII: hearing grossly normal bilaterally  IX,X: palate elevation and swallowing intact XI: bilateral shoulder shrug and lateral head rotation equal and strong XII: midline tongue extension  Negative pronator drift, negative Romberg, negative RAM's, negative heel-to-shin, negative finger to nose.    Sensory intact.  Muscle strength 5/5 Patient able to ambulate without difficulty.   Skin: Skin is warm. Capillary refill takes less than 2 seconds.  No  Pitting edema noted. No leg swelling noted.   Psychiatric: He has a normal mood and affect. His behavior is normal.  Nursing note and vitals reviewed.    ED Treatments / Results  Labs (all labs ordered are listed, but only abnormal results are displayed) Labs Reviewed  COMPREHENSIVE METABOLIC PANEL - Abnormal; Notable for the following:       Result Value   CO2 21 (*)    BUN 38 (*)    Creatinine, Ser 3.82 (*)    Calcium 8.2 (*)    Albumin 3.4 (*)    AST 159 (*)    ALT 161 (*)    GFR calc non Af Amer 17 (*)    GFR calc Af Amer 19 (*)    All other components within normal limits  CBC - Abnormal; Notable for the following:    RBC 3.87 (*)    Hemoglobin 11.1 (*)    HCT 33.4 (*)    All other components within normal limits  BRAIN NATRIURETIC PEPTIDE - Abnormal; Notable for the following:    B Natriuretic Peptide 887.4 (*)    All other components within normal limits  I-STAT TROPOININ, ED - Abnormal; Notable  for the following:    Troponin i, poc 0.12 (*)    All other components within normal limits    EKG  EKG Interpretation  Date/Time:  Monday December 04 2016 10:48:49 EDT Ventricular Rate:  65 PR Interval:    QRS Duration: 101 QT Interval:  448 QTC Calculation: 466 R Axis:   87 Text Interpretation:  Sinus rhythm LVH with secondary repolarization abnormality Anterior ST elevation, probably due to LVH Since previous tracing t wave inversions in lateral leads are new Confirmed by Canary Brim  MD, MARTHA [phone]) on 12/04/2016 10:54:12 AM       Radiology Dg Chest 1 View  Result Date: 12/04/2016 CLINICAL DATA:  Questionable lesion right upper lobe. EXAM: CHEST 1 VIEW: APICAL LORDOTIC COMPARISON:  Frontal and lateral views of the chest obtained earlier in the day FINDINGS: On apical lordotic image, the area of questionable nodularity is no longer appreciable. On the current examination, there is no consolidation or apparent mass. Cardiomegaly with pulmonary venous hypertension is noted. No adenopathy. No bone lesions. IMPRESSION: No nodular lesion or consolidation in the area of concern in the right upper lobe on recent frontal chest radiograph. There is a degree of underlying pulmonary vascular congestion. Electronically Signed   By: Lowella Grip III M.D.   On: 12/04/2016 09:01   Dg Chest 2 View  Result Date: 12/04/2016 CLINICAL DATA:  Wheezing for 3 weeks EXAM: CHEST  2 VIEW COMPARISON:  October 22, 2014 FINDINGS: There is a slightly irregular opacity in or overlying the right upper lobe which likely represents a monitor lead. Elsewhere, there is slight interstitial pulmonary edema. There is cardiomegaly with pulmonary venous hypertension. No adenopathy. No bone lesions. IMPRESSION: Evidence a degree of congestive heart failure. Irregular opacity right upper lobe may represent overlying monitor leads; repeat study with removal of apparent monitor lead to exclude a small mass lesion in the right upper  lobe advised. No evident adenopathy. Electronically Signed   By: Lowella Grip III M.D.   On: 12/04/2016 08:13    Procedures Procedures (including critical care time)  Medications Ordered in ED Medications  ipratropium-albuterol (DUONEB) 0.5-2.5 (3) MG/3ML nebulizer solution 3 mL (3 mLs Nebulization Given 12/04/16 0812)  labetalol (NORMODYNE,TRANDATE) injection 20 mg (20 mg Intravenous Given 12/04/16 0958)  Initial Impression / Assessment and Plan / ED Course  I have reviewed the triage vital signs and the nursing notes.  Pertinent labs & imaging results that were available during my care of the patient were reviewed by me and considered in my medical decision making (see chart for details).     Patient here with likely new onset of congestive heart failure via Chest x-ray and BNP. Patient also concerning for hypertensive emergency with an increase in creatinine. Pt with obvious wheezing on presentation. Increased BP throughout time in ED. Afebrile and NAD. Abdomen soft and nontender. Neurologically intact. Patient has elevated troponin, elevated BNP here in ED. Lab work shows increase in creatinine, BUN, increase in LFTs, and decrease in GFR. Chest x-ray shows congestion concerning for CHF. EKG shows evidence of LVH.  Patient given breathing treatment and started on labetalol 20 mg.  11:00 I spoke with Dr. Doylene Canard who agreed to see and admit patient.   Pt case discussed with Dr. Canary Brim who agreed with assessment and plan.    Final Clinical Impressions(s) / ED Diagnoses   Final diagnoses:  Pain  Acute congestive heart failure with left ventricular diastolic dysfunction [facility])    New Prescriptions New Prescriptions   No medications on file     [place], [place] 12/04/16 [place], MD 12/04/16 1523

## 2016-12-04 NOTE — Progress Notes (Signed)
Patient is sleeping when RN enters room for rounding. Phlebotomist attempts to enter as well, will return for blood draw.

## 2016-12-04 NOTE — Progress Notes (Signed)
Pt BP was 169/118, denies chest pain or any kind of pain, denies nausea and vomiting, not in respiratory distress. Dr. Doylene Canard was notified and ordered to give Amlodipine 5mg  PO every 12hours and to give the Lasix IV now that is scheduled at 1am. Will conitnue to monitor pt.

## 2016-12-04 NOTE — ED Notes (Signed)
Pt transported to xray 

## 2016-12-04 NOTE — ED Notes (Signed)
Pt. Transported to unit on telemetry monitor with belongings (black tennis shoes, clothing, wallet, and phone with charger.

## 2016-12-04 NOTE — ED Notes (Signed)
Leads and stickers removed for chest xray per EDP.

## 2016-12-05 ENCOUNTER — Inpatient Hospital Stay (HOSPITAL_COMMUNITY): Payer: Self-pay

## 2016-12-05 LAB — URINALYSIS, ROUTINE W REFLEX MICROSCOPIC
BILIRUBIN URINE: NEGATIVE
Bacteria, UA: NONE SEEN
GLUCOSE, UA: NEGATIVE mg/dL
Ketones, ur: NEGATIVE mg/dL
Leukocytes, UA: NEGATIVE
NITRITE: NEGATIVE
Protein, ur: 30 mg/dL — AB
SPECIFIC GRAVITY, URINE: 1.006 (ref 1.005–1.030)
Squamous Epithelial / HPF: NONE SEEN
pH: 6 (ref 5.0–8.0)

## 2016-12-05 LAB — BASIC METABOLIC PANEL
Anion gap: 12 (ref 5–15)
BUN: 43 mg/dL — ABNORMAL HIGH (ref 6–20)
CHLORIDE: 101 mmol/L (ref 101–111)
CO2: 25 mmol/L (ref 22–32)
Calcium: 8.4 mg/dL — ABNORMAL LOW (ref 8.9–10.3)
Creatinine, Ser: 4.26 mg/dL — ABNORMAL HIGH (ref 0.61–1.24)
GFR calc non Af Amer: 15 mL/min — ABNORMAL LOW (ref >60)
GFR, EST AFRICAN AMERICAN: 17 mL/min — AB (ref >60)
Glucose, Bld: 98 mg/dL (ref 65–99)
POTASSIUM: 3.5 mmol/L (ref 3.5–5.1)
SODIUM: 138 mmol/L (ref 135–145)

## 2016-12-05 LAB — ECHOCARDIOGRAM COMPLETE
Height: 72 in
Weight: 3185.6 oz

## 2016-12-05 LAB — TROPONIN I
Troponin I: 0.14 ng/mL (ref <0.03)
Troponin I: 0.12 ng/mL (ref <0.03)

## 2016-12-05 LAB — HIV ANTIBODY (ROUTINE TESTING W REFLEX): HIV Screen 4th Generation wRfx: NONREACTIVE

## 2016-12-05 MED ORDER — FUROSEMIDE 40 MG PO TABS
40.0000 mg | ORAL_TABLET | Freq: Two times a day (BID) | ORAL | Status: DC
  Administered 2016-12-05 – 2016-12-07 (×4): 40 mg via ORAL
  Filled 2016-12-05 (×4): qty 1

## 2016-12-05 NOTE — Progress Notes (Signed)
VSS during 7 a to 7 p shift, blood pressure continues to be elevated but improved from admission.  Patient ambulates frequently in hallway.  Denies pain, mild shortness of breath.  RN began education on heart failure with living with heart failure booklet, discussed importance of daily weights, zone tool, low sodium foods and when to notify his doctor (yellow zone).  Patient verbalized understanding.

## 2016-12-05 NOTE — Consult Note (Signed)
[facility] Renal Consultation Note  Requesting MD: Doylene Canard Indication for Consultation:   HPI:   Gabriel Ashley is a 51 y.o. male with past medical history significant for hypertension long-standing and poorly controlled as well as asthma. Patient admits that he hasn't been taking his blood pressure medications and hasn't been taking care of himself. He also says he's been smoking marijuana and using cocaine.  Also of note, he goes to the gym and lift weights and has been taking creatine supplements. He denies any urinary symptoms, has never had a kidney stone or frequent urinary tract infections. He denies excessive use of NSAIDs. He's not been taking medications but an ACE or ARB are not among them. When admitted was short of breath and was felt to be volume overloaded. He received 1 dose of IV Lasix. Symptomatically is improved. Also had an echocardiogram today revealing EF of 40%.  Blood pressure has been high here. The currently on Norvasc 5 daily and hydralazine 25 every 6. He tells me that he's been aware of his kidney dysfunction for several years but has never seen a kidney doctor  Creatinine, Ser  Date/Time Value Ref Range Status  12/05/2016 03:39 AM 4.26 (H) 0.61 - 1.24 mg/dL Final  12/04/2016 07:46 AM 3.82 (H) 0.61 - 1.24 mg/dL Final  02/15/2015 06:20 AM 1.87 (H) 0.61 - 1.24 mg/dL Final  02/11/2015 11:50 PM 2.23 (H) 0.61 - 1.24 mg/dL Final  10/22/2014 07:24 AM 2.02 (H) 0.50 - 1.35 mg/dL Final  05/08/2014 07:25 PM 2.89 (H) 0.50 - 1.35 mg/dL Final  05/05/2014 01:05 PM 2.65 (H) 0.50 - 1.35 mg/dL Final  05/05/2014 10:08 AM 2.73 (H) 0.50 - 1.35 mg/dL Final  08/11/2013 10:00 PM 1.87 (H) 0.50 - 1.35 mg/dL Final     PMHx:   Past Medical History:  Diagnosis Date  . Anxiety   . Asthma   . Hypertension   . Post traumatic stress disorder (PTSD)     History reviewed. No pertinent surgical history.  Family Hx: History reviewed. No pertinent family history.  Social  History:  reports that he has been smoking.  He has a 31.00 pack-year smoking history. He has never used smokeless tobacco. He reports that he drinks alcohol. He reports that he uses drugs, including Cocaine and Marijuana.  Allergies: No Known Allergies  Medications: Prior to Admission medications   Medication Sig Start Date End Date Taking? Authorizing Provider  acetaminophen (TYLENOL) 325 MG tablet Take 650 mg by mouth every 6 (six) hours as needed for mild pain.   Yes Historical Provider, MD  Multiple Vitamin (MULTIVITAMIN) tablet Take 1 tablet by mouth daily.   Yes Historical Provider, MD  albuterol (PROVENTIL HFA;VENTOLIN HFA) 108 (90 BASE) MCG/ACT inhaler Inhale 1-2 puffs into the lungs every 4 (four) hours as needed for wheezing or shortness of breath. Patient not taking: Reported on 12/04/2016 05/12/14   Encarnacion Slates, NP  amLODipine (NORVASC) 10 MG tablet Take 1 tablet (10 mg total) by mouth daily. Patient not taking: Reported on 12/04/2016 02/17/15   Nicholaus Bloom, MD  buPROPion (WELLBUTRIN XL) 300 MG 24 hr tablet Take 1 tablet (300 mg total) by mouth daily. Patient not taking: Reported on 12/04/2016 02/17/15   Nicholaus Bloom, MD  busPIRone (BUSPAR) 10 MG tablet Take 1 tablet (10 mg total) by mouth 3 (three) times daily. Patient not taking: Reported on 12/04/2016 02/17/15   Nicholaus Bloom, MD  cloNIDine (CATAPRES) 0.1 MG tablet Take 1 tablet (0.1 mg  total) by mouth 2 (two) times daily. Patient not taking: Reported on 12/04/2016 02/17/15   Nicholaus Bloom, MD  diclofenac sodium (VOLTAREN) 1 % GEL Apply 2 g topically 4 (four) times daily as needed (pain). Patient not taking: Reported on 12/04/2016 02/17/15   Nicholaus Bloom, MD  QUEtiapine (SEROQUEL) 100 MG tablet Take 1 tablet (100 mg total) by mouth at bedtime. Patient not taking: Reported on 12/04/2016 02/17/15   Nicholaus Bloom, MD  traZODone (DESYREL) 50 MG tablet Take 1 tablet (50 mg total) by mouth at bedtime as needed for sleep. Patient not taking:  Reported on 12/04/2016 02/17/15   Nicholaus Bloom, MD    I have reviewed the patient's current medications.  Labs:  Results for orders placed or performed during the hospital encounter of 12/04/16 (from the past 48 hour(s))  Comprehensive metabolic panel     Status: Abnormal   Collection Time: 12/04/16  7:46 AM  Result Value Ref Range   Sodium 139 135 - 145 mmol/L   Potassium 4.2 3.5 - 5.1 mmol/L   Chloride 106 101 - 111 mmol/L   CO2 21 (L) 22 - 32 mmol/L   Glucose, Bld 96 65 - 99 mg/dL   BUN 38 (H) 6 - 20 mg/dL   Creatinine, Ser 3.82 (H) 0.61 - 1.24 mg/dL   Calcium 8.2 (L) 8.9 - 10.3 mg/dL   Total Protein 6.7 6.5 - 8.1 g/dL   Albumin 3.4 (L) 3.5 - 5.0 g/dL   AST 159 (H) 15 - 41 U/L   ALT 161 (H) 17 - 63 U/L   Alkaline Phosphatase 107 38 - 126 U/L   Total Bilirubin 0.5 0.3 - 1.2 mg/dL   GFR calc non Af Amer 17 (L) >60 mL/min   GFR calc Af Amer 19 (L) >60 mL/min    Comment: (NOTE) The eGFR has been calculated using the CKD EPI equation. This calculation has not been validated in all clinical situations. eGFR's persistently <60 mL/min signify possible Chronic Kidney Disease.    Anion gap 12 5 - 15  CBC     Status: Abnormal   Collection Time: 12/04/16  7:46 AM  Result Value Ref Range   WBC 6.4 4.0 - 10.5 K/uL   RBC 3.87 (L) 4.22 - 5.81 MIL/uL   Hemoglobin 11.1 (L) 13.0 - 17.0 g/dL   HCT 33.4 (L) 39.0 - 52.0 %   MCV 86.3 78.0 - 100.0 fL   MCH 28.7 26.0 - 34.0 pg   MCHC 33.2 30.0 - 36.0 g/dL   RDW 14.9 11.5 - 15.5 %   Platelets 196 150 - 400 K/uL  Brain natriuretic peptide     Status: Abnormal   Collection Time: 12/04/16  9:49 AM  Result Value Ref Range   B Natriuretic Peptide 887.4 (H) 0.0 - 100.0 pg/mL  I-stat troponin, ED     Status: Abnormal   Collection Time: 12/04/16 10:03 AM  Result Value Ref Range   Troponin i, poc 0.12 (HH) 0.00 - 0.08 ng/mL   Comment NOTIFIED PHYSICIAN    Comment 3            Comment: Due to the release kinetics of cTnI, a negative result within  the first hours of the onset of symptoms does not rule out myocardial infarction with certainty. If myocardial infarction is still suspected, repeat the test at appropriate intervals.   HIV antibody (Routine Testing)     Status: None   Collection Time: 12/04/16 11:54 AM  Result Value Ref Range   HIV Screen 4th Generation wRfx Non Reactive Non Reactive    Comment: (NOTE) Performed At: [facility] [place], [place] [id number] Lindon Romp MD EZ:[id number]   Troponin I     Status: Abnormal   Collection Time: 12/04/16 11:54 AM  Result Value Ref Range   Troponin I 0.14 (HH) <0.03 ng/mL    Comment: CRITICAL RESULT CALLED TO, READ BACK BY AND VERIFIED WITH: A TOBIAS,RN [id number] WILDERK CORRECT DATE IS [id number]   Troponin I     Status: Abnormal   Collection Time: 12/04/16  4:52 PM  Result Value Ref Range   Troponin I 0.11 (HH) <0.03 ng/mL    Comment: CRITICAL VALUE NOTED.  VALUE IS CONSISTENT WITH PREVIOUSLY REPORTED AND CALLED VALUE.  Troponin I     Status: Abnormal   Collection Time: 12/04/16 10:51 PM  Result Value Ref Range   Troponin I 0.12 (HH) <0.03 ng/mL    Comment: CRITICAL VALUE NOTED.  VALUE IS CONSISTENT WITH PREVIOUSLY REPORTED AND CALLED VALUE.  Basic metabolic panel     Status: Abnormal   Collection Time: 12/05/16  3:39 AM  Result Value Ref Range   Sodium 138 135 - 145 mmol/L   Potassium 3.5 3.5 - 5.1 mmol/L   Chloride 101 101 - 111 mmol/L   CO2 25 22 - 32 mmol/L   Glucose, Bld 98 65 - 99 mg/dL   BUN 43 (H) 6 - 20 mg/dL   Creatinine, Ser 4.26 (H) 0.61 - 1.24 mg/dL   Calcium 8.4 (L) 8.9 - 10.3 mg/dL   GFR calc non Af Amer 15 (L) >60 mL/min   GFR calc Af Amer 17 (L) >60 mL/min    Comment: (NOTE) The eGFR has been calculated using the CKD EPI equation. This calculation has not been validated in all clinical situations. eGFR's persistently <60 mL/min signify possible Chronic Kidney Disease.    Anion gap 12 5 - 15     ROS:  A  comprehensive review of systems was negative except for: Constitutional: positive for fatigue Cardiovascular: positive for dyspnea  Physical Exam: Vitals:   12/05/16 0527 12/05/16 1221  BP: (!) 145/96 (!) 166/110  Pulse: (!) 55 60  Resp: 18   Temp: 97.8 F (36.6 C)      General: Well appearing black male in no acute distress when lying flat. When asked to sit up to listen to his lungs became short of breath HEENT: Pupils are equally round reactive to light, extra motions are intact, mucous membranes are moist Neck: Possible JVD Heart: Regular rate and rhythm Lungs: Really mostly clear Abdomen: Soft, nontender, nondistended Extremities: No obvious peripheral edema Skin: Warm and dry Neuro: Alert and oriented. Nonfocal  Assessment/Plan: 51 year old black male with long-standing hypertension poorly controlled who has a history dating back to 2014 of chronic kidney disease but now with worsening numbers in the setting of malignant hypertension and mild cardiomyopathy 1.Renal- acute kidney injury versus progression of chronic kidney disease. He could have acute kidney injury in the setting of malignant hypertension and also creatine use. However, I'm more concerned that this is just progressive chronic renal insufficiency from long-standing poorly controlled hypertension. I will check a urinalysis, renal ultrasound, SPEP as well as checking for anemia and secondary hyperparathyroidism secondary to CKD.  Fortunately he doesn't appear to have uremic symptoms so does not need dialysis yet but he has dangerously close and I have explained that to him. Hopefully, we can  rule out reversible things, get his blood pressure under control and gain back a little bit of kidney function for him.  This will need to be followed closely as an outpatient 2. Hypertension/volume  - poorly controlled and has not been compliant with medications also exacerbated by cocaine use. I explained to him in no uncertain terms  that the blood pressure needs to get corrected in order to attempt to regain some kidney function and heart function.  He needs a simple regimen that he can keep up with- will do Lasix twice daily, continue Norvasc once daily but feel hydralazine might be too much for him. Consider carvedilol. If his renal function stabilizes as an outpatient I would use an Ace or an ARB 3. Anemia  - hemoglobin lower than what I would expect. We'll check iron stores and treat as needed   Sakiyah Shur A 12/05/2016, 3:11 PM

## 2016-12-05 NOTE — Care Management Note (Signed)
Case Management Note  Patient Details  Name: Gabriel Ashley MRN: [id number] Date of Birth: 1966-05-17  Subjective/Objective:  Admitted with Acute Health Failure                 Action/Plan: Patient is independent of all of his ADL's; has private insurance with [employer] ( [employer] [facility]/ covered 100% per patient); he knows to get his medication filled there at discharge; CM talked to patient about the importance of follow up care; he stated that he will make his apt to be seen at the [place] in [place]; Patient also stated that he was going to stop taking drugs; Emotional support given.  Expected Discharge Date:  12/07/16               Expected Discharge Plan:  Home/Self Care  Discharge planning Services  CM Consult  Status of Service:  In process, will continue to follow  Sherrilyn Rist [phone] 12/05/2016, 3:15 PM

## 2016-12-05 NOTE — Progress Notes (Signed)
Discussed importance of medication compliance with patient.

## 2016-12-05 NOTE — Plan of Care (Signed)
Problem: Cardiac: Goal: Ability to achieve and maintain adequate cardiopulmonary perfusion will improve Outcome: Progressing Patient maintains oxygen saturation in the 90's on room air   Problem: Education: Goal: Ability to demonstrate managment of disease process will improve Outcome: Progressing Patient acknowledges the necessity for his blood pressure pills and heart failure education started  Goal: Ability to verbalize understanding of medication therapies will improve Outcome: Progressing Was ordered Hydralazine and Norvasc at home but had not taken; verbalizes the importance of compliance with these meds when discharged   Problem: Health Behavior/Discharge Planning: Goal: Ability to manage health-related needs will improve for discharge Outcome: Progressing Heart failure education begun with heart failure booklet, reviewing zones and sodium content of foods

## 2016-12-05 NOTE — Progress Notes (Signed)
Ref: PROVIDER NOT IN SYSTEM   Subjective:  Breathing better. Improving blood pressure control. Patient aware of renal dysfunction for some time but had no work up/follow up as he did not take his blood pressure medications also.  Objective:  Vital Signs in the last 24 hours: Temp:  [97.6 F (36.4 C)-98.1 F (36.7 C)] 97.8 F (36.6 C) (03/20 0527) Pulse Rate:  [55-71] 55 (03/20 0527) Cardiac Rhythm: Normal sinus rhythm (03/20 0700) Resp:  [17-24] 18 (03/20 0527) BP: (145-194)/(86-133) 145/96 (03/20 0527) SpO2:  [93 %-100 %] 100 % (03/20 0527) FiO2 (%):  [21 %] 21 % (03/19 1127) Weight:  [90.3 kg (199 lb 1.6 oz)-91.9 kg (202 lb 8 oz)] 90.3 kg (199 lb 1.6 oz) (03/20 0527)  Physical Exam: BP Readings from Last 1 Encounters:  12/05/16 (!) 145/96    Wt Readings from Last 1 Encounters:  12/05/16 90.3 kg (199 lb 1.6 oz)    Weight change:  Body mass index is 27 kg/m. HEENT: [place]/AT, Eyes-Brown, PERL, EOMI, Conjunctiva-Pink, Sclera-Non-icteric Neck: No JVD, No bruit, Trachea midline. Lungs:  Clear, Bilateral. Cardiac:  Regular rhythm, normal S1 and S2, no S3. II/VI systolic murmur. Abdomen:  Soft, non-tender. BS present. Extremities:  No edema present. No cyanosis. No clubbing. CNS: AxOx3, Cranial nerves grossly intact, moves all 4 extremities.  Skin: Warm and dry.   Intake/Output from previous day: 03/19 0701 - 03/20 0700 In: 783 [P.O.:780; I.V.:3] Out: 1920 [Urine:1920]    Lab Results: BMET    Component Value Date/Time   NA 138 12/05/2016 0339   NA 139 12/04/2016 0746   NA 139 02/15/2015 0620   K 3.5 12/05/2016 0339   K 4.2 12/04/2016 0746   K 3.8 02/15/2015 0620   CL 101 12/05/2016 0339   CL 106 12/04/2016 0746   CL 108 02/15/2015 0620   CO2 25 12/05/2016 0339   CO2 21 (L) 12/04/2016 0746   CO2 24 02/15/2015 0620   GLUCOSE 98 12/05/2016 0339   GLUCOSE 96 12/04/2016 0746   GLUCOSE 90 02/15/2015 0620   BUN 43 (H) 12/05/2016 0339   BUN 38 (H) 12/04/2016 0746   BUN  24 (H) 02/15/2015 0620   CREATININE 4.26 (H) 12/05/2016 0339   CREATININE 3.82 (H) 12/04/2016 0746   CREATININE 1.87 (H) 02/15/2015 0620   CALCIUM 8.4 (L) 12/05/2016 0339   CALCIUM 8.2 (L) 12/04/2016 0746   CALCIUM 8.6 (L) 02/15/2015 0620   GFRNONAA 15 (L) 12/05/2016 0339   GFRNONAA 17 (L) 12/04/2016 0746   GFRNONAA 40 (L) 02/15/2015 0620   GFRAA 17 (L) 12/05/2016 0339   GFRAA 19 (L) 12/04/2016 0746   GFRAA 47 (L) 02/15/2015 0620   CBC    Component Value Date/Time   WBC 6.4 12/04/2016 0746   RBC 3.87 (L) 12/04/2016 0746   HGB 11.1 (L) 12/04/2016 0746   HCT 33.4 (L) 12/04/2016 0746   PLT 196 12/04/2016 0746   MCV 86.3 12/04/2016 0746   MCH 28.7 12/04/2016 0746   MCHC 33.2 12/04/2016 0746   RDW 14.9 12/04/2016 0746   LYMPHSABS 1.4 05/05/2014 1008   MONOABS 0.5 05/05/2014 1008   EOSABS 0.0 05/05/2014 1008   BASOSABS 0.0 05/05/2014 1008   HEPATIC Function Panel  Recent Labs  12/04/16 0746  PROT 6.7   HEMOGLOBIN A1C No components found for: HGA1C,  MPG CARDIAC ENZYMES Lab Results  Component Value Date   TROPONINI 0.12 ([facility]) 12/04/2016   TROPONINI 0.11 ([place]) 12/04/2016   TROPONINI 0.14 ([place]) 12/04/2016  BNP No results for input(s): PROBNP in the last 8760 hours. TSH No results for input(s): TSH in the last 8760 hours. CHOLESTEROL No results for input(s): CHOL in the last 8760 hours.  Scheduled Meds: . amLODipine  5 mg Oral BID  . furosemide  40 mg Intravenous Q12H  . heparin  5,000 Units Subcutaneous Q8H  . hydrALAZINE  25 mg Oral Q6H  . sodium chloride flush  3 mL Intravenous Q12H   Continuous Infusions: PRN Meds:.sodium chloride, acetaminophen, ondansetron (ZOFRAN) IV, sodium chloride flush  Assessment/Plan: Acute CHF, diastolic Hypertension CKD, IV  Renal consult. Awaiting echocardiogram for LV function.   LOS: 1 day    Dixie Dials  MD  12/05/2016, 10:02 AM

## 2016-12-05 NOTE — Progress Notes (Signed)
Echocardiogram 2D Echocardiogram has been performed.  Aggie Cosier 12/05/2016, 11:24 AM

## 2016-12-06 ENCOUNTER — Inpatient Hospital Stay (HOSPITAL_COMMUNITY): Payer: Self-pay

## 2016-12-06 LAB — RENAL FUNCTION PANEL
ALBUMIN: 3.6 g/dL (ref 3.5–5.0)
ANION GAP: 12 (ref 5–15)
BUN: 40 mg/dL — AB (ref 6–20)
CHLORIDE: 100 mmol/L — AB (ref 101–111)
CO2: 25 mmol/L (ref 22–32)
Calcium: 8.6 mg/dL — ABNORMAL LOW (ref 8.9–10.3)
Creatinine, Ser: 4.17 mg/dL — ABNORMAL HIGH (ref 0.61–1.24)
GFR, EST AFRICAN AMERICAN: 17 mL/min — AB (ref >60)
GFR, EST NON AFRICAN AMERICAN: 15 mL/min — AB (ref >60)
Glucose, Bld: 92 mg/dL (ref 65–99)
PHOSPHORUS: 4.8 mg/dL — AB (ref 2.5–4.6)
POTASSIUM: 3.4 mmol/L — AB (ref 3.5–5.1)
Sodium: 137 mmol/L (ref 135–145)

## 2016-12-06 LAB — PSA: PSA: 1.76 ng/mL (ref 0.00–4.00)

## 2016-12-06 LAB — URIC ACID: URIC ACID, SERUM: 10.3 mg/dL — AB (ref 4.4–7.6)

## 2016-12-06 LAB — FERRITIN: Ferritin: 161 ng/mL (ref 24–336)

## 2016-12-06 LAB — IRON AND TIBC
IRON: 65 ug/dL (ref 45–182)
Saturation Ratios: 23 % (ref 17.9–39.5)
TIBC: 284 ug/dL (ref 250–450)
UIBC: 219 ug/dL

## 2016-12-06 MED ORDER — CARVEDILOL 3.125 MG PO TABS
3.1250 mg | ORAL_TABLET | Freq: Two times a day (BID) | ORAL | Status: DC
  Administered 2016-12-06 – 2016-12-07 (×2): 3.125 mg via ORAL
  Filled 2016-12-06 (×2): qty 1

## 2016-12-06 MED ORDER — TECHNETIUM TC 99M TETROFOSMIN IV KIT
10.0000 | PACK | Freq: Once | INTRAVENOUS | Status: AC | PRN
  Administered 2016-12-06: 10 via INTRAVENOUS

## 2016-12-06 MED ORDER — TECHNETIUM TC 99M TETROFOSMIN IV KIT
30.0000 | PACK | Freq: Once | INTRAVENOUS | Status: AC | PRN
  Administered 2016-12-06: 30 via INTRAVENOUS

## 2016-12-06 MED ORDER — REGADENOSON 0.4 MG/5ML IV SOLN
0.4000 mg | Freq: Once | INTRAVENOUS | Status: AC
  Administered 2016-12-06: 0.4 mg via INTRAVENOUS
  Filled 2016-12-06: qty 5

## 2016-12-06 MED ORDER — REGADENOSON 0.4 MG/5ML IV SOLN
INTRAVENOUS | Status: AC
  Filled 2016-12-06: qty 5

## 2016-12-06 MED ORDER — ALLOPURINOL 100 MG PO TABS
100.0000 mg | ORAL_TABLET | Freq: Every day | ORAL | Status: DC
  Administered 2016-12-06 – 2016-12-07 (×2): 100 mg via ORAL
  Filled 2016-12-06 (×2): qty 1

## 2016-12-06 NOTE — Progress Notes (Signed)
Patient asleep.

## 2016-12-06 NOTE — Progress Notes (Signed)
Subjective:  No new complaints - awaiting results of his stress test. BP is maybe Ashley little better - creatinine slightly better as well but still abnormal- renal ultrasound = 9 cm kidneys echogenic - no hydro  - U/Ashley with 30 prot and minimal cells    Objective Vital signs in last 24 hours: Vitals:   12/06/16 0901 12/06/16 0905 12/06/16 0907 12/06/16 0909  BP: (!) 167/107 (!) 165/109 (!) 175/104 (!) 165/90  Pulse:      Resp:      Temp:      TempSrc:      SpO2:      Weight:      Height:       Weight change: -6.26 kg (-13 lb 12.8 oz)  Intake/Output Summary (Last 24 hours) at 12/06/16 1130 Last data filed at 12/06/16 1118  Gross per 24 hour  Intake              835 ml  Output             1675 ml  Net             -840 ml    Assessment/Plan: 51 year old black male with long-standing hypertension poorly controlled who has Ashley history dating back to 2014 of chronic kidney disease but now with worsening numbers in the setting of malignant hypertension and mild cardiomyopathy 1.Renal- acute kidney injury versus progression of chronic kidney disease. Possible acute kidney injury in the setting of malignant hypertension /creatine use. However, I'm more concerned this is progressive chronic renal insufficiency from long-standing poorly controlled hypertension.  Urinalysis pretty bland, renal ultrasound shows medical renal disease, SPEP pending.  Fortunately he doesn't appear to have uremic symptoms so does not need dialysis but CKD is advanced- I have explained that to him. Hopefully, we can get his blood pressure under control and gain back Ashley little bit of kidney function for him.  This will need to be followed closely as an outpatient.  He does not need any further inpatient renal workup - if plan is for discharge is OK I can see him as OP 2. Hypertension/volume  - poorly controlled and has not been compliant with medications also exacerbated by cocaine use. I explained to him  that the blood pressure  needs to get corrected in order to attempt to regain some kidney function and heart function.  He needs Ashley simple regimen that he can keep up with- will do Lasix twice daily, continue Norvasc BID but feel hydralazine might be too much for him at QID- at least make it BID as well. Consider carvedilol. If his renal function stabilizes as an outpatient I would use an Ace or an ARB.  BP is better- difficult to get good control in  Inpatient setting- will ned fine tuning as OP 3. Anemia  - hemoglobin lower than what I would expect.  iron stores slightly low    Gabriel Ashley    Labs: Basic Metabolic Panel:  Recent Labs Lab 12/04/16 0746 12/05/16 0339 12/06/16 0558  NA 139 138 137  K 4.2 3.5 3.4*  CL 106 101 100*  CO2 21* 25 25  GLUCOSE 96 98 92  BUN 38* 43* 40*  CREATININE 3.82* 4.26* 4.17*  CALCIUM 8.2* 8.4* 8.6*  PHOS  --   --  4.8*   Liver Function Tests:  Recent Labs Lab 12/04/16 0746 12/06/16 0558  AST 159*  --   ALT 161*  --   ALKPHOS 107  --  BILITOT 0.5  --   PROT 6.7  --   ALBUMIN 3.4* 3.6   No results for input(s): LIPASE, AMYLASE in the last 168 hours. No results for input(s): AMMONIA in the last 168 hours. CBC:  Recent Labs Lab 12/04/16 0746  WBC 6.4  HGB 11.1*  HCT 33.4*  MCV 86.3  PLT 196   Cardiac Enzymes:  Recent Labs Lab 12/04/16 1154 12/04/16 1652 12/04/16 2251  TROPONINI 0.14* 0.11* 0.12*   CBG: No results for input(s): GLUCAP in the last 168 hours.  Iron Studies:  Recent Labs  12/06/16 0558  IRON 65  TIBC 284  FERRITIN 161   Studies/Results: US Renal  Result Date: 12/05/2016 CLINICAL DATA:  Chronic kidney disease. EXAM: RENAL / URINARY TRACT ULTRASOUND COMPLETE COMPARISON:  None. FINDINGS: Right Kidney: Length: 9.2 cm. Diffusely echogenic. 6 mm upper pole cyst. No hydronephrosis. Left Kidney: Length: 9.5 cm. Diffusely echogenic. 1.0 cm upper pole cyst. 4.7 cm mid to lower pole cyst containing 2 thin internal septations.  No hydronephrosis. Bladder: Mildly to moderately enlarged prostate gland protruding into the base of the urinary bladder. Otherwise, normal appearing bladder. IMPRESSION: 1. Diffusely echogenic kidneys, compatible with medical renal disease. 2. No hydronephrosis. 3. Mildly to moderately enlarged prostate gland. Electronically Signed   By: Claudie Revering M.D.   On: 12/05/2016 16:30   Medications: Infusions:   Scheduled Medications: . regadenoson      . amLODipine  5 mg Oral BID  . furosemide  40 mg Oral BID  . heparin  5,000 Units Subcutaneous Q8H  . hydrALAZINE  25 mg Oral Q6H  . sodium chloride flush  3 mL Intravenous Q12H    have reviewed scheduled and prn medications.  Physical Exam: General: alert, NAD Heart: RRR Lungs: mostly clear Abdomen: soft, non tender Extremities: no edema    12/06/2016,11:30 AM  LOS: 2 days       \

## 2016-12-06 NOTE — Progress Notes (Signed)
Ref: PROVIDER NOT IN SYSTEM   Subjective:  No reversible ischemia on nuclear stress test but large area of fixed defect.  Uric acid level is high. Improving blood pressure control. Normal iron level.  Objective:  Vital Signs in the last 24 hours: Temp:  [97.7 F (36.5 C)-98.6 F (37 C)] 97.7 F (36.5 C) (03/21 1251) Pulse Rate:  [58-67] 65 (03/21 1251) Cardiac Rhythm: Normal sinus rhythm (03/21 0744) Resp:  [16-18] 18 (03/21 1251) BP: (147-175)/(90-109) 156/92 (03/21 1251) SpO2:  [98 %-100 %] 100 % (03/21 1251) Weight:  [88.5 kg (195 lb 3.2 oz)] 88.5 kg (195 lb 3.2 oz) (03/21 0981)  Physical Exam: BP Readings from Last 1 Encounters:  12/06/16 (!) 156/92    Wt Readings from Last 1 Encounters:  12/06/16 88.5 kg (195 lb 3.2 oz)    Weight change: -6.26 kg (-13 lb 12.8 oz) Body mass index is 26.47 kg/m. HEENT: [place]/AT, Eyes-Brown, PERL, EOMI, Conjunctiva-Pink, Sclera-Non-icteric Neck: No JVD, No bruit, Trachea midline. Lungs:  Clear, Bilateral. Cardiac:  Regular rhythm, normal S1 and S2, no S3. II/VI systolic murmur. Abdomen:  Soft, non-tender. BS present. Extremities:  No edema present. No cyanosis. No clubbing. CNS: AxOx3, Cranial nerves grossly intact, moves all 4 extremities.  Skin: Warm and dry.   Intake/Output from previous day: 03/20 0701 - 03/21 0700 In: 900 [P.O.:900] Out: 1675 [Urine:1675]    Lab Results: BMET    Component Value Date/Time   NA 137 12/06/2016 0558   NA 138 12/05/2016 0339   NA 139 12/04/2016 0746   K 3.4 (L) 12/06/2016 0558   K 3.5 12/05/2016 0339   K 4.2 12/04/2016 0746   CL 100 (L) 12/06/2016 0558   CL 101 12/05/2016 0339   CL 106 12/04/2016 0746   CO2 25 12/06/2016 0558   CO2 25 12/05/2016 0339   CO2 21 (L) 12/04/2016 0746   GLUCOSE 92 12/06/2016 0558   GLUCOSE 98 12/05/2016 0339   GLUCOSE 96 12/04/2016 0746   BUN 40 (H) 12/06/2016 0558   BUN 43 (H) 12/05/2016 0339   BUN 38 (H) 12/04/2016 0746   CREATININE 4.17 (H) 12/06/2016  0558   CREATININE 4.26 (H) 12/05/2016 0339   CREATININE 3.82 (H) 12/04/2016 0746   CALCIUM 8.6 (L) 12/06/2016 0558   CALCIUM 8.4 (L) 12/05/2016 0339   CALCIUM 8.2 (L) 12/04/2016 0746   GFRNONAA 15 (L) 12/06/2016 0558   GFRNONAA 15 (L) 12/05/2016 0339   GFRNONAA 17 (L) 12/04/2016 0746   GFRAA 17 (L) 12/06/2016 0558   GFRAA 17 (L) 12/05/2016 0339   GFRAA 19 (L) 12/04/2016 0746   CBC    Component Value Date/Time   WBC 6.4 12/04/2016 0746   RBC 3.87 (L) 12/04/2016 0746   HGB 11.1 (L) 12/04/2016 0746   HCT 33.4 (L) 12/04/2016 0746   PLT 196 12/04/2016 0746   MCV 86.3 12/04/2016 0746   MCH 28.7 12/04/2016 0746   MCHC 33.2 12/04/2016 0746   RDW 14.9 12/04/2016 0746   LYMPHSABS 1.4 05/05/2014 1008   MONOABS 0.5 05/05/2014 1008   EOSABS 0.0 05/05/2014 1008   BASOSABS 0.0 05/05/2014 1008   HEPATIC Function Panel  Recent Labs  12/04/16 0746  PROT 6.7   HEMOGLOBIN A1C No components found for: HGA1C,  MPG CARDIAC ENZYMES Lab Results  Component Value Date   TROPONINI 0.12 ([facility]) 12/04/2016   TROPONINI 0.11 ([facility]) 12/04/2016   TROPONINI 0.14 ([facility]) 12/04/2016   BNP No results for input(s): PROBNP in the last 8760 hours.  TSH No results for input(s): TSH in the last 8760 hours. CHOLESTEROL No results for input(s): CHOL in the last 8760 hours.  Scheduled Meds: . allopurinol  100 mg Oral Daily  . amLODipine  5 mg Oral BID  . carvedilol  3.125 mg Oral BID WC  . furosemide  40 mg Oral BID  . heparin  5,000 Units Subcutaneous Q8H  . hydrALAZINE  25 mg Oral Q6H  . regadenoson      . sodium chloride flush  3 mL Intravenous Q12H   Continuous Infusions: PRN Meds:.sodium chloride, acetaminophen, ondansetron (ZOFRAN) IV, sodium chloride flush  Assessment/Plan: Acute systolic and diastolic heart failure Hypertension CKD, IV Hyperuricemia BPH Anemia Abnormal troponin- I due to demand ischemia  Start carvedilol and small dose allopurinol. Patient agrees to improve diet,  activity and taking care of hypertension. Appreciate renal consult.   LOS: 2 days    Dixie Dials  MD  12/06/2016, 6:37 PM

## 2016-12-07 LAB — PROTEIN ELECTROPHORESIS, SERUM
A/G Ratio: 1 (ref 0.7–1.7)
Albumin ELP: 3.6 g/dL (ref 2.9–4.4)
Alpha-1-Globulin: 0.3 g/dL (ref 0.0–0.4)
Alpha-2-Globulin: 0.8 g/dL (ref 0.4–1.0)
BETA GLOBULIN: 1.1 g/dL (ref 0.7–1.3)
GAMMA GLOBULIN: 1.5 g/dL (ref 0.4–1.8)
Globulin, Total: 3.6 g/dL (ref 2.2–3.9)
TOTAL PROTEIN ELP: 7.2 g/dL (ref 6.0–8.5)

## 2016-12-07 LAB — RENAL FUNCTION PANEL
Albumin: 3.6 g/dL (ref 3.5–5.0)
Anion gap: 13 (ref 5–15)
BUN: 43 mg/dL — AB (ref 6–20)
CHLORIDE: 102 mmol/L (ref 101–111)
CO2: 23 mmol/L (ref 22–32)
CREATININE: 4.19 mg/dL — AB (ref 0.61–1.24)
Calcium: 8.7 mg/dL — ABNORMAL LOW (ref 8.9–10.3)
GFR calc Af Amer: 17 mL/min — ABNORMAL LOW (ref >60)
GFR, EST NON AFRICAN AMERICAN: 15 mL/min — AB (ref >60)
Glucose, Bld: 93 mg/dL (ref 65–99)
POTASSIUM: 3.8 mmol/L (ref 3.5–5.1)
Phosphorus: 5.2 mg/dL — ABNORMAL HIGH (ref 2.5–4.6)
Sodium: 138 mmol/L (ref 135–145)

## 2016-12-07 LAB — PARATHYROID HORMONE, INTACT (NO CA): PTH: 90 pg/mL — AB (ref 15–65)

## 2016-12-07 MED ORDER — CLONIDINE HCL 0.1 MG PO TABS
0.1000 mg | ORAL_TABLET | Freq: Once | ORAL | Status: AC
  Administered 2016-12-07: 0.1 mg via ORAL
  Filled 2016-12-07: qty 1

## 2016-12-07 MED ORDER — FUROSEMIDE 40 MG PO TABS: 40.0000 mg | ORAL_TABLET | Freq: Two times a day (BID) | ORAL | 3 refills | Status: DC

## 2016-12-07 MED ORDER — CARVEDILOL 3.125 MG PO TABS: 3.1250 mg | ORAL_TABLET | Freq: Two times a day (BID) | ORAL | 3 refills | Status: DC

## 2016-12-07 MED ORDER — AMLODIPINE BESYLATE 10 MG PO TABS: 10.0000 mg | ORAL_TABLET | Freq: Every day | ORAL | 3 refills | Status: DC

## 2016-12-07 MED ORDER — ALLOPURINOL 100 MG PO TABS: 100.0000 mg | ORAL_TABLET | Freq: Every day | ORAL | 3 refills | Status: DC

## 2016-12-07 MED ORDER — HYDRALAZINE HCL 50 MG PO TABS: 25.0000 mg | ORAL_TABLET | Freq: Two times a day (BID) | ORAL | 3 refills | Status: DC

## 2016-12-07 MED ORDER — CLONIDINE HCL 0.2 MG PO TABS: 0.2000 mg | ORAL_TABLET | Freq: Two times a day (BID) | ORAL | 3 refills | Status: DC

## 2016-12-07 NOTE — Progress Notes (Signed)
Subjective:  No new complaints - no reversible ischemia on nuclear test-  BP is maybe a little better - creatinine stable  Objective Vital signs in last 24 hours: Vitals:   12/06/16 1251 12/06/16 2206 12/07/16 0047 12/07/16 0637  BP: (!) 156/92 (!) 146/85 (!) 169/98 (!) 160/97  Pulse: 65 64 75 66  Resp: 18 20 18 18   Temp: 97.7 F (36.5 C) 97.5 F (36.4 C) 98 F (36.7 C) 97.7 F (36.5 C)  TempSrc: Oral Oral Oral Oral  SpO2: 100% 98% 95% 98%  Weight:    89.1 kg (196 lb 6.4 oz)  Height:       Weight change: 0.544 kg (1 lb 3.2 oz)  Intake/Output Summary (Last 24 hours) at 12/07/16 1027 Last data filed at 12/07/16 0900  Gross per 24 hour  Intake             1290 ml  Output             1750 ml  Net             -460 ml    Assessment/Plan: 51 year old black male with long-standing hypertension poorly controlled who has a history dating back to 2014 of chronic kidney disease but now with worsening numbers in the setting of malignant hypertension and mild cardiomyopathy 1.Renal- acute kidney injury versus progression of chronic kidney disease. Possible AKI in the setting of malignant hypertension /creatine use. However, I'm more concerned this is progressive chronic renal insufficiency from long-standing poorly controlled hypertension.  Urinalysis pretty bland, renal ultrasound shows medical renal disease, SPEP pending.  Fortunately he doesn't appear to have uremic symptoms so does not need dialysis but CKD is advanced- I have explained that to him. Hopefully, we can get his blood pressure under control and gain back a little bit of kidney function for him.  This will need to be followed closely as an outpatient.  He does not need any further inpatient renal workup - if plan is for discharge is OK I can see him as OP 2. Hypertension/volume  - poorly controlled and has not been compliant with medications also exacerbated by cocaine use.He needs a simple regimen that he can keep up with- will do  Lasix twice daily, continue Norvasc BID and now coreg started-  feel hydralazine might be too much for him at QID.   If his renal function stabilizes as an outpatient I would use an Ace or an ARB.  BP is better- difficult to get good control in  Inpatient setting- will need fine tuning as OP 3. Anemia  - hemoglobin lower than what I would expect.  iron stores slightly low 4. Dispo- renal will sign off- I will make follow up with me as OP    Hillel Card A    Labs: Basic Metabolic Panel:  Recent Labs Lab 12/05/16 0339 12/06/16 0558 12/07/16 0503  NA 138 137 138  K 3.5 3.4* 3.8  CL 101 100* 102  CO2 25 25 23   GLUCOSE 98 92 93  BUN 43* 40* 43*  CREATININE 4.26* 4.17* 4.19*  CALCIUM 8.4* 8.6* 8.7*  PHOS  --  4.8* 5.2*   Liver Function Tests:  Recent Labs Lab 12/04/16 0746 12/06/16 0558 12/07/16 0503  AST 159*  --   --   ALT 161*  --   --   ALKPHOS 107  --   --   BILITOT 0.5  --   --   PROT 6.7  --   --  ALBUMIN 3.4* 3.6 3.6   No results for input(s): LIPASE, AMYLASE in the last 168 hours. No results for input(s): AMMONIA in the last 168 hours. CBC:  Recent Labs Lab 12/04/16 0746  WBC 6.4  HGB 11.1*  HCT 33.4*  MCV 86.3  PLT 196   Cardiac Enzymes:  Recent Labs Lab 12/04/16 1154 12/04/16 1652 12/04/16 2251  TROPONINI 0.14* 0.11* 0.12*   CBG: No results for input(s): GLUCAP in the last 168 hours.  Iron Studies:   Recent Labs  12/06/16 0558  IRON 65  TIBC 284  FERRITIN 161   Studies/Results: US Renal  Result Date: 12/05/2016 CLINICAL DATA:  Chronic kidney disease. EXAM: RENAL / URINARY TRACT ULTRASOUND COMPLETE COMPARISON:  None. FINDINGS: Right Kidney: Length: 9.2 cm. Diffusely echogenic. 6 mm upper pole cyst. No hydronephrosis. Left Kidney: Length: 9.5 cm. Diffusely echogenic. 1.0 cm upper pole cyst. 4.7 cm mid to lower pole cyst containing 2 thin internal septations. No hydronephrosis. Bladder: Mildly to moderately enlarged prostate  gland protruding into the base of the urinary bladder. Otherwise, normal appearing bladder. IMPRESSION: 1. Diffusely echogenic kidneys, compatible with medical renal disease. 2. No hydronephrosis. 3. Mildly to moderately enlarged prostate gland. Electronically Signed   By: Claudie Revering M.D.   On: 12/05/2016 16:30   Nm Myocar Multi W/spect W/wall Motion / Ef  Result Date: 12/06/2016 CLINICAL DATA:  Shortness of breath.  Hypertension.  Smoker. EXAM: MYOCARDIAL IMAGING WITH SPECT (REST AND PHARMACOLOGIC-STRESS) GATED LEFT VENTRICULAR WALL MOTION STUDY LEFT VENTRICULAR EJECTION FRACTION TECHNIQUE: Standard myocardial SPECT imaging was performed after resting intravenous injection of 10 mCi Tc-55m tetrofosmin. Subsequently, intravenous infusion of Lexiscan was performed under the supervision of the Cardiology staff. At peak effect of the drug, 30 mCi Tc-33m tetrofosmin was injected intravenously and standard myocardial SPECT imaging was performed. Quantitative gated imaging was also performed to evaluate left ventricular wall motion, and estimate left ventricular ejection fraction. COMPARISON:  None. FINDINGS: Perfusion: A large fixed area of decreased myocardial activity is seen in the inferior and inferoapical walls of the left ventricle on both stress and rest imaging, consistent with myocardial infarct. No reversible myocardial perfusion defects are seen to suggest the presence of inducible ischemia. Wall Motion: Moderate left ventricular dilatation. Inferior and inferoapical wall hypokinesis. Left Ventricular Ejection Fraction: 28 % End diastolic volume 865 ml End systolic volume 784 ml IMPRESSION: 1. No reversible ischemia. Inferior and inferoapical wall myocardial infarct. 2. Inferior and inferoapical wall hypokinesis, with moderate left ventricular dilatation. 3. Left ventricular ejection fraction 29% 4. Non invasive risk stratification*: High *2012 Appropriate Use Criteria for Coronary Revascularization  Focused Update: J Am Coll Cardiol. 6962;95(2):841-324. [email or web address] Electronically Signed   By: Earle Gell M.D.   On: 12/06/2016 15:38   Medications: Infusions:   Scheduled Medications: . allopurinol  100 mg Oral Daily  . amLODipine  5 mg Oral BID  . carvedilol  3.125 mg Oral BID WC  . furosemide  40 mg Oral BID  . heparin  5,000 Units Subcutaneous Q8H  . hydrALAZINE  25 mg Oral Q6H  . sodium chloride flush  3 mL Intravenous Q12H    have reviewed scheduled and prn medications.  Physical Exam: General: alert, NAD Heart: RRR Lungs: mostly clear Abdomen: soft, non tender Extremities: no edema    12/07/2016,10:27 AM  LOS: 3 days       \

## 2016-12-07 NOTE — Progress Notes (Signed)
Pt refused bed alarm on, pt alert and oriented, educated on pt's safety plan. Will continue to do hourly rounding.

## 2016-12-07 NOTE — Discharge Summary (Addendum)
Physician Discharge Summary  Patient ID: Gabriel Ashley MRN: [id number] DOB/AGE: 12-15-1965 51 y.o.  Admit date: 12/04/2016 Discharge date: 12/07/2016  Admission Diagnoses: Acute systolic heart failure Uncontrolled hypertension CKD, IV Anemia  Discharge Diagnoses:  Principle Problem:   Acute congestive heart failure with left ventricular systolic and diastolic dysfunction (HCC) Active problems:   Hypertension, emergency   CKD, IV   Hyperuricemia   Secondary hyperparathyroidism   BPH   Anemia of chronic disease   Abnormal Troponin-I due to demand ischemia  Discharged Condition: fair  Hospital Course: 51 year old male had shortness of breath and wheezing. He had known history of hypertention and renal dysfunction but did not take medications for 2 years. He had elevated BNP and vascular congestion on chest X-ray. He responded to one dose of IV lasix followed by oral furosemide. He had renal consult. His iron level was normal. His uric acid level was high. His parathyroid level was also high. His blood pressure control improved with Amlodipine, Hydralazine, Carvedilol, Furosemide and Clonidine use. He understood need to follow heart healthy and renal diet. He will see me in 1 week and Dr. Moshe Cipro in 2 weeks or as arranged.  Consults: cardiology and nephrology  Significant Diagnostic Studies: labs: Normal electrolytes with elevated BUN of 38 and creatinine of 3.82, calcium 8.7 and phosphorus 4.8-5.2. Non-reactive HIV antibody test. Troponin-I of 0.12 to 0.14. BNP of 887.4. Normal PSA.  EKG: Sinus bradycardia with LVH and lateral ischemia.  Chest x-ray: CHF.  Renal US: Diffuse echogenic kidneys without hydronephrosis and with BPH.  Nuclear stress test: No reversible ischemia but dilated LV with inferior and apical wall infarct. EF 29 %.  Echocardiogram: LVH with mild systolic and diastolic dysfunction. Mild AI, Severe MR and moderate to severe TR.  Treatments: cardiac meds:  carvedilol, clonidine, amlodipine, furosemide and hydralazine.   Discharge Exam: Blood pressure (!) 160/97, pulse 66, temperature 97.7 F (36.5 C), temperature source Oral, resp. rate 18, height 6' (1.829 m), weight 89.1 kg (196 lb 6.4 oz), SpO2 98 %. General appearance: alert, cooperative and appears stated age Head: Normocephalic, atraumatic. Eyes: Brown eyes, pink conjunctivae/corneas clear. PERRL, EOM's intact.  Neck: no adenopathy, no carotid bruit, no JVD, supple, symmetrical, trachea midline and thyroid not enlarged. Resp: clear to auscultation bilaterally. Cardio: regular rate and rhythm, S1, S2 normal, II/VI systolic and diastolic murmur, no click, rub or gallop GI: soft, non-tender; bowel sounds normal; no masses,  no organomegaly Extremities: no cyanosis or edema Skin: Warm and dry. No rashes or lesions Neurologic: Alert and oriented X 3, normal strength and tone. Normal coordination and gait.  Disposition: 01-Home or Self Care   Allergies as of 12/07/2016   No Known Allergies     Medication List    STOP taking these medications   albuterol 108 (90 Base) MCG/ACT inhaler Commonly known as:  PROVENTIL HFA;VENTOLIN HFA   buPROPion 300 MG 24 hr tablet Commonly known as:  WELLBUTRIN XL   busPIRone 10 MG tablet Commonly known as:  BUSPAR   diclofenac sodium 1 % Gel Commonly known as:  VOLTAREN   QUEtiapine 100 MG tablet Commonly known as:  SEROQUEL   traZODone 50 MG tablet Commonly known as:  DESYREL     TAKE these medications   acetaminophen 325 MG tablet Commonly known as:  TYLENOL Take 650 mg by mouth every 6 (six) hours as needed for mild pain.   allopurinol 100 MG tablet Commonly known as:  ZYLOPRIM Take 1 tablet (100 mg  total) by mouth daily. Start taking on:  12/08/2016   amLODipine 10 MG tablet Commonly known as:  NORVASC Take 1 tablet (10 mg total) by mouth daily.   carvedilol 3.125 MG tablet Commonly known as:  COREG Take 1 tablet (3.125 mg  total) by mouth 2 (two) times daily with a meal.   cloNIDine 0.2 MG tablet Commonly known as:  CATAPRES Take 1 tablet (0.2 mg total) by mouth 2 (two) times daily. What changed:  medication strength  how much to take   furosemide 40 MG tablet Commonly known as:  LASIX Take 1 tablet (40 mg total) by mouth 2 (two) times daily.   hydrALAZINE 50 MG tablet Commonly known as:  APRESOLINE Take 0.5 tablets (25 mg total) by mouth 2 (two) times daily.   multivitamin tablet Take 1 tablet by mouth daily.      Follow-up Information    [facility] S, MD. Schedule an appointment as soon as possible for a visit in 1 week(s).   Specialty:  Cardiology Contact information: [address] [place] [address] [phone]        GOL[place],KELLIE A, MD. Schedule an appointment as soon as possible for a visit in 2 week(s).   Specialty:  Nephrology Contact information: [place] [address] [phone]           Signed: Birdie Riddle 12/07/2016, 10:38 AM

## 2016-12-07 NOTE — Progress Notes (Signed)
Patient to follow up at the [facility] for ongoing medical care and he will get his prescriptions filled there also [facility]); Mindi Slicker [facility] [phone]

## 2016-12-07 NOTE — Progress Notes (Signed)
As per instruction via Dr. Doylene Canard, gave pt his second catapres dose, and then ok to discharge.  Explained discharge instructions to him, and he had no further questions.  Removed IV without complication.  Notified CCMD of telemetry removal.  Pt waiting on friend's ride to home.

## 2017-04-20 ENCOUNTER — Inpatient Hospital Stay (HOSPITAL_COMMUNITY)
Admission: EM | Admit: 2017-04-20 | Discharge: 2017-04-25 | DRG: 885 | Disposition: A | Discharge status: Other Acute Inpatient Hospital | Payer: Non-veteran care | Attending: Family Medicine | Admitting: Family Medicine

## 2017-04-20 ENCOUNTER — Encounter (HOSPITAL_COMMUNITY): Payer: Self-pay | Admitting: *Deleted

## 2017-04-20 DIAGNOSIS — I5042 Chronic combined systolic (congestive) and diastolic (congestive) heart failure: Secondary | ICD-10-CM | POA: Diagnosis present

## 2017-04-20 DIAGNOSIS — Z9119 Patient's noncompliance with other medical treatment and regimen: Secondary | ICD-10-CM

## 2017-04-20 DIAGNOSIS — I160 Hypertensive urgency: Secondary | ICD-10-CM | POA: Diagnosis present

## 2017-04-20 DIAGNOSIS — N184 Chronic kidney disease, stage 4 (severe): Secondary | ICD-10-CM | POA: Diagnosis present

## 2017-04-20 DIAGNOSIS — G4733 Obstructive sleep apnea (adult) (pediatric): Secondary | ICD-10-CM | POA: Diagnosis present

## 2017-04-20 DIAGNOSIS — K5900 Constipation, unspecified: Secondary | ICD-10-CM | POA: Diagnosis present

## 2017-04-20 DIAGNOSIS — R45851 Suicidal ideations: Secondary | ICD-10-CM | POA: Diagnosis not present

## 2017-04-20 DIAGNOSIS — Z79899 Other long term (current) drug therapy: Secondary | ICD-10-CM

## 2017-04-20 DIAGNOSIS — F17200 Nicotine dependence, unspecified, uncomplicated: Secondary | ICD-10-CM | POA: Diagnosis present

## 2017-04-20 DIAGNOSIS — F329 Major depressive disorder, single episode, unspecified: Secondary | ICD-10-CM | POA: Diagnosis present

## 2017-04-20 DIAGNOSIS — I130 Hypertensive heart and chronic kidney disease with heart failure and stage 1 through stage 4 chronic kidney disease, or unspecified chronic kidney disease: Secondary | ICD-10-CM | POA: Diagnosis present

## 2017-04-20 DIAGNOSIS — F4310 Post-traumatic stress disorder, unspecified: Secondary | ICD-10-CM | POA: Diagnosis present

## 2017-04-20 DIAGNOSIS — F32A Depression, unspecified: Secondary | ICD-10-CM | POA: Diagnosis present

## 2017-04-20 DIAGNOSIS — N179 Acute kidney failure, unspecified: Secondary | ICD-10-CM | POA: Diagnosis present

## 2017-04-20 DIAGNOSIS — R001 Bradycardia, unspecified: Secondary | ICD-10-CM | POA: Diagnosis not present

## 2017-04-20 DIAGNOSIS — F419 Anxiety disorder, unspecified: Secondary | ICD-10-CM | POA: Diagnosis present

## 2017-04-20 DIAGNOSIS — F1420 Cocaine dependence, uncomplicated: Secondary | ICD-10-CM | POA: Diagnosis present

## 2017-04-20 DIAGNOSIS — F339 Major depressive disorder, recurrent, unspecified: Principal | ICD-10-CM | POA: Diagnosis present

## 2017-04-20 DIAGNOSIS — M19011 Primary osteoarthritis, right shoulder: Secondary | ICD-10-CM | POA: Diagnosis present

## 2017-04-20 HISTORY — DX: Hypertensive urgency: I16.0

## 2017-04-20 LAB — COMPREHENSIVE METABOLIC PANEL
ALT: 20 U/L (ref 17–63)
ANION GAP: 14 (ref 5–15)
AST: 27 U/L (ref 15–41)
Albumin: 4.7 g/dL (ref 3.5–5.0)
Alkaline Phosphatase: 105 U/L (ref 38–126)
BILIRUBIN TOTAL: 0.7 mg/dL (ref 0.3–1.2)
BUN: 56 mg/dL — ABNORMAL HIGH (ref 6–20)
CALCIUM: 9.1 mg/dL (ref 8.9–10.3)
CO2: 20 mmol/L — ABNORMAL LOW (ref 22–32)
Chloride: 108 mmol/L (ref 101–111)
Creatinine, Ser: 6.2 mg/dL — ABNORMAL HIGH (ref 0.61–1.24)
GFR calc Af Amer: 11 mL/min — ABNORMAL LOW (ref >60)
GFR, EST NON AFRICAN AMERICAN: 9 mL/min — AB (ref >60)
Glucose, Bld: 117 mg/dL — ABNORMAL HIGH (ref 65–99)
Potassium: 3.7 mmol/L (ref 3.5–5.1)
Sodium: 142 mmol/L (ref 135–145)
TOTAL PROTEIN: 8.9 g/dL — AB (ref 6.5–8.1)

## 2017-04-20 LAB — RAPID URINE DRUG SCREEN, HOSP PERFORMED
Amphetamines: NOT DETECTED
Barbiturates: NOT DETECTED
Benzodiazepines: NOT DETECTED
COCAINE: POSITIVE — AB
OPIATES: NOT DETECTED
Tetrahydrocannabinol: NOT DETECTED

## 2017-04-20 LAB — ETHANOL

## 2017-04-20 LAB — CBC
HCT: 36.6 % — ABNORMAL LOW (ref 39.0–52.0)
Hemoglobin: 12.5 g/dL — ABNORMAL LOW (ref 13.0–17.0)
MCH: 28.4 pg (ref 26.0–34.0)
MCHC: 34.2 g/dL (ref 30.0–36.0)
MCV: 83.2 fL (ref 78.0–100.0)
PLATELETS: 203 10*3/uL (ref 150–400)
RBC: 4.4 MIL/uL (ref 4.22–5.81)
RDW: 15.8 % — ABNORMAL HIGH (ref 11.5–15.5)
WBC: 8.5 10*3/uL (ref 4.0–10.5)

## 2017-04-20 MED ORDER — HYDRALAZINE HCL 25 MG PO TABS
25.0000 mg | ORAL_TABLET | Freq: Two times a day (BID) | ORAL | Status: DC
  Administered 2017-04-20 – 2017-04-21 (×3): 25 mg via ORAL
  Filled 2017-04-20 (×3): qty 1

## 2017-04-20 MED ORDER — HYDROXYZINE HCL 25 MG PO TABS
25.0000 mg | ORAL_TABLET | Freq: Once | ORAL | Status: AC
  Administered 2017-04-20: 25 mg via ORAL
  Filled 2017-04-20: qty 1

## 2017-04-20 MED ORDER — ADULT MULTIVITAMIN W/MINERALS CH
1.0000 | ORAL_TABLET | Freq: Every day | ORAL | Status: DC
  Administered 2017-04-20 – 2017-04-25 (×6): 1 via ORAL
  Filled 2017-04-20 (×6): qty 1

## 2017-04-20 MED ORDER — CLONIDINE HCL 0.2 MG PO TABS
0.2000 mg | ORAL_TABLET | Freq: Two times a day (BID) | ORAL | Status: DC
  Administered 2017-04-20 – 2017-04-25 (×10): 0.2 mg via ORAL
  Filled 2017-04-20 (×10): qty 1

## 2017-04-20 MED ORDER — FOLIC ACID 1 MG PO TABS
1.0000 mg | ORAL_TABLET | Freq: Every day | ORAL | Status: DC
  Administered 2017-04-20 – 2017-04-25 (×6): 1 mg via ORAL
  Filled 2017-04-20 (×6): qty 1

## 2017-04-20 MED ORDER — BUPROPION HCL 75 MG PO TABS
75.0000 mg | ORAL_TABLET | Freq: Every day | ORAL | Status: DC
  Administered 2017-04-20 – 2017-04-25 (×6): 75 mg via ORAL
  Filled 2017-04-20 (×6): qty 1

## 2017-04-20 MED ORDER — VITAMIN B-1 100 MG PO TABS
100.0000 mg | ORAL_TABLET | Freq: Every day | ORAL | Status: DC
  Administered 2017-04-20 – 2017-04-25 (×6): 100 mg via ORAL
  Filled 2017-04-20 (×6): qty 1

## 2017-04-20 MED ORDER — ACETAMINOPHEN 650 MG RE SUPP: 650.0000 mg | Freq: Four times a day (QID) | RECTAL | Status: DC | PRN

## 2017-04-20 MED ORDER — ALBUTEROL SULFATE (2.5 MG/3ML) 0.083% IN NEBU: 3.0000 mL | INHALATION_SOLUTION | Freq: Four times a day (QID) | RESPIRATORY_TRACT | Status: DC | PRN

## 2017-04-20 MED ORDER — AMLODIPINE BESYLATE 10 MG PO TABS
10.0000 mg | ORAL_TABLET | Freq: Every day | ORAL | Status: DC
  Administered 2017-04-20 – 2017-04-25 (×6): 10 mg via ORAL
  Filled 2017-04-20 (×3): qty 1
  Filled 2017-04-20: qty 2
  Filled 2017-04-20 (×2): qty 1

## 2017-04-20 MED ORDER — CARVEDILOL 3.125 MG PO TABS
3.1250 mg | ORAL_TABLET | Freq: Two times a day (BID) | ORAL | Status: DC
  Administered 2017-04-20: 3.125 mg via ORAL
  Filled 2017-04-20 (×3): qty 1

## 2017-04-20 MED ORDER — ACETAMINOPHEN 325 MG PO TABS
650.0000 mg | ORAL_TABLET | Freq: Four times a day (QID) | ORAL | Status: DC | PRN
  Administered 2017-04-22 – 2017-04-24 (×6): 650 mg via ORAL
  Filled 2017-04-20 (×6): qty 2

## 2017-04-20 MED ORDER — FUROSEMIDE 40 MG PO TABS
40.0000 mg | ORAL_TABLET | Freq: Two times a day (BID) | ORAL | Status: DC
  Administered 2017-04-20 – 2017-04-21 (×2): 40 mg via ORAL
  Filled 2017-04-20 (×2): qty 1

## 2017-04-20 MED ORDER — AMLODIPINE BESYLATE 10 MG PO TABS: 10.0000 mg | ORAL_TABLET | Freq: Every day | ORAL | Status: DC

## 2017-04-20 MED ORDER — SODIUM CHLORIDE 0.45 % IV SOLN
INTRAVENOUS | Status: DC
  Administered 2017-04-20: 18:00:00 via INTRAVENOUS

## 2017-04-20 MED ORDER — BUSPIRONE HCL 5 MG PO TABS
10.0000 mg | ORAL_TABLET | Freq: Two times a day (BID) | ORAL | Status: DC
  Administered 2017-04-20 – 2017-04-21 (×2): 10 mg via ORAL
  Filled 2017-04-20 (×2): qty 2

## 2017-04-20 MED ORDER — CARVEDILOL 3.125 MG PO TABS
3.1250 mg | ORAL_TABLET | Freq: Two times a day (BID) | ORAL | Status: DC
Start: 2017-04-20 — End: 2017-04-20

## 2017-04-20 MED ORDER — ASPIRIN EC 81 MG PO TBEC
81.0000 mg | DELAYED_RELEASE_TABLET | Freq: Every day | ORAL | Status: DC
  Administered 2017-04-20 – 2017-04-25 (×6): 81 mg via ORAL
  Filled 2017-04-20 (×6): qty 1

## 2017-04-20 NOTE — ED Provider Notes (Signed)
[facility] DEPT Provider Note   CSN: [id number] Arrival date & time: 04/20/17  1103     History   Chief Complaint Chief Complaint  Patient presents with  . Suicidal    HPI Gabriel Ashley is a 52 y.o. male.  The history is provided by the patient and medical records. No language interpreter was used.   Gabriel Ashley is a 51 y.o. male  with a PMH of HTN, CKD, CHF who presents to the Emergency Department complaining of suicidal thoughts for the last two days. Patient states that he started doing crack cocaine again two days ago and since then his mood has been "in the toilet". He has been feeling hopeless and thinking about hurting himself. No HI. No prior suicidal attempts. No other drug use. Denies ETOH use. No other complaints at this time. Takes BP medication, but has not been taking over the last two days.    Past Medical History:  Diagnosis Date  . Anxiety   . Asthma   . Hypertension   . Post traumatic stress disorder (PTSD)     Patient Active Problem List   Diagnosis Date Noted  . Acute renal failure (ARF) ([place]) 04/20/2017  . Acute congestive heart failure with left ventricular diastolic dysfunction ([place]) 12/04/2016  . MDD (major depressive disorder), recurrent severe, without psychosis ([place]) 02/13/2015  . Essential hypertension   . Substance abuse 10/26/2014  . Substance induced mood disorder ([place])   . Major depressive disorder, recurrent, severe without psychotic features ([place])   . Severe major depression ([place]) 10/23/2014  . PTSD (post-traumatic stress disorder)   . Essential hypertension, benign 05/11/2014  . Elevated serum creatinine 05/08/2014  . Depressive disorder 05/07/2014  . Suicidal ideation 05/07/2014  . Homicidal ideation 05/07/2014  . Anxiety 05/07/2014  . Severe recurrent major depression without psychotic features ([place]) 05/07/2014    No past surgical history on file.     Home Medications    Prior to Admission medications   Medication Sig  Start Date End Date Taking? Authorizing Provider  amLODipine (NORVASC) 10 MG tablet Take 1 tablet (10 mg total) by mouth daily. 12/07/16  Yes Dixie Dials, MD  cloNIDine (CATAPRES) 0.2 MG tablet Take 1 tablet (0.2 mg total) by mouth 2 (two) times daily. 12/07/16  Yes Dixie Dials, MD  hydrALAZINE (APRESOLINE) 50 MG tablet Take 0.5 tablets (25 mg total) by mouth 2 (two) times daily. 12/07/16  Yes Dixie Dials, MD  Multiple Vitamin (MULTIVITAMIN) tablet Take 1 tablet by mouth daily.   Yes [provider]  acetaminophen (TYLENOL) 325 MG tablet Take 650 mg by mouth every 6 (six) hours as needed for mild pain.    [provider]  allopurinol (ZYLOPRIM) 100 MG tablet Take 1 tablet (100 mg total) by mouth daily. 12/08/16   Dixie Dials, MD  carvedilol (COREG) 3.125 MG tablet Take 1 tablet (3.125 mg total) by mouth 2 (two) times daily with a meal. 12/07/16   Dixie Dials, MD  furosemide (LASIX) 40 MG tablet Take 1 tablet (40 mg total) by mouth 2 (two) times daily. 12/07/16   Dixie Dials, MD    Family History No family history on file.  Social History Social History  Substance Use Topics  . Smoking status: Current Every Day Smoker    Packs/day: 1.00    Years: 31.00  . Smokeless tobacco: Never Used  . Alcohol use Yes     Comment: 2 40 oz beers last night     Allergies  Patient has no known allergies.   Review of Systems Review of Systems  Psychiatric/Behavioral: Positive for suicidal ideas.  All other systems reviewed and are negative.    Physical Exam Updated Vital Signs BP (!) 183/114 (BP Location: Right Arm)   Pulse 79   Temp 99 F (37.2 C) (Oral)   Resp 18   SpO2 100%   Physical Exam  Constitutional: He is oriented to person, place, and time. He appears well-developed and well-nourished. No distress.  HENT:  Head: Normocephalic and atraumatic.  Cardiovascular: Normal rate, regular rhythm and normal heart sounds.   No murmur heard. Pulmonary/Chest:  Effort normal and breath sounds normal. No respiratory distress.  Abdominal: Soft. He exhibits no distension. There is no tenderness.  Musculoskeletal: Normal range of motion.  Neurological: He is alert and oriented to person, place, and time.  Skin: Skin is warm and dry.  Nursing note and vitals reviewed.    ED Treatments / Results  Labs (all labs ordered are listed, but only abnormal results are displayed) Labs Reviewed  COMPREHENSIVE METABOLIC PANEL - Abnormal; Notable for the following:       Result Value   CO2 20 (*)    Glucose, Bld 117 (*)    BUN 56 (*)    Creatinine, Ser 6.20 (*)    Total Protein 8.9 (*)    GFR calc non Af Amer 9 (*)    GFR calc Af Amer 11 (*)    All other components within normal limits  CBC - Abnormal; Notable for the following:    Hemoglobin 12.5 (*)    HCT 36.6 (*)    RDW 15.8 (*)    All other components within normal limits  RAPID URINE DRUG SCREEN, HOSP PERFORMED - Abnormal; Notable for the following:    Cocaine POSITIVE (*)    All other components within normal limits  ETHANOL    EKG  EKG Interpretation None       Radiology No results found.  Procedures Procedures (including critical care time)  Medications Ordered in ED Medications  amLODipine (NORVASC) tablet 10 mg (10 mg Oral Given 04/20/17 1244)  carvedilol (COREG) tablet 3.125 mg (3.125 mg Oral Given 04/20/17 1326)  hydrOXYzine (ATARAX/VISTARIL) tablet 25 mg (25 mg Oral Given 04/20/17 1217)     Initial Impression / Assessment and Plan / ED Course  I have reviewed the triage vital signs and the nursing notes.  Pertinent labs & imaging results that were available during my care of the patient were reviewed by me and considered in my medical decision making (see chart for details).    Gabriel Ashley is a 51 y.o. male who presents to ED for suicidal thoughts after starting to use crack cocaine again 2 days ago. Patient with no physical complaints. Medical clearance labs reviewed.  Patient has hx of CKD with baseline creatinine of 4. Today creatinine of 6.20. Also is hypertensive 176/109. History of CHF with EF earlier this year of 40-45%. Do not feel that patient is medically cleared given history and labs. Case discussed with attending, Dr. Kathrynn Humble, who agrees and recommends admission. TTS consulted to evaluate patient. Hospitalist consulted who will admit.   Final Clinical Impressions(s) / ED Diagnoses   Final diagnoses:  Suicidal thoughts  Acute renal failure, unspecified acute renal failure type [facility])    New Prescriptions New Prescriptions   No medications on file     Ward, Ozella Almond, PA-C 04/20/17 [place], [place], MD 04/20/17 1757

## 2017-04-20 NOTE — BH Assessment (Signed)
[facility] Assessment Progress Note   Case was staffed with Lord DNP who recommended patient be re-evaluated in the a.m.    

## 2017-04-20 NOTE — ED Triage Notes (Signed)
Patient from home reports a general feeling of being suicidal with no particular plan.  He reports he thinks this feeling is connected to his recent use of crack cocaine.  Patient denies HI, or AVH.  Calm and cooperative at this time.
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
## 2017-04-20 NOTE — ED Notes (Addendum)
Report called to [facility].  Dr. Jamse Arn paged for patient's continued high blood pressure.  No further orders given.

## 2017-04-20 NOTE — ED Notes (Signed)
Patient talking with Counselor Waunita Schooner.

## 2017-04-20 NOTE — BH Assessment (Deleted)
[place] Assessment Progress Note  Per Corena Pilgrim, MD, this pt requires psychiatric hospitalization.  Leonia Reader, RN, [facility] reports that pt has been accepted to [facility] by Dr Jerilee Hoh to Rm 304; they will be ready to receive pt after 19:30 or 20:00.  Pt presents under IVC initiated by his roommate, and upheld by Dr Darleene Cleaver, and IVC documents have been faxed to [phone].  Pt's nurse, Caren Griffins, has been notified, and agrees to call report to [phone].  Pt is to be transported via [facility].   Jalene Mullet, [place] Triage Specialist [phone]

## 2017-04-20 NOTE — H&P (Signed)
History and Physical    Gabriel Ashley KGM:[id number] DOB: November 14, 1965 DOA: 04/20/2017  PCP: System, Provider Not In  Patient coming from: Home  I have personally briefly reviewed patient's old medical records in [place]  Chief Complaint:   HPI: [name] is a 51 y.o. male with medical history significant of uncontrolled hypertension, substance abuse, chronic renal failure secondary to hypertension and asthma who presented to the emergency room with complaints of suicidal ideation after restarting use of cocaine 2 days ago. Patient has no physical concerns. He is seeking help primarily for his recurrent depression. He notes he has been generally taking his antidepressants however does not like the way the Seroquel makes him feel. He he notes he hasn't taken his medications for the past 2 days however had been taking them previously as prescribed.  ED Course:  In the emergency room patient was noted to be markedly hypertensive with blood pressure of 190/111. Patient denied any chest pain, shortness of breath or headache with this. Patient was also noted to have an elevation of his baseline creatinine from 4 up to 6 today. Patient denies any change in his urine output. He was treated with reinitiation of his blood pressure medications and hydration.  Review of Systems: As per HPI otherwise 10 point review of systems negative.   Past Medical History:  Diagnosis Date  . Anxiety   . Asthma   . Hypertension   . Post traumatic stress disorder (PTSD)     History reviewed. No pertinent surgical history.   reports that he has been smoking.  He has a 31.00 pack-year smoking history. He has never used smokeless tobacco. He reports that he drinks alcohol. He reports that he uses drugs, including Cocaine and Marijuana.  No Known Allergies  History reviewed. No pertinent family history.    Prior to Admission medications   Medication Sig Start Date End Date Taking? Authorizing Provider    albuterol (PROVENTIL HFA;VENTOLIN HFA) 108 (90 Base) MCG/ACT inhaler Inhale 2 puffs into the lungs every 6 (six) hours as needed for wheezing or shortness of breath.   Yes [provider]  amLODipine (NORVASC) 10 MG tablet Take 1 tablet (10 mg total) by mouth daily. 12/07/16  Yes Dixie Dials, MD  buPROPion (WELLBUTRIN) 75 MG tablet Take 75 mg by mouth daily.   Yes [provider]  busPIRone (BUSPAR) 10 MG tablet Take 10 mg by mouth 2 (two) times daily.   Yes [provider]  carvedilol (COREG) 3.125 MG tablet Take 1 tablet (3.125 mg total) by mouth 2 (two) times daily with a meal. 12/07/16  Yes Dixie Dials, MD  cloNIDine (CATAPRES) 0.2 MG tablet Take 1 tablet (0.2 mg total) by mouth 2 (two) times daily. 12/07/16  Yes Dixie Dials, MD  furosemide (LASIX) 40 MG tablet Take 1 tablet (40 mg total) by mouth 2 (two) times daily. 12/07/16  Yes Dixie Dials, MD  hydrALAZINE (APRESOLINE) 50 MG tablet Take 0.5 tablets (25 mg total) by mouth 2 (two) times daily. 12/07/16  Yes Dixie Dials, MD  Multiple Vitamin (MULTIVITAMIN) tablet Take 1 tablet by mouth daily.   Yes [provider]  QUEtiapine (SEROQUEL) 100 MG tablet Take 50 mg by mouth at bedtime.   Yes [provider]  allopurinol (ZYLOPRIM) 100 MG tablet Take 1 tablet (100 mg total) by mouth daily. Patient not taking: Reported on 04/20/2017 12/08/16   Dixie Dials, MD    Physical Exam: Vitals:   04/20/17 1129 04/20/17 1333  04/20/17 1441 04/20/17 1510  BP: (!) 176/109 (!) 183/114 (!) 190/111 (!) 172/103  Pulse: 95 79 80 67  Resp: 18  20 19   Temp: 99 F (37.2 C)   98.2 F (36.8 C)  TempSrc: Oral   Oral  SpO2: 100% 100% 100% 100%  Weight:    91.3 kg (201 lb 4.5 oz)  Height:    6' (1.829 m)    Constitutional: NAD, calm, comfortable Vitals:   04/20/17 1129 04/20/17 1333 04/20/17 1441 04/20/17 1510  BP: (!) 176/109 (!) 183/114 (!) 190/111 (!) 172/103  Pulse: 95 79 80 67  Resp: 18  20 19   Temp: 99  F (37.2 C)   98.2 F (36.8 C)  TempSrc: Oral   Oral  SpO2: 100% 100% 100% 100%  Weight:    91.3 kg (201 lb 4.5 oz)  Height:    6' (1.829 m)   Eyes: PERRL, lids and conjunctivae normal ENMT: Mucous membranes are moist. Posterior pharynx clear of any exudate or lesions.Normal dentition.  Neck: normal, supple, no masses, no thyromegaly Respiratory: clear to auscultation bilaterally, no wheezing, no crackles. Normal respiratory effort. No accessory muscle use.  Cardiovascular: Regular rate and rhythm, no murmurs / rubs / gallops. No extremity edema. 2+ pedal pulses. No carotid bruits.  Abdomen: no tenderness, no masses palpated. No hepatosplenomegaly. Bowel sounds positive.  Musculoskeletal: no clubbing / cyanosis. No joint deformity upper and lower extremities. Good ROM, no contractures. Normal muscle tone.  Skin: no rashes, lesions, ulcers. No induration Neurologic: CN 2-12 grossly intact. Sensation intact, DTR normal. Strength 5/5 in all 4.  Psychiatric: Patient seems to be in good spirits. He was open and communicative and appropriate. He expressed interest in feeling better. He appeared to have good judgment at this moment.  Labs on Admission: I have personally reviewed following labs and imaging studies  CBC:  Recent Labs Lab 04/20/17 1136  WBC 8.5  HGB 12.5*  HCT 36.6*  MCV 83.2  PLT 539   Basic Metabolic Panel:  Recent Labs Lab 04/20/17 1136  NA 142  K 3.7  CL 108  CO2 20*  GLUCOSE 117*  BUN 56*  CREATININE 6.20*  CALCIUM 9.1   GFR: Estimated Creatinine Clearance: 15.3 mL/min (A) (by C-G formula based on SCr of 6.2 mg/dL (H)). Liver Function Tests:  Recent Labs Lab 04/20/17 1136  AST 27  ALT 20  ALKPHOS 105  BILITOT 0.7  PROT 8.9*  ALBUMIN 4.7   No results for input(s): LIPASE, AMYLASE in the last 168 hours. No results for input(s): AMMONIA in the last 168 hours. Coagulation Profile: No results for input(s): INR, PROTIME in the last 168  hours. Cardiac Enzymes: No results for input(s): CKTOTAL, CKMB, CKMBINDEX, TROPONINI in the last 168 hours. BNP (last 3 results) No results for input(s): PROBNP in the last 8760 hours. HbA1C: No results for input(s): HGBA1C in the last 72 hours. CBG: No results for input(s): GLUCAP in the last 168 hours. Lipid Profile: No results for input(s): CHOL, HDL, LDLCALC, TRIG, CHOLHDL, LDLDIRECT in the last 72 hours. Thyroid Function Tests: No results for input(s): TSH, T4TOTAL, FREET4, T3FREE, THYROIDAB in the last 72 hours. Anemia Panel: No results for input(s): VITAMINB12, FOLATE, FERRITIN, TIBC, IRON, RETICCTPCT in the last 72 hours. Urine analysis:    Component Value Date/Time   COLORURINE STRAW (A) 12/05/2016 1855   APPEARANCEUR CLEAR 12/05/2016 1855   LABSPEC 1.006 12/05/2016 1855   PHURINE 6.0 12/05/2016 1855   GLUCOSEU NEGATIVE 12/05/2016 1855  HGBUR SMALL (A) 12/05/2016 1855   BILIRUBINUR NEGATIVE 12/05/2016 1855   KETONESUR NEGATIVE 12/05/2016 1855   PROTEINUR 30 (A) 12/05/2016 1855   UROBILINOGEN 0.2 10/22/2014 0759   NITRITE NEGATIVE 12/05/2016 1855   LEUKOCYTESUR NEGATIVE 12/05/2016 1855    Radiological Exams on Admission: No results found.   Assessment/Plan Active Problems:   Acute renal failure (ARF) (HCC) Likely secondary to uncontrolled hypertension times many years. Will attempt to improve with some hydration overnight. Patient will likely progress to hemodialysis in the future.    Hypertensive urgency Blood pressure much improved with reinitiation of his blood pressure medications. I suspect as the cocaine comes out of his system his pressure would improve even more. He is asymptomatic from his elevated blood pressures.    Depressive disorder Patient states he will not take his Seroquel but I have really started his buspirone and bupropion. Further management per psychiatry.    Suicidal ideation Patient has sitter at bedside. Psychiatry consultation has  been called from the emergency room.  Elevated total protein Will repeat in the morning. May need workup for a blood cell dyscrasia with hemoglobin electrophoresis if continues to be markedly elevated.   DVT prophylaxis: Ambulatory young patient is not at high risk for DVT Code Status: Full Family Communication: Patient declined my offer to communicate with his family  Disposition Plan: Disposition will be per psychiatric recommendations Consults called: Psychiatry called in the emergency room Admission status: In patient with sitter   Vashti Hey MD [facility] Pager [phone]  If 7PM-7AM, please contact night-coverage [email or web address] Password [facility]  04/20/2017, 6:54 PM

## 2017-04-20 NOTE — ED Notes (Signed)
Please call Joelene Millin at [phone] at [phone] to give report.  thanks

## 2017-04-20 NOTE — Care Management Note (Signed)
Case Management Note  Patient Details  Name: [name] MRN: [id number] Date of Birth: 1965-12-26  Subjective/Objective:   51 y.o. male  with a PMH of HTN, CKD, CHF who presents to the Emergency Department complaining of suicidal thoughts for the last two days.  CM noted [facility] insurance.  Initial complaint of SI with substance abuse.              Action/Plan: Place CM order for [facility] follow up and CSW order for SI/Substance abuse complaint.  Will need to follow on inpt unit.  Expected Discharge Date:    Unknown              Expected Discharge Plan:  Home/Self Care  In-House Referral:  Clinical Social Work  Discharge planning Services  CM Consult  Status of Service:  In process, will continue to follow  Corky Crafts, RN 04/20/2017, 2:09 PM

## 2017-04-20 NOTE — ED Notes (Signed)
Attempted to call report to 5th floor.  Secretary said the [facility] is putting a hold on that patient and nurse will call when they are ready for patient.

## 2017-04-20 NOTE — Progress Notes (Signed)
Patient in room, oriented to unit, personal belongings bag given to NT on unit to secure, VS obtained, sitter at bedside

## 2017-04-21 DIAGNOSIS — F17200 Nicotine dependence, unspecified, uncomplicated: Secondary | ICD-10-CM | POA: Diagnosis present

## 2017-04-21 DIAGNOSIS — F4310 Post-traumatic stress disorder, unspecified: Secondary | ICD-10-CM | POA: Diagnosis present

## 2017-04-21 DIAGNOSIS — R45851 Suicidal ideations: Secondary | ICD-10-CM | POA: Diagnosis present

## 2017-04-21 DIAGNOSIS — I130 Hypertensive heart and chronic kidney disease with heart failure and stage 1 through stage 4 chronic kidney disease, or unspecified chronic kidney disease: Secondary | ICD-10-CM | POA: Diagnosis present

## 2017-04-21 DIAGNOSIS — N179 Acute kidney failure, unspecified: Secondary | ICD-10-CM

## 2017-04-21 DIAGNOSIS — G4733 Obstructive sleep apnea (adult) (pediatric): Secondary | ICD-10-CM | POA: Diagnosis present

## 2017-04-21 DIAGNOSIS — F17210 Nicotine dependence, cigarettes, uncomplicated: Secondary | ICD-10-CM | POA: Diagnosis not present

## 2017-04-21 DIAGNOSIS — F419 Anxiety disorder, unspecified: Secondary | ICD-10-CM | POA: Diagnosis present

## 2017-04-21 DIAGNOSIS — I160 Hypertensive urgency: Secondary | ICD-10-CM | POA: Diagnosis present

## 2017-04-21 DIAGNOSIS — F1490 Cocaine use, unspecified, uncomplicated: Secondary | ICD-10-CM | POA: Diagnosis not present

## 2017-04-21 DIAGNOSIS — F339 Major depressive disorder, recurrent, unspecified: Secondary | ICD-10-CM | POA: Diagnosis present

## 2017-04-21 DIAGNOSIS — N184 Chronic kidney disease, stage 4 (severe): Secondary | ICD-10-CM | POA: Diagnosis present

## 2017-04-21 DIAGNOSIS — I5042 Chronic combined systolic (congestive) and diastolic (congestive) heart failure: Secondary | ICD-10-CM | POA: Diagnosis present

## 2017-04-21 DIAGNOSIS — R001 Bradycardia, unspecified: Secondary | ICD-10-CM | POA: Diagnosis not present

## 2017-04-21 DIAGNOSIS — F1424 Cocaine dependence with cocaine-induced mood disorder: Secondary | ICD-10-CM | POA: Diagnosis not present

## 2017-04-21 DIAGNOSIS — K5900 Constipation, unspecified: Secondary | ICD-10-CM | POA: Diagnosis present

## 2017-04-21 DIAGNOSIS — N170 Acute kidney failure with tubular necrosis: Secondary | ICD-10-CM | POA: Diagnosis not present

## 2017-04-21 DIAGNOSIS — Z9119 Patient's noncompliance with other medical treatment and regimen: Secondary | ICD-10-CM | POA: Diagnosis not present

## 2017-04-21 DIAGNOSIS — F1290 Cannabis use, unspecified, uncomplicated: Secondary | ICD-10-CM

## 2017-04-21 DIAGNOSIS — M19011 Primary osteoarthritis, right shoulder: Secondary | ICD-10-CM | POA: Diagnosis present

## 2017-04-21 DIAGNOSIS — F1420 Cocaine dependence, uncomplicated: Secondary | ICD-10-CM | POA: Diagnosis present

## 2017-04-21 DIAGNOSIS — Z79899 Other long term (current) drug therapy: Secondary | ICD-10-CM | POA: Diagnosis not present

## 2017-04-21 LAB — COMPREHENSIVE METABOLIC PANEL
ALBUMIN: 4 g/dL (ref 3.5–5.0)
ALK PHOS: 86 U/L (ref 38–126)
ALT: 16 U/L — AB (ref 17–63)
ANION GAP: 11 (ref 5–15)
AST: 24 U/L (ref 15–41)
BUN: 48 mg/dL — ABNORMAL HIGH (ref 6–20)
CHLORIDE: 104 mmol/L (ref 101–111)
CO2: 24 mmol/L (ref 22–32)
Calcium: 8.6 mg/dL — ABNORMAL LOW (ref 8.9–10.3)
Creatinine, Ser: 4.88 mg/dL — ABNORMAL HIGH (ref 0.61–1.24)
GFR calc non Af Amer: 12 mL/min — ABNORMAL LOW (ref >60)
GFR, EST AFRICAN AMERICAN: 14 mL/min — AB (ref >60)
GLUCOSE: 106 mg/dL — AB (ref 65–99)
Potassium: 3.7 mmol/L (ref 3.5–5.1)
SODIUM: 139 mmol/L (ref 135–145)
Total Bilirubin: 0.5 mg/dL (ref 0.3–1.2)
Total Protein: 7.5 g/dL (ref 6.5–8.1)

## 2017-04-21 MED ORDER — HEPARIN SODIUM (PORCINE) 5000 UNIT/ML IJ SOLN
5000.0000 [IU] | Freq: Three times a day (TID) | INTRAMUSCULAR | Status: DC
  Administered 2017-04-21: 5000 [IU] via SUBCUTANEOUS
  Filled 2017-04-21 (×5): qty 1

## 2017-04-21 MED ORDER — SODIUM CHLORIDE 0.9 % IV SOLN: INTRAVENOUS | Status: DC

## 2017-04-21 MED ORDER — GABAPENTIN 100 MG PO CAPS
200.0000 mg | ORAL_CAPSULE | Freq: Two times a day (BID) | ORAL | Status: DC
  Administered 2017-04-21 – 2017-04-25 (×9): 200 mg via ORAL
  Filled 2017-04-21 (×9): qty 2

## 2017-04-21 NOTE — Progress Notes (Addendum)
PROGRESS NOTE    Gabriel Ashley  VWU:[id number] DOB: 1966/02/01 DOA: 04/20/2017 PCP: System, Provider Not In     Brief Narrative:  Gabriel Ashley is a 51 y.o. male with medical history significant of uncontrolled hypertension, substance abuse, chronic renal failure secondary to hypertension, CHF, and asthma who presented to the emergency room with complaints of suicidal ideation. He states that he was supposed to return to [place] yesterday, which has exacerbated his stress level. He started using cocaine 2 days ago, has had suicidal ideation. He states that about a month ago, he has sought care for his recurrent depression.   Assessment & Plan:   Active Problems:   Depressive disorder   Suicidal ideation   Acute renal failure (ARF) (HCC)   Hypertensive urgency   Suicidal ideation with depression -Psych consulted -Sitter at bedside, suicide precautions -Continue home wellbutrin, buspar. Refusing seroquel   AKI on CKD 4 -Secondary to uncontrolled HTN  -Baseline Cr 4.2  -Cr 6.2 --> 4.88 with supportive care. Continue to trend  -Hold lasix for now   Chronic systolic and diastolic heart failure -Echo 12/05/2016 showed EF 29%, grade 1 diastolic dysfunction -Hold lasix in setting of AKI, will also stop IVF and observe. Currently euvolemic   Hypertensive urgency -Secondary to medical noncompliance, cocaine use -Improved this morning  -Continue norvasc, catapres, hydralazine  -Holding coreg due to bradycardia    DVT prophylaxis: subq hep Code Status: Full Family Communication: no family at bedside  Disposition Plan: pending psych consultation   Consultants:   Psych  Procedures:   None  Antimicrobials:  Anti-infectives    None       Subjective: Patient evaluated at bedside. States that he has been under stress regarding his travel back to [place]. He started using cocaine 2 days ago and admits to suicidal ideation. No other physical complaints, denies any  chest pain or shortness of breath, nausea, vomiting, diarrhea or abdominal pain. Tolerating diet.   Objective: Vitals:   04/20/17 1510 04/20/17 2252 04/21/17 0648 04/21/17 0959  BP: (!) 172/103 (!) 158/88 (!) 146/86 127/78  Pulse: 67 62 (!) 56 (!) 57  Resp: 19 18 18    Temp: 98.2 F (36.8 C) 97.6 F (36.4 C) 98.1 F (36.7 C)   TempSrc: Oral Oral Oral   SpO2: 100% 100% 99%   Weight: 91.3 kg (201 lb 4.5 oz)     Height: 6' (1.829 m)       Intake/Output Summary (Last 24 hours) at 04/21/17 1146 Last data filed at 04/21/17 1000  Gross per 24 hour  Intake             2725 ml  Output                0 ml  Net             2725 ml   Filed Weights   04/20/17 1510  Weight: 91.3 kg (201 lb 4.5 oz)    Examination:  General exam: Appears calm and comfortable  Respiratory system: Clear to auscultation. Respiratory effort normal. Cardiovascular system: S1 & S2 heard, RRR. No JVD, murmurs, rubs, gallops or clicks. No pedal edema. Gastrointestinal system: Abdomen is nondistended, soft and nontender. No organomegaly or masses felt. Normal bowel sounds heard. Central nervous system: Alert and oriented. No focal neurological deficits. Extremities: Symmetric 5 x 5 power. Skin: No rashes, lesions or ulcers Psychiatry: Mood & affect flat.   Data Reviewed: I have personally reviewed following labs and imaging studies  CBC:  Recent Labs Lab 04/20/17 1136  WBC 8.5  HGB 12.5*  HCT 36.6*  MCV 83.2  PLT 211   Basic Metabolic Panel:  Recent Labs Lab 04/20/17 1136 04/21/17 0427  NA 142 139  K 3.7 3.7  CL 108 104  CO2 20* 24  GLUCOSE 117* 106*  BUN 56* 48*  CREATININE 6.20* 4.88*  CALCIUM 9.1 8.6*   GFR: Estimated Creatinine Clearance: 19.4 mL/min (A) (by C-G formula based on SCr of 4.88 mg/dL (H)). Liver Function Tests:  Recent Labs Lab 04/20/17 1136 04/21/17 0427  AST 27 24  ALT 20 16*  ALKPHOS 105 86  BILITOT 0.7 0.5  PROT 8.9* 7.5  ALBUMIN 4.7 4.0   No results for  input(s): LIPASE, AMYLASE in the last 168 hours. No results for input(s): AMMONIA in the last 168 hours. Coagulation Profile: No results for input(s): INR, PROTIME in the last 168 hours. Cardiac Enzymes: No results for input(s): CKTOTAL, CKMB, CKMBINDEX, TROPONINI in the last 168 hours. BNP (last 3 results) No results for input(s): PROBNP in the last 8760 hours. HbA1C: No results for input(s): HGBA1C in the last 72 hours. CBG: No results for input(s): GLUCAP in the last 168 hours. Lipid Profile: No results for input(s): CHOL, HDL, LDLCALC, TRIG, CHOLHDL, LDLDIRECT in the last 72 hours. Thyroid Function Tests: No results for input(s): TSH, T4TOTAL, FREET4, T3FREE, THYROIDAB in the last 72 hours. Anemia Panel: No results for input(s): VITAMINB12, FOLATE, FERRITIN, TIBC, IRON, RETICCTPCT in the last 72 hours. Sepsis Labs: No results for input(s): PROCALCITON, LATICACIDVEN in the last 168 hours.  No results found for this or any previous visit (from the past 240 hour(s)).     Radiology Studies: No results found.    Scheduled Meds: . amLODipine  10 mg Oral Daily  . aspirin EC  81 mg Oral Daily  . buPROPion  75 mg Oral Daily  . busPIRone  10 mg Oral BID  . cloNIDine  0.2 mg Oral BID  . folic acid  1 mg Oral Daily  . heparin subcutaneous  5,000 Units Subcutaneous Q8H  . hydrALAZINE  25 mg Oral BID  . multivitamin with minerals  1 tablet Oral Daily  . thiamine  100 mg Oral Daily   Continuous Infusions: . sodium chloride       LOS: 0 days    Time spent: 40 minutes   Dessa Phi, DO [facility] [email or web address] Password [email or web address] 04/21/2017, 11:46 AM
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
## 2017-04-22 LAB — BASIC METABOLIC PANEL
Anion gap: 9 (ref 5–15)
BUN: 46 mg/dL — AB (ref 6–20)
CHLORIDE: 106 mmol/L (ref 101–111)
CO2: 24 mmol/L (ref 22–32)
CREATININE: 4.5 mg/dL — AB (ref 0.61–1.24)
Calcium: 8.4 mg/dL — ABNORMAL LOW (ref 8.9–10.3)
GFR calc Af Amer: 16 mL/min — ABNORMAL LOW (ref >60)
GFR calc non Af Amer: 14 mL/min — ABNORMAL LOW (ref >60)
Glucose, Bld: 98 mg/dL (ref 65–99)
Potassium: 3.9 mmol/L (ref 3.5–5.1)
SODIUM: 139 mmol/L (ref 135–145)

## 2017-04-22 MED ORDER — HYDRALAZINE HCL 25 MG PO TABS
25.0000 mg | ORAL_TABLET | Freq: Three times a day (TID) | ORAL | Status: DC
  Administered 2017-04-22 – 2017-04-25 (×11): 25 mg via ORAL
  Filled 2017-04-22 (×11): qty 1
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
## 2017-04-22 NOTE — Progress Notes (Addendum)
CSW contacted [facility] for placement- AC will contact CSW if any beds become available today.   CSW received call back from [facility], [facility] at [facility], patient has VA benefits therefore will have to be referred to [facility] at this time. If patient is declined by the [facility] for inpatient psych CSW will follow up with [facility].   Kingsley Spittle, [facility] Clinical Social Worker [phone]

## 2017-04-22 NOTE — Progress Notes (Signed)
PROGRESS NOTE    Darroll Bredeson  IHK:[id number] DOB: [birth year] DOA: 04/20/2017 PCP: System, Provider Not In     Brief Narrative:  Gabriel Ashley is a 51 y.o. male with medical history significant of uncontrolled hypertension, substance abuse, chronic renal failure secondary to hypertension, CHF, and asthma who presented to the emergency room with complaints of suicidal ideation. He states that he was supposed to return to [place] yesterday, which has exacerbated his stress level. He started using cocaine 2 days ago, has had suicidal ideation. He states that about a month ago, he has sought care for his recurrent depression.   Assessment & Plan:   Principal Problem:   Suicidal ideation Active Problems:   Depressive disorder   Acute renal failure (ARF) (HCC)   Hypertensive urgency   Suicidal ideation with depression, hx PTSD -Psych consulted, recommending inpatient psych placement  -Sitter at bedside, suicide precautions -Continue wellbutrin  AKI on CKD 4 -Secondary to uncontrolled HTN  -Baseline Cr 4.2  -Cr 6.2 --> 4.5 with supportive care. Continue to trend, adequate UOP.  -Hold lasix for now   Chronic systolic and diastolic heart failure -Echo 12/05/2016 showed EF 75%, grade 1 diastolic dysfunction -Hold lasix in setting of AKI, will also stop IVF and observe -Currently euvolemic   Hypertensive urgency -Secondary to medical noncompliance, cocaine use -Improved this morning  -Continue norvasc, catapres, hydralazine - dose of hydralazine increased today  -Holding coreg due to bradycardia   Chronic right shoulder pain due to arthritis -Tylenol for pain, no narcotics    DVT prophylaxis: subq hep Code Status: Full Family Communication: no family at bedside  Disposition Plan: pending inpatient psych placement. Medically stable for discharge once placement available.    Consultants:   Psych  Procedures:   None  Antimicrobials:  Anti-infectives    None        Subjective: Patient evaluated at bedside with sitter present. He is in good spirits this morning. Has some soreness of right shoulder which is chronic in nature due to his arthritis. No other complaints. Awaiting psych placement which patient is in agreement with.   Objective: Vitals:   04/21/17 1414 04/21/17 2109 04/21/17 2200 04/22/17 0618  BP: 138/70 (!) 165/109 (!) 160/93 (!) 160/87  Pulse: 62 69 (!) 57 (!) 50  Resp: 16 14  18   Temp: 98.1 F (36.7 C)  97.7 F (36.5 C) 98.2 F (36.8 C)  TempSrc: Oral  Oral Oral  SpO2: 100% 100%  100%  Weight:      Height:        Intake/Output Summary (Last 24 hours) at 04/22/17 0954 Last data filed at 04/22/17 0754  Gross per 24 hour  Intake             1665 ml  Output                0 ml  Net             1665 ml   Filed Weights   04/20/17 1510  Weight: 91.3 kg (201 lb 4.5 oz)    Examination:  General exam: Appears calm and comfortable  Respiratory system: Clear to auscultation. Respiratory effort normal. Cardiovascular system: S1 & S2 heard, RRR. No JVD, murmurs, rubs, gallops or clicks. No pedal edema. Gastrointestinal system: Abdomen is nondistended, soft and nontender. No organomegaly or masses felt. Normal bowel sounds heard. Central nervous system: Alert and oriented. No focal neurological deficits. Extremities: Symmetric 5 x 5 power. Skin: No rashes, lesions  or ulcers Psychiatry: Mood & affect appropriate.   Data Reviewed: I have personally reviewed following labs and imaging studies  CBC:  Recent Labs Lab 04/20/17 1136  WBC 8.5  HGB 12.5*  HCT 36.6*  MCV 83.2  PLT 856   Basic Metabolic Panel:  Recent Labs Lab 04/20/17 1136 04/21/17 0427 04/22/17 0407  NA 142 139 139  K 3.7 3.7 3.9  CL 108 104 106  CO2 20* 24 24  GLUCOSE 117* 106* 98  BUN 56* 48* 46*  CREATININE 6.20* 4.88* 4.50*  CALCIUM 9.1 8.6* 8.4*   GFR: Estimated Creatinine Clearance: 21.1 mL/min (A) (by C-G formula based on SCr of 4.5  mg/dL (H)). Liver Function Tests:  Recent Labs Lab 04/20/17 1136 04/21/17 0427  AST 27 24  ALT 20 16*  ALKPHOS 105 86  BILITOT 0.7 0.5  PROT 8.9* 7.5  ALBUMIN 4.7 4.0   No results for input(s): LIPASE, AMYLASE in the last 168 hours. No results for input(s): AMMONIA in the last 168 hours. Coagulation Profile: No results for input(s): INR, PROTIME in the last 168 hours. Cardiac Enzymes: No results for input(s): CKTOTAL, CKMB, CKMBINDEX, TROPONINI in the last 168 hours. BNP (last 3 results) No results for input(s): PROBNP in the last 8760 hours. HbA1C: No results for input(s): HGBA1C in the last 72 hours. CBG: No results for input(s): GLUCAP in the last 168 hours. Lipid Profile: No results for input(s): CHOL, HDL, LDLCALC, TRIG, CHOLHDL, LDLDIRECT in the last 72 hours. Thyroid Function Tests: No results for input(s): TSH, T4TOTAL, FREET4, T3FREE, THYROIDAB in the last 72 hours. Anemia Panel: No results for input(s): VITAMINB12, FOLATE, FERRITIN, TIBC, IRON, RETICCTPCT in the last 72 hours. Sepsis Labs: No results for input(s): PROCALCITON, LATICACIDVEN in the last 168 hours.  No results found for this or any previous visit (from the past 240 hour(s)).     Radiology Studies: No results found.    Scheduled Meds: . amLODipine  10 mg Oral Daily  . aspirin EC  81 mg Oral Daily  . buPROPion  75 mg Oral Daily  . cloNIDine  0.2 mg Oral BID  . folic acid  1 mg Oral Daily  . gabapentin  200 mg Oral BID  . heparin subcutaneous  5,000 Units Subcutaneous Q8H  . hydrALAZINE  25 mg Oral TID  . multivitamin with minerals  1 tablet Oral Daily  . thiamine  100 mg Oral Daily   Continuous Infusions:    LOS: 1 day    Time spent: 20 minutes   Dessa Phi, DO [facility] [email or web address] Password [email or web address] 04/22/2017, 9:54 AM

## 2017-04-23 LAB — BASIC METABOLIC PANEL
ANION GAP: 8 (ref 5–15)
BUN: 38 mg/dL — AB (ref 6–20)
CHLORIDE: 108 mmol/L (ref 101–111)
CO2: 24 mmol/L (ref 22–32)
Calcium: 8.5 mg/dL — ABNORMAL LOW (ref 8.9–10.3)
Creatinine, Ser: 3.89 mg/dL — ABNORMAL HIGH (ref 0.61–1.24)
GFR, EST AFRICAN AMERICAN: 19 mL/min — AB (ref >60)
GFR, EST NON AFRICAN AMERICAN: 16 mL/min — AB (ref >60)
Glucose, Bld: 95 mg/dL (ref 65–99)
POTASSIUM: 4.2 mmol/L (ref 3.5–5.1)
SODIUM: 140 mmol/L (ref 135–145)

## 2017-04-23 MED ORDER — POLYETHYLENE GLYCOL 3350 17 G PO PACK
17.0000 g | PACK | Freq: Every day | ORAL | Status: DC
  Administered 2017-04-23 – 2017-04-25 (×3): 17 g via ORAL
  Filled 2017-04-23 (×3): qty 1

## 2017-04-23 MED ORDER — DOCUSATE SODIUM 100 MG PO CAPS: 100.0000 mg | ORAL_CAPSULE | Freq: Every day | ORAL | Status: DC | PRN

## 2017-04-23 NOTE — Progress Notes (Signed)
PROGRESS NOTE    Gabriel Ashley  [id number]:[id number] DOB: 09-09-1966 DOA: 04/20/2017 PCP: System, Provider Not In     Brief Narrative:  Gabriel Ashley is a 51 y.o. male with medical history significant of uncontrolled hypertension, substance abuse, chronic renal failure secondary to hypertension, CHF, and asthma who presented to the emergency room with complaints of suicidal ideation. He states that he was supposed to return to [place] yesterday, which has exacerbated his stress level. He started using cocaine 2 days ago, has had suicidal ideation. He states that about a month ago, he has sought care for his recurrent depression. He was evaluated by psychiatry and recommended for inpatient psych transfer.   Assessment & Plan:   Principal Problem:   Suicidal ideation Active Problems:   Depressive disorder   Acute renal failure (ARF) (HCC)   Hypertensive urgency   Suicidal ideation with depression, hx PTSD -Psych consulted, recommending inpatient psych placement  -Sitter at bedside, suicide precautions -Continue wellbutrin  AKI on CKD 4 -Secondary to uncontrolled HTN  -Baseline Cr 4.2  -Cr 6.2 --> 3.89 with supportive care. Continue to trend, adequate UOP.  -Hold lasix for now   Chronic systolic and diastolic heart failure -Echo 12/05/2016 showed EF 50%, grade 1 diastolic dysfunction -Hold lasix in setting of AKI, will also stop IVF and observe -Currently euvolemic   Hypertensive urgency -Secondary to medical noncompliance, cocaine use -Improved this morning  -Continue norvasc, catapres, hydralazine  -Holding coreg due to bradycardia   Chronic right shoulder pain due to arthritis -Tylenol for pain, no narcotics   Constipation -Miralax, colace ordered    DVT prophylaxis: subq hep Code Status: Full Family Communication: no family at bedside  Disposition Plan: pending inpatient psych placement. Medically stable for discharge once placement available.    Consultants:    Psych  Procedures:   None  Antimicrobials:  Anti-infectives    None       Subjective: Patient evaluated at bedside with sitter present. He is in good spirits this morning. Continues to have pain in his right shoulder due to arthritis. States things are "fair." No other physical complaints.   Objective: Vitals:   04/21/17 2200 04/22/17 0618 04/22/17 1503 04/23/17 0611  BP: (!) 160/93 (!) 160/87 (!) 146/86 (!) 154/78  Pulse: (!) 57 (!) 50 66 68  Resp:  18 16 17   Temp: 97.7 F (36.5 C) 98.2 F (36.8 C) 98 F (36.7 C) 98.3 F (36.8 C)  TempSrc: Oral Oral Oral Oral  SpO2:  100% 100% 100%  Weight:      Height:        Intake/Output Summary (Last 24 hours) at 04/23/17 1012 Last data filed at 04/23/17 0600  Gross per 24 hour  Intake             1200 ml  Output                0 ml  Net             1200 ml   Filed Weights   04/20/17 1510  Weight: 91.3 kg (201 lb 4.5 oz)    Examination:  General exam: Appears calm and comfortable  Respiratory system: Clear to auscultation. Respiratory effort normal. Cardiovascular system: S1 & S2 heard, RRR. No JVD, murmurs, rubs, gallops or clicks. No pedal edema. Gastrointestinal system: Abdomen is nondistended, soft and nontender. No organomegaly or masses felt. Normal bowel sounds heard. Central nervous system: Alert and oriented. No focal neurological deficits. Extremities: Symmetric 5  x 5 power. Skin: No rashes, lesions or ulcers Psychiatry: Mood & affect appropriate.   Data Reviewed: I have personally reviewed following labs and imaging studies  CBC:  Recent Labs Lab 04/20/17 1136  WBC 8.5  HGB 12.5*  HCT 36.6*  MCV 83.2  PLT 920   Basic Metabolic Panel:  Recent Labs Lab 04/20/17 1136 04/21/17 0427 04/22/17 0407 04/23/17 0439  NA 142 139 139 140  K 3.7 3.7 3.9 4.2  CL 108 104 106 108  CO2 20* 24 24 24   GLUCOSE 117* 106* 98 95  BUN 56* 48* 46* 38*  CREATININE 6.20* 4.88* 4.50* 3.89*  CALCIUM 9.1 8.6*  8.4* 8.5*   GFR: Estimated Creatinine Clearance: 24.4 mL/min (A) (by C-G formula based on SCr of 3.89 mg/dL (H)). Liver Function Tests:  Recent Labs Lab 04/20/17 1136 04/21/17 0427  AST 27 24  ALT 20 16*  ALKPHOS 105 86  BILITOT 0.7 0.5  PROT 8.9* 7.5  ALBUMIN 4.7 4.0   No results for input(s): LIPASE, AMYLASE in the last 168 hours. No results for input(s): AMMONIA in the last 168 hours. Coagulation Profile: No results for input(s): INR, PROTIME in the last 168 hours. Cardiac Enzymes: No results for input(s): CKTOTAL, CKMB, CKMBINDEX, TROPONINI in the last 168 hours. BNP (last 3 results) No results for input(s): PROBNP in the last 8760 hours. HbA1C: No results for input(s): HGBA1C in the last 72 hours. CBG: No results for input(s): GLUCAP in the last 168 hours. Lipid Profile: No results for input(s): CHOL, HDL, LDLCALC, TRIG, CHOLHDL, LDLDIRECT in the last 72 hours. Thyroid Function Tests: No results for input(s): TSH, T4TOTAL, FREET4, T3FREE, THYROIDAB in the last 72 hours. Anemia Panel: No results for input(s): VITAMINB12, FOLATE, FERRITIN, TIBC, IRON, RETICCTPCT in the last 72 hours. Sepsis Labs: No results for input(s): PROCALCITON, LATICACIDVEN in the last 168 hours.  No results found for this or any previous visit (from the past 240 hour(s)).     Radiology Studies: No results found.    Scheduled Meds: . amLODipine  10 mg Oral Daily  . aspirin EC  81 mg Oral Daily  . buPROPion  75 mg Oral Daily  . cloNIDine  0.2 mg Oral BID  . folic acid  1 mg Oral Daily  . gabapentin  200 mg Oral BID  . heparin subcutaneous  5,000 Units Subcutaneous Q8H  . hydrALAZINE  25 mg Oral TID  . multivitamin with minerals  1 tablet Oral Daily  . polyethylene glycol  17 g Oral Daily  . thiamine  100 mg Oral Daily   Continuous Infusions:    LOS: 2 days    Time spent: 20 minutes   Dessa Phi, DO [facility] [email or web address] Password [email or web address] 04/23/2017, 10:12 AM

## 2017-04-23 NOTE — Clinical Social Work Note (Signed)
Clinical Social Work Assessment  Patient Details  Name: Gabriel Ashley MRN: [id number] Date of Birth: 10-24-1965  Date of referral:  04/23/17               Reason for consult:  Facility Placement                Permission sought to share information with:  Psychiatrist, Chartered certified accountant granted to share information::  Yes, Verbal Permission Granted  Name::        Agency::  [facility]   Relationship::     Contact Information:     Housing/Transportation Living arrangements for the past 2 months:  Apartment Source of Information:  Patient Patient Interpreter Needed:  None Criminal Activity/Legal Involvement Pertinent to Current Situation/Hospitalization:  Yes Significant Relationships:   Family lives in [place] with:  Self Do you feel safe going back to the place where you live?  Yes Need for family participation in patient care:  Yes (Comment)  Care giving concerns: Suicidal ideations after restarting cocaine 2 days ago. Psych Evaluation completed- Recommends inpatient psychiatric placement once medically stable.  Patient is a English as a second language teacher, will explore [facility] placement.    Social Worker assessment / plan: CSW met with patient at bedside, explain role and reason for visit. Patient alert and oriented and agreeable to talk with CSW. Patient reports he lives in [place] and recently relapsed on cocaine and has had suicidal ideations. Patient has been stressed and overwhelmed  Patient reports he came to the hospital for help. Patient reports he goes to the [facility] hospital in [place] for mental health treatment and cannot recall last visit.  Plan: [facility] transfer application completed. Physician signed. Patient signed Agreement form- Assist with placement to psychiatric facility.    Employment status:   Unemployed Forensic scientist:  [facility] Benefit PT Recommendations:  Not assessed at this time Information / Referral to community resources:  Inpatient  Psychiatric Care (Comment Required) (Psychiatrist Recommeds inaptient psych. placement)  Patient/Family's Response to care:  No family at bedside.Patient agreeable to inpatient, pt. Is concerned he will have to stay inpatient for at least 28 days. He is concern he will miss the start of school on 8/16. Patient reports his vehicle is in parking lot and worried about transfer without his vehicle.   Patient/Family's Understanding of and Emotional Response to Diagnosis, Current Treatment, and Prognosis: No family at bedside, patient understands he is feeling depressed, sought help and is voluntary to treatment.   Emotional Assessment Appearance:  Appears stated age Attitude/Demeanor/Rapport:    Affect (typically observed):  Pleasant, Calm Orientation:  Oriented to Self, Oriented to Place, Oriented to  Time, Oriented to Situation Alcohol / Substance use:  Illicit Drugs Psych involvement (Current and /or in the community):  Yes -recommends inpatient follow up.   Discharge Needs  Concerns to be addressed:  Discharge Planning Concerns, Substance Abuse Concerns, Mental Health Concerns Readmission within the last 30 days:  Yes Current discharge risk:  None Barriers to Discharge:  Other ([facility] approval for Transfer to [facility] )   Lia Hopping, LCSW 04/23/2017, 11:00 AM

## 2017-04-23 NOTE — Progress Notes (Addendum)
CSW assisting with inpatient psychiatric hospital. Patient is veteran, CSW follow up with [facility] placement. CSW completing application mental health transfer at this time. Will inform medical staff if patient is accepted and bed is available.  CSW met with patient and completed "patient consent for transfer form." [id number] Application completed and submitted to [place], [place], [place], [place].  CSW left confidential voicemail with transfer coordinator April Alexander.   2:00PM CSW called and made contact with [facility] with admissions, she reports they are currently full and have no beds. They have received patient information.   4:00PM CSW updated patient about current status with [facility]. Patient requested information about past [facility], csw provided contact number billing and admission for patient to follow up at discharge.    Kathrin Greathouse, Latanya Presser, MSW Clinical Social Worker 5E and Psychiatric Service Line [phone] 04/23/2017  9:37 AM

## 2017-04-24 NOTE — Progress Notes (Signed)
Patient self administered CPAP 

## 2017-04-24 NOTE — Progress Notes (Addendum)
CSW spoke with Claiborne Billings at [place], she reports all [facility] are full. CSW updated patient bedside. CSW was informed by Leadership to try [facility] for placement.   2:30PM- CSW discussed disposition with Otila Kluver at [facility], she reports at this time they do not have beds available. She will inform CSW when there is a bed.  CSW updated patient at bedside.   Kathrin Greathouse, Latanya Presser, MSW Clinical Social Worker 5E and [place] [phone] 04/24/2017  12:46 PM

## 2017-04-24 NOTE — Progress Notes (Signed)
PROGRESS NOTE    Gabriel Ashley  [id number]:[id number] DOB: Mar 05, 1966 DOA: 04/20/2017 PCP: System, Provider Not In     Brief Narrative:  Gabriel Ashley is a 51 y.o. male with medical history significant of uncontrolled hypertension, substance abuse, chronic renal failure secondary to hypertension, CHF, and asthma who presented to the emergency room with complaints of suicidal ideation. He states that he was supposed to return to [place] yesterday, which has exacerbated his stress level. He started using cocaine 2 days ago, has had suicidal ideation. He states that about a month ago, he has sought care for his recurrent depression. He was evaluated by psychiatry and recommended for inpatient psych transfer.   Assessment & Plan:   Principal Problem:   Suicidal ideation Active Problems:   Depressive disorder   Acute renal failure (ARF) (HCC)   Hypertensive urgency   Suicidal ideation with depression, hx PTSD -Psych consulted, recommending inpatient psych placement  -Sitter at bedside, suicide precautions -Continue wellbutrin  AKI on CKD 4 -Secondary to uncontrolled HTN  -Baseline Cr 4.2  -Cr 6.2 --> 3.89 with supportive care. Continue to trend, adequate UOP.  -Hold lasix for now   Chronic systolic and diastolic heart failure -Echo 12/05/2016 showed EF 01%, grade 1 diastolic dysfunction -Hold lasix in setting of AKI, will also stop IVF and observe -Currently euvolemic   Hypertensive urgency -Secondary to medical noncompliance, cocaine use -Improved this morning  -Continue norvasc, catapres, hydralazine  -Holding coreg due to bradycardia   Chronic right shoulder pain due to arthritis -Tylenol for pain, no narcotics   Constipation -Miralax, colace ordered   OSA -CPAP qhs    DVT prophylaxis: subq hep Code Status: Full Family Communication: no family at bedside  Disposition Plan: pending inpatient psych placement. Medically stable for discharge once placement available.     Consultants:   Psych  Procedures:   None  Antimicrobials:  Anti-infectives    None       Subjective: Patient evaluated at bedside with sitter present. He is in good spirits this morning. Continues to have pain in his right shoulder due to arthritis. States things are "fair." No other physical complaints. Awaiting transfer to psych facility which he is in agreement with.   Objective: Vitals:   04/23/17 1425 04/23/17 2056 04/24/17 0641 04/24/17 1246  BP: (!) 163/84 (!) 141/78 140/84 (!) 147/73  Pulse: 60 61 60 (!) 59  Resp: 20 19 18 18   Temp: 98.1 F (36.7 C) 97.6 F (36.4 C) 97.8 F (36.6 C) 97.8 F (36.6 C)  TempSrc: Oral Oral Oral Oral  SpO2: 100% 100% 100% 100%  Weight:      Height:        Intake/Output Summary (Last 24 hours) at 04/24/17 1307 Last data filed at 04/24/17 1210  Gross per 24 hour  Intake              840 ml  Output                0 ml  Net              840 ml   Filed Weights   04/20/17 1510  Weight: 91.3 kg (201 lb 4.5 oz)    Examination:  General exam: Appears calm and comfortable  Respiratory system: Clear to auscultation. Respiratory effort normal. Cardiovascular system: S1 & S2 heard, RRR. No JVD, murmurs, rubs, gallops or clicks. No pedal edema. Gastrointestinal system: Abdomen is nondistended, soft and nontender. No organomegaly or masses felt. Normal  bowel sounds heard. Central nervous system: Alert and oriented. No focal neurological deficits. Extremities: Symmetric 5 x 5 power. Skin: No rashes, lesions or ulcers Psychiatry: Mood & affect appropriate.   Data Reviewed: I have personally reviewed following labs and imaging studies  CBC:  Recent Labs Lab 04/20/17 1136  WBC 8.5  HGB 12.5*  HCT 36.6*  MCV 83.2  PLT 269   Basic Metabolic Panel:  Recent Labs Lab 04/20/17 1136 04/21/17 0427 04/22/17 0407 04/23/17 0439  NA 142 139 139 140  K 3.7 3.7 3.9 4.2  CL 108 104 106 108  CO2 20* 24 24 24   GLUCOSE 117* 106*  98 95  BUN 56* 48* 46* 38*  CREATININE 6.20* 4.88* 4.50* 3.89*  CALCIUM 9.1 8.6* 8.4* 8.5*   GFR: Estimated Creatinine Clearance: 24.4 mL/min (A) (by C-G formula based on SCr of 3.89 mg/dL (H)). Liver Function Tests:  Recent Labs Lab 04/20/17 1136 04/21/17 0427  AST 27 24  ALT 20 16*  ALKPHOS 105 86  BILITOT 0.7 0.5  PROT 8.9* 7.5  ALBUMIN 4.7 4.0   No results for input(s): LIPASE, AMYLASE in the last 168 hours. No results for input(s): AMMONIA in the last 168 hours. Coagulation Profile: No results for input(s): INR, PROTIME in the last 168 hours. Cardiac Enzymes: No results for input(s): CKTOTAL, CKMB, CKMBINDEX, TROPONINI in the last 168 hours. BNP (last 3 results) No results for input(s): PROBNP in the last 8760 hours. HbA1C: No results for input(s): HGBA1C in the last 72 hours. CBG: No results for input(s): GLUCAP in the last 168 hours. Lipid Profile: No results for input(s): CHOL, HDL, LDLCALC, TRIG, CHOLHDL, LDLDIRECT in the last 72 hours. Thyroid Function Tests: No results for input(s): TSH, T4TOTAL, FREET4, T3FREE, THYROIDAB in the last 72 hours. Anemia Panel: No results for input(s): VITAMINB12, FOLATE, FERRITIN, TIBC, IRON, RETICCTPCT in the last 72 hours. Sepsis Labs: No results for input(s): PROCALCITON, LATICACIDVEN in the last 168 hours.  No results found for this or any previous visit (from the past 240 hour(s)).     Radiology Studies: No results found.    Scheduled Meds: . amLODipine  10 mg Oral Daily  . aspirin EC  81 mg Oral Daily  . buPROPion  75 mg Oral Daily  . cloNIDine  0.2 mg Oral BID  . folic acid  1 mg Oral Daily  . gabapentin  200 mg Oral BID  . heparin subcutaneous  5,000 Units Subcutaneous Q8H  . hydrALAZINE  25 mg Oral TID  . multivitamin with minerals  1 tablet Oral Daily  . polyethylene glycol  17 g Oral Daily  . thiamine  100 mg Oral Daily   Continuous Infusions:    LOS: 3 days    Time spent: 20 minutes   Dessa Phi, DO [facility] [email or web address] Password [email or web address] 04/24/2017, 1:07 PM

## 2017-04-24 NOTE — Progress Notes (Signed)
PROGRESS NOTE    Gabriel Ashley  WCB:[id number] DOB: 11-Apr-1966 DOA: 04/20/2017 PCP: System, Provider Not In     Brief Narrative:  Rudolpho Ashley is a 51 y.o. male with medical history significant of uncontrolled hypertension, substance abuse, chronic renal failure secondary to hypertension, CHF, and asthma who presented to the emergency room with complaints of suicidal ideation. He states that he was supposed to return to [place] yesterday, which has exacerbated his stress level. He started using cocaine 2 days ago, has had suicidal ideation. He states that about a month ago, he has sought care for his recurrent depression. He was evaluated by psychiatry and recommended for inpatient psych transfer.   Assessment & Plan:   Principal Problem:   Suicidal ideation Active Problems:   Depressive disorder   Acute renal failure (ARF) (HCC)   Hypertensive urgency   Suicidal ideation with depression, hx PTSD -Psych consulted, recommending inpatient psych placement  -Sitter at bedside, suicide precautions -Continue wellbutrin  AKI on CKD 4 -Secondary to uncontrolled HTN  -Baseline Cr 4.2  -Cr 6.2 --> 3.89 with supportive care. Continue to trend, adequate UOP.  -Hold lasix for now   Chronic systolic and diastolic heart failure -Echo 12/05/2016 showed EF 61%, grade 1 diastolic dysfunction -Hold lasix in setting of AKI, will also stop IVF and observe -Currently euvolemic   Hypertensive urgency -Secondary to medical noncompliance, cocaine use -Improved this morning  -Continue norvasc, catapres, hydralazine  -Holding coreg due to bradycardia   Chronic right shoulder pain due to arthritis -Tylenol for pain, no narcotics   Constipation -Miralax, colace ordered    DVT prophylaxis: subq hep Code Status: Full Family Communication: no family at bedside  Disposition Plan: pending inpatient psych placement. Medically stable for discharge once placement available.    Consultants:    Psych  Procedures:   None  Antimicrobials:  Anti-infectives    None       Subjective: Patient evaluated at bedside with sitter present. He is in good spirits this morning. Continues to have pain in his right shoulder due to arthritis. States things are "fair." No other physical complaints. Awaiting transfer to psych facility which he is in agreement with.   Objective: Vitals:   04/23/17 0611 04/23/17 1425 04/23/17 2056 04/24/17 0641  BP: (!) 154/78 (!) 163/84 (!) 141/78 140/84  Pulse: 68 60 61 60  Resp: 17 20 19 18   Temp: 98.3 F (36.8 C) 98.1 F (36.7 C) 97.6 F (36.4 C) 97.8 F (36.6 C)  TempSrc: Oral Oral Oral Oral  SpO2: 100% 100% 100% 100%  Weight:      Height:        Intake/Output Summary (Last 24 hours) at 04/24/17 1114 Last data filed at 04/24/17 0804  Gross per 24 hour  Intake              840 ml  Output                0 ml  Net              840 ml   Filed Weights   04/20/17 1510  Weight: 91.3 kg (201 lb 4.5 oz)    Examination:  General exam: Appears calm and comfortable  Respiratory system: Clear to auscultation. Respiratory effort normal. Cardiovascular system: S1 & S2 heard, RRR. No JVD, murmurs, rubs, gallops or clicks. No pedal edema. Gastrointestinal system: Abdomen is nondistended, soft and nontender. No organomegaly or masses felt. Normal bowel sounds heard. Central nervous system:  Alert and oriented. No focal neurological deficits. Extremities: Symmetric 5 x 5 power. Skin: No rashes, lesions or ulcers Psychiatry: Mood & affect appropriate.   Data Reviewed: I have personally reviewed following labs and imaging studies  CBC:  Recent Labs Lab 04/20/17 1136  WBC 8.5  HGB 12.5*  HCT 36.6*  MCV 83.2  PLT 741   Basic Metabolic Panel:  Recent Labs Lab 04/20/17 1136 04/21/17 0427 04/22/17 0407 04/23/17 0439  NA 142 139 139 140  K 3.7 3.7 3.9 4.2  CL 108 104 106 108  CO2 20* 24 24 24   GLUCOSE 117* 106* 98 95  BUN 56* 48* 46*  38*  CREATININE 6.20* 4.88* 4.50* 3.89*  CALCIUM 9.1 8.6* 8.4* 8.5*   GFR: Estimated Creatinine Clearance: 24.4 mL/min (A) (by C-G formula based on SCr of 3.89 mg/dL (H)). Liver Function Tests:  Recent Labs Lab 04/20/17 1136 04/21/17 0427  AST 27 24  ALT 20 16*  ALKPHOS 105 86  BILITOT 0.7 0.5  PROT 8.9* 7.5  ALBUMIN 4.7 4.0   No results for input(s): LIPASE, AMYLASE in the last 168 hours. No results for input(s): AMMONIA in the last 168 hours. Coagulation Profile: No results for input(s): INR, PROTIME in the last 168 hours. Cardiac Enzymes: No results for input(s): CKTOTAL, CKMB, CKMBINDEX, TROPONINI in the last 168 hours. BNP (last 3 results) No results for input(s): PROBNP in the last 8760 hours. HbA1C: No results for input(s): HGBA1C in the last 72 hours. CBG: No results for input(s): GLUCAP in the last 168 hours. Lipid Profile: No results for input(s): CHOL, HDL, LDLCALC, TRIG, CHOLHDL, LDLDIRECT in the last 72 hours. Thyroid Function Tests: No results for input(s): TSH, T4TOTAL, FREET4, T3FREE, THYROIDAB in the last 72 hours. Anemia Panel: No results for input(s): VITAMINB12, FOLATE, FERRITIN, TIBC, IRON, RETICCTPCT in the last 72 hours. Sepsis Labs: No results for input(s): PROCALCITON, LATICACIDVEN in the last 168 hours.  No results found for this or any previous visit (from the past 240 hour(s)).     Radiology Studies: No results found.    Scheduled Meds: . amLODipine  10 mg Oral Daily  . aspirin EC  81 mg Oral Daily  . buPROPion  75 mg Oral Daily  . cloNIDine  0.2 mg Oral BID  . folic acid  1 mg Oral Daily  . gabapentin  200 mg Oral BID  . heparin subcutaneous  5,000 Units Subcutaneous Q8H  . hydrALAZINE  25 mg Oral TID  . multivitamin with minerals  1 tablet Oral Daily  . polyethylene glycol  17 g Oral Daily  . thiamine  100 mg Oral Daily   Continuous Infusions:    LOS: 3 days    Time spent: 20 minutes   Dessa Phi, DO [facility] [email or web address] Password [email or web address] 04/24/2017, 11:14 AM

## 2017-04-25 ENCOUNTER — Encounter (HOSPITAL_COMMUNITY): Payer: Self-pay | Admitting: *Deleted

## 2017-04-25 ENCOUNTER — Inpatient Hospital Stay (HOSPITAL_COMMUNITY)
Admission: RE | Admit: 2017-04-25 | Discharge: 2017-04-30 | DRG: 885 | Disposition: A | Discharge status: Home / Self Care | Payer: Federal, State, Local not specified - Other | Attending: Psychiatry | Admitting: Psychiatry

## 2017-04-25 DIAGNOSIS — F4310 Post-traumatic stress disorder, unspecified: Secondary | ICD-10-CM | POA: Diagnosis present

## 2017-04-25 DIAGNOSIS — J45909 Unspecified asthma, uncomplicated: Secondary | ICD-10-CM | POA: Diagnosis present

## 2017-04-25 DIAGNOSIS — F339 Major depressive disorder, recurrent, unspecified: Secondary | ICD-10-CM | POA: Diagnosis not present

## 2017-04-25 DIAGNOSIS — I5032 Chronic diastolic (congestive) heart failure: Secondary | ICD-10-CM | POA: Diagnosis present

## 2017-04-25 DIAGNOSIS — F1424 Cocaine dependence with cocaine-induced mood disorder: Secondary | ICD-10-CM

## 2017-04-25 DIAGNOSIS — I160 Hypertensive urgency: Secondary | ICD-10-CM | POA: Diagnosis not present

## 2017-04-25 DIAGNOSIS — R45851 Suicidal ideations: Secondary | ICD-10-CM

## 2017-04-25 DIAGNOSIS — F1490 Cocaine use, unspecified, uncomplicated: Secondary | ICD-10-CM | POA: Diagnosis not present

## 2017-04-25 DIAGNOSIS — N170 Acute kidney failure with tubular necrosis: Secondary | ICD-10-CM | POA: Diagnosis not present

## 2017-04-25 DIAGNOSIS — I110 Hypertensive heart disease with heart failure: Secondary | ICD-10-CM | POA: Diagnosis present

## 2017-04-25 DIAGNOSIS — F17210 Nicotine dependence, cigarettes, uncomplicated: Secondary | ICD-10-CM | POA: Diagnosis present

## 2017-04-25 DIAGNOSIS — N179 Acute kidney failure, unspecified: Secondary | ICD-10-CM | POA: Diagnosis not present

## 2017-04-25 DIAGNOSIS — F332 Major depressive disorder, recurrent severe without psychotic features: Principal | ICD-10-CM | POA: Diagnosis present

## 2017-04-25 MED ORDER — DOCUSATE SODIUM 100 MG PO CAPS
100.0000 mg | ORAL_CAPSULE | Freq: Every day | ORAL | Status: DC | PRN
  Filled 2017-04-25: qty 7

## 2017-04-25 MED ORDER — BUPROPION HCL 75 MG PO TABS
75.0000 mg | ORAL_TABLET | Freq: Every day | ORAL | Status: DC
  Administered 2017-04-26: 75 mg via ORAL
  Filled 2017-04-25 (×3): qty 1

## 2017-04-25 MED ORDER — FOLIC ACID 1 MG PO TABS
1.0000 mg | ORAL_TABLET | Freq: Every day | ORAL | Status: DC
  Administered 2017-04-26 – 2017-04-30 (×5): 1 mg via ORAL
  Filled 2017-04-25 (×4): qty 1
  Filled 2017-04-25: qty 7
  Filled 2017-04-25 (×2): qty 1

## 2017-04-25 MED ORDER — ACETAMINOPHEN 325 MG PO TABS
650.0000 mg | ORAL_TABLET | Freq: Four times a day (QID) | ORAL | Status: DC | PRN
  Administered 2017-04-25 – 2017-04-30 (×8): 650 mg via ORAL
  Filled 2017-04-25 (×8): qty 2

## 2017-04-25 MED ORDER — GABAPENTIN 100 MG PO CAPS
200.0000 mg | ORAL_CAPSULE | Freq: Two times a day (BID) | ORAL | Status: DC
  Administered 2017-04-25 – 2017-04-26 (×3): 200 mg via ORAL
  Filled 2017-04-25 (×7): qty 2

## 2017-04-25 MED ORDER — ALBUTEROL SULFATE (2.5 MG/3ML) 0.083% IN NEBU: 3.0000 mL | INHALATION_SOLUTION | Freq: Four times a day (QID) | RESPIRATORY_TRACT | Status: DC | PRN

## 2017-04-25 MED ORDER — ADULT MULTIVITAMIN W/MINERALS CH
1.0000 | ORAL_TABLET | Freq: Every day | ORAL | Status: DC
  Administered 2017-04-26 – 2017-04-30 (×5): 1 via ORAL
  Filled 2017-04-25 (×4): qty 1
  Filled 2017-04-25: qty 7
  Filled 2017-04-25 (×2): qty 1

## 2017-04-25 MED ORDER — AMLODIPINE BESYLATE 10 MG PO TABS
10.0000 mg | ORAL_TABLET | Freq: Every day | ORAL | Status: DC
  Administered 2017-04-26 – 2017-04-30 (×5): 10 mg via ORAL
  Filled 2017-04-25 (×2): qty 1
  Filled 2017-04-25: qty 7
  Filled 2017-04-25 (×4): qty 1

## 2017-04-25 MED ORDER — NICOTINE 21 MG/24HR TD PT24
21.0000 mg | MEDICATED_PATCH | Freq: Every day | TRANSDERMAL | Status: DC
  Administered 2017-04-25 – 2017-04-30 (×6): 21 mg via TRANSDERMAL
  Filled 2017-04-25 (×9): qty 1

## 2017-04-25 MED ORDER — MAGNESIUM HYDROXIDE 400 MG/5ML PO SUSP: 30.0000 mL | Freq: Every day | ORAL | Status: DC | PRN

## 2017-04-25 MED ORDER — POLYETHYLENE GLYCOL 3350 17 G PO PACK
17.0000 g | PACK | Freq: Every day | ORAL | Status: DC
  Administered 2017-04-26 – 2017-04-30 (×5): 17 g via ORAL
  Filled 2017-04-25: qty 1
  Filled 2017-04-25: qty 7
  Filled 2017-04-25 (×5): qty 1

## 2017-04-25 MED ORDER — HYDRALAZINE HCL 25 MG PO TABS
25.0000 mg | ORAL_TABLET | Freq: Three times a day (TID) | ORAL | Status: DC
  Administered 2017-04-26 – 2017-04-30 (×14): 25 mg via ORAL
  Filled 2017-04-25: qty 1
  Filled 2017-04-25: qty 21
  Filled 2017-04-25 (×2): qty 1
  Filled 2017-04-25: qty 21
  Filled 2017-04-25 (×7): qty 1
  Filled 2017-04-25: qty 21
  Filled 2017-04-25 (×6): qty 1

## 2017-04-25 MED ORDER — HYDROXYZINE HCL 25 MG PO TABS
25.0000 mg | ORAL_TABLET | Freq: Three times a day (TID) | ORAL | Status: DC | PRN
  Administered 2017-04-25 – 2017-04-29 (×5): 25 mg via ORAL
  Filled 2017-04-25: qty 10
  Filled 2017-04-25 (×5): qty 1

## 2017-04-25 MED ORDER — ASPIRIN EC 81 MG PO TBEC
81.0000 mg | DELAYED_RELEASE_TABLET | Freq: Every day | ORAL | Status: DC
  Administered 2017-04-26 – 2017-04-30 (×5): 81 mg via ORAL
  Filled 2017-04-25: qty 1
  Filled 2017-04-25: qty 7
  Filled 2017-04-25 (×5): qty 1

## 2017-04-25 MED ORDER — CLONIDINE HCL 0.2 MG PO TABS
0.2000 mg | ORAL_TABLET | Freq: Two times a day (BID) | ORAL | Status: DC
  Administered 2017-04-25 – 2017-04-30 (×10): 0.2 mg via ORAL
  Filled 2017-04-25: qty 14
  Filled 2017-04-25: qty 2
  Filled 2017-04-25 (×5): qty 1
  Filled 2017-04-25: qty 14
  Filled 2017-04-25 (×7): qty 1

## 2017-04-25 MED ORDER — TRAZODONE HCL 50 MG PO TABS
50.0000 mg | ORAL_TABLET | Freq: Every evening | ORAL | Status: DC | PRN
  Administered 2017-04-25 – 2017-04-29 (×6): 50 mg via ORAL
  Filled 2017-04-25 (×4): qty 1
  Filled 2017-04-25: qty 7
  Filled 2017-04-25 (×3): qty 1

## 2017-04-25 NOTE — Tx Team (Signed)
Initial Treatment Plan 04/25/2017 8:14 PM Remo Kirschenmann [id number]    PATIENT STRESSORS: Financial difficulties Occupational concerns Substance abuse   PATIENT STRENGTHS: Ability for insight Active sense of humor Average or above average intelligence Capable of independent living Communication skills General fund of knowledge Motivation for treatment/growth Supportive family/friends   PATIENT IDENTIFIED PROBLEMS: Depression  Substance Abuse  "Taking care of medical bills"  "Stabilizing my attitude"               DISCHARGE CRITERIA:  Ability to meet basic life and health needs Improved stabilization in mood, thinking, and/or behavior Motivation to continue treatment in a less acute level of care Need for constant or close observation no longer present Verbal commitment to aftercare and medication compliance  PRELIMINARY DISCHARGE PLAN: Attend 12-step recovery group Outpatient therapy Return to previous living arrangement  PATIENT/FAMILY INVOLVEMENT: This treatment plan has been presented to and reviewed with the patient, Gabriel Ashley.  The patient and family have been given the opportunity to ask questions and make suggestions.  Dustin Flock, RN 04/25/2017, 8:14 PM

## 2017-04-25 NOTE — Progress Notes (Signed)
Report called to staff at [facility] unit [phone])

## 2017-04-25 NOTE — Progress Notes (Addendum)
CSW spoke with [facility]ib[facility] Billings, there are no [facility] at this time.  CSW met with patient inquired about wellbeing and updated patient about behavior health bed search. Patient reports feeling much better and reports understand process. CSW spoke to [place] at [facility], Per Claiborne Billings there are no available beds at the [facility] at this time, they are on  "Capacity Alert".   Kathrin Greathouse, Latanya Presser, MSW Clinical Social Worker 5E and Psychiatric Service Line [phone] 04/25/2017  12:21 PM

## 2017-04-25 NOTE — Progress Notes (Signed)
Patient ID: Gabriel Ashley, male   DOB: 18-Feb-1966, 51 y.o.   MRN: [id number]  Report received from admitting RN. Pt main complaint is pain and ongoing depression. Pt reports that he is here to "get better." Requests to receive as needed and scheduled medications. Pt's safety ensured with 15 minute and environmental checks. Pt currently denies SI/HI and A/V hallucinations. Pt verbally agrees to seek staff if SI/HI or A/VH occurs and to consult with staff before acting on any harmful thoughts. Will continue POC.

## 2017-04-25 NOTE — Progress Notes (Signed)
Patient transported to [facility] per [facility] transport, paperwork and personal belongings given to patient

## 2017-04-25 NOTE — Discharge Summary (Signed)
Physician Discharge Summary  Gabriel Ashley RDE:[id number] DOB: [birth year] DOA: 04/20/2017  PCP: System, Provider Not In  Admit date: 04/20/2017 Discharge date: 04/25/2017  Admitted From: Home Disposition: [place]   Recommendations for Outpatient Follow-up:  - Monitor blood pressure - Monitor BMP in 1 week with restart of lasix.   Discharge Condition: Stable CODE STATUS: Full Diet recommendation: Heart healthy  Brief/Interim Summary: Gabriel Ashley a 51 y.o.malewith medical history significant of uncontrolled hypertension, substance abuse, chronic renal failure secondary to hypertension, CHF, and asthma who presented to the emergency room with complaints of suicidal ideation. He states that he was supposed to return to [place] yesterday, which has exacerbated his stress level. He started using cocaine 2 days ago, has had suicidal ideation. He states that about a month ago, he has sought care for his recurrent depression. He was evaluated by psychiatry and recommended for inpatient psych transfer.   Discharge Diagnoses:  Principal Problem:   Suicidal ideation Active Problems:   Depressive disorder   Acute renal failure (ARF) (HCC)   Hypertensive urgency  Suicidal ideation with depression, hx PTSD - Psych consulted, recommending inpatient psych placement  - Sitter was at bedside, suicide precautions were provided during hospitalization - Continue wellbutrin and seroquel. Further management ongoing per psychiatry.  AKI on stage IV CKD: Cr 6.2 --> 3.89 with supportive care, near presumed baseline ~4.2.  - Monitor BMP in 1 week  Chronic systolic and diastolic heart failure Echo 12/05/2016 showed EF 63%, grade 1 diastolic dysfunction. Discharge weight from March 2018 was 196lbs, currently 201lbs as withholding lasix. - Ok to restart lasix and monitor BMP.  - No current evidence of overload  Hypertensive urgency: Secondary to medical noncompliance, cocaine use -  Improved with restart of medications.  - Holding coreg due to bradycardia and would attempt to refrain from beta blocker as outpatient given cocaine use  Chronic right shoulder pain due to arthritis - Tylenol for pain, no narcotics   Constipation - Miralax, colace recommended prn  Asthma: No exacerbation - Continue albuterol prn  OSA - CPAP qhs   Discharge Instructions  Allergies as of 04/25/2017   No Known Allergies     Medication List    STOP taking these medications   allopurinol 100 MG tablet Commonly known as:  ZYLOPRIM   busPIRone 10 MG tablet Commonly known as:  BUSPAR   carvedilol 3.125 MG tablet Commonly known as:  COREG     TAKE these medications   albuterol 108 (90 Base) MCG/ACT inhaler Commonly known as:  PROVENTIL HFA;VENTOLIN HFA Inhale 2 puffs into the lungs every 6 (six) hours as needed for wheezing or shortness of breath.   amLODipine 10 MG tablet Commonly known as:  NORVASC Take 1 tablet (10 mg total) by mouth daily.   buPROPion 75 MG tablet Commonly known as:  WELLBUTRIN Take 75 mg by mouth daily.   cloNIDine 0.2 MG tablet Commonly known as:  CATAPRES Take 1 tablet (0.2 mg total) by mouth 2 (two) times daily.   furosemide 40 MG tablet Commonly known as:  LASIX Take 1 tablet (40 mg total) by mouth 2 (two) times daily.   hydrALAZINE 50 MG tablet Commonly known as:  APRESOLINE Take 0.5 tablets (25 mg total) by mouth 2 (two) times daily.   multivitamin tablet Take 1 tablet by mouth daily.   QUEtiapine 100 MG tablet Commonly known as:  SEROQUEL Take 50 mg by mouth at bedtime.       No Known Allergies  Consultations:  Psychiatry  Procedures/Studies: None  Subjective: Pt without complaints, walking the halls, conversing with staff and other patients in hallway. No chest pain or dyspnea.   Discharge Exam: Vitals:   04/24/17 2050 04/25/17 0557  BP: (!) 159/90 139/77  Pulse: 63 (!) 59  Resp: 18 18  Temp: 98.1 F (36.7  C) 98 F (36.7 C)   General: Pt is alert, awake, not in acute distress Cardiovascular: RRR, S1/S2 +, no rubs, no gallops Respiratory: CTA bilaterally, no wheezing, no rhonchi Abdominal: Soft, NT, ND, bowel sounds + Extremities: No edema, no cyanosis  Labs: Basic Metabolic Panel:  Recent Labs Lab 04/20/17 1136 04/21/17 0427 04/22/17 0407 04/23/17 0439  NA 142 139 139 140  K 3.7 3.7 3.9 4.2  CL 108 104 106 108  CO2 20* 24 24 24   GLUCOSE 117* 106* 98 95  BUN 56* 48* 46* 38*  CREATININE 6.20* 4.88* 4.50* 3.89*  CALCIUM 9.1 8.6* 8.4* 8.5*   Liver Function Tests:  Recent Labs Lab 04/20/17 1136 04/21/17 0427  AST 27 24  ALT 20 16*  ALKPHOS 105 86  BILITOT 0.7 0.5  PROT 8.9* 7.5  ALBUMIN 4.7 4.0   CBC:  Recent Labs Lab 04/20/17 1136  WBC 8.5  HGB 12.5*  HCT 36.6*  MCV 83.2  PLT 203   Time coordinating discharge: Approximately 40 minutes  Vance Gather, MD  [facility] 04/25/2017, 1:13 PM Pager [phone]

## 2017-04-25 NOTE — Progress Notes (Signed)
Admission Note:  51 year old male who presents voluntary, in no acute distress, for the treatment of Depression and Substance Abuse. Patient appears depressed, animated at times. Patient was calm and cooperative with admission process. Patient presents with passive SI and contracts for safety upon admission. Patient denies AVH.  Patient identifies stressors as "I lost my job because I lost my transportation" and "medical bills".  Patient reports past medical hx of "Kidney disease". Patient reports recent cocaine use.  Patient currently lives alone and identifies "family and friends" as his support system.  While at [facility], patient would like to "take care of medical bills" and "stabilize my attitude".  Skin was assessed and found to be clear of any abnormal marks.  Patient searched and no contraband found, POC and unit policies explained and understanding verbalized. Consents obtained.  Patient had no additional questions or concerns.

## 2017-04-25 NOTE — Progress Notes (Addendum)
Patient has been accepted to [facility].  Room 402 Bed 2 Accepting Provider: Dr. Darleene Cleaver Attending: Dr. Parke Poisson Nurse given number to call for report: [phone] Voluntary Form signed and faxed to [phone] [facility] called, scheduled pick up for 1:45pm  Kathrin Greathouse, Latanya Presser, MSW Clinical Social Worker 5E and [place] [phone] 04/25/2017  1:28 PM

## 2017-04-25 NOTE — Progress Notes (Signed)
Called [facility] transportation to get ETA and was told that they had not been contacted to transport this patient, requested pick up and they are en route

## 2017-04-26 DIAGNOSIS — N179 Acute kidney failure, unspecified: Secondary | ICD-10-CM

## 2017-04-26 DIAGNOSIS — F17210 Nicotine dependence, cigarettes, uncomplicated: Secondary | ICD-10-CM

## 2017-04-26 DIAGNOSIS — I160 Hypertensive urgency: Secondary | ICD-10-CM

## 2017-04-26 DIAGNOSIS — F4310 Post-traumatic stress disorder, unspecified: Secondary | ICD-10-CM

## 2017-04-26 DIAGNOSIS — I5031 Acute diastolic (congestive) heart failure: Secondary | ICD-10-CM

## 2017-04-26 DIAGNOSIS — F1490 Cocaine use, unspecified, uncomplicated: Secondary | ICD-10-CM

## 2017-04-26 DIAGNOSIS — F1424 Cocaine dependence with cocaine-induced mood disorder: Secondary | ICD-10-CM

## 2017-04-26 MED ORDER — ESCITALOPRAM OXALATE 5 MG PO TABS: 5.0000 mg | ORAL_TABLET | Freq: Every day | ORAL | Status: DC

## 2017-04-26 MED ORDER — GABAPENTIN 100 MG PO CAPS
100.0000 mg | ORAL_CAPSULE | Freq: Two times a day (BID) | ORAL | Status: DC
  Administered 2017-04-27 – 2017-04-30 (×7): 100 mg via ORAL
  Filled 2017-04-26 (×7): qty 1
  Filled 2017-04-26 (×2): qty 14
  Filled 2017-04-26 (×2): qty 1

## 2017-04-26 MED ORDER — ESCITALOPRAM OXALATE 5 MG PO TABS
5.0000 mg | ORAL_TABLET | Freq: Every day | ORAL | Status: DC
  Administered 2017-04-27 – 2017-04-30 (×4): 5 mg via ORAL
  Filled 2017-04-26 (×2): qty 1
  Filled 2017-04-26: qty 7
  Filled 2017-04-26 (×3): qty 1

## 2017-04-26 NOTE — BHH Suicide Risk Assessment (Signed)
[facility] INPATIENT:  Family/Significant Other Suicide Prevention Education  Suicide Prevention Education:  Patient Refusal for Family/Significant Other Suicide Prevention Education: The patient Gabriel Ashley has refused to provide written consent for family/significant other to be provided Family/Significant Other Suicide Prevention Education during admission and/or prior to discharge.  Physician notified. SPE reviewed with patient and brochure provided. Patient encouraged to return to hospital if having suicidal thoughts, patient verbalized his/her understanding and has no further questions at this time.   [name] 04/26/2017, 8:47 AM
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
## 2017-04-26 NOTE — BHH Group Notes (Signed)
[facility] LCSW Group Therapy 04/26/2017 1:15pm  Type of Therapy: Group Therapy- Balance in Life  Participation Level: Active   Description of the Group:  The topic for group was balance in life. Today's group focused on defining balance in one's own words, identifying things that can knock one off balance, and exploring healthy ways to maintain balance in life. Group members were asked to provide an example of a time when they felt off balance, describe how they handled that situation,and process healthier ways to regain balance in the future. Group members were asked to share the most important tool for maintaining balance that they learned while at [facility] and how they plan to apply this method after discharge.  Summary of Patient Progress Pt was active in discussion and identified that he needs to change his friend group as they often take rather than support him. Pt expressed excitement related to starting school next week. He reports feeling that he is being productive and moving forward.     Therapeutic Modalities:   Cognitive Behavioral Therapy Solution-Focused Therapy Assertiveness Training   Adriana Reams, LCSW 04/26/2017 2:46 PM

## 2017-04-26 NOTE — H&P (Addendum)
Psychiatric Admission Assessment Adult  Patient Identification: Gabriel Ashley MRN:  [id number] Date of Evaluation:  04/26/2017 Chief Complaint:  " I relapsed, was feeling bad" Principal Diagnosis:  Major Depression, No Psychotic Features, PTSD by history, Cocaine Use Disorder  Diagnosis:   Patient Active Problem List   Diagnosis Date Noted  . MDD (major depressive disorder), recurrent episode ([place]) [F33.9] 04/25/2017  . Acute renal failure (ARF) ([place]) [N17.9] 04/20/2017  . Hypertensive urgency [I16.0] 04/20/2017  . Acute congestive heart failure with left ventricular diastolic dysfunction ([place]) [I50.31] 12/04/2016  . MDD (major depressive disorder), recurrent severe, without psychosis ([place]) [F33.2] 02/13/2015  . Essential hypertension [I10]   . Substance abuse [F19.10] 10/26/2014  . Substance induced mood disorder ([place]) [F19.94]   . Major depressive disorder, recurrent, severe without psychotic features ([place]) [F33.2]   . Severe major depression ([place]) [F32.2] 10/23/2014  . PTSD (post-traumatic stress disorder) [F43.10]   . Essential hypertension, benign [I10] 05/11/2014  . Elevated serum creatinine [R79.89] 05/08/2014  . Depressive disorder [F32.9] 05/07/2014  . Suicidal ideation [R45.851] 05/07/2014  . Homicidal ideation [R45.850] 05/07/2014  . Anxiety [F41.9] 05/07/2014  . Severe recurrent major depression without psychotic features [facility]) [F33.2] 05/07/2014   History of Present Illness: 51 year old male, presented to ED on 8/3 for depression, suicidal ideations without plan. He attributes this at least partially to recent relapse on crack cocaine ( had used x 2-3 days , following a period of several months of sobriety). He was admitted to [facility] due to uncontrolled hypertension. He was treated , medically stabilized and transferred to inpatient psychiatric unit 8/8. Patient states he has improved since admission and in general reports improving mood and decreased severity of  depression. Reports he is now feeling better, less depressed , and denies current suicidal ideations.   Associated Signs/Symptoms: Depression Symptoms:  Reports improving neuro-vegetative symptoms - improved energy, improved sleep.  (Hypo) Manic Symptoms:  Denies  Anxiety Symptoms: decreased anxiety Psychotic Symptoms:  Denies  PTSD Symptoms: reports history of PTSD related to combat experiences   Total Time spent with patient: 45 minutes  Past Psychiatric History: history of prior psychiatric admissions, last one 2016.Has been diagnosed with MDD and with PTSD in the past . States he has never attempted suicide, denies history of psychosis, denies history of mania, denies history of  violence . He reports history of PTSD related to combat exposure ( as a [employer])   Is the patient at risk to self? Yes.    Has the patient been a risk to self in the past 6 months? Yes.    Has the patient been a risk to self within the distant past? No.  Is the patient a risk to others? No.  Has the patient been a risk to others in the past 6 months? No.  Has the patient been a risk to others within the distant past? No.   Prior Inpatient Therapy:   as above  Prior Outpatient Therapy:  had been following up at [facility]  Alcohol Screening: 1. How often do you have a drink containing alcohol?: Never 9. Have you or someone else been injured as a result of your drinking?: No 10. Has a relative or friend or a doctor or another health worker been concerned about your drinking or suggested you cut down?: No Alcohol Use Disorder Identification Test Final Score (AUDIT): 0 Brief Intervention: AUDIT score less than 7 or less-screening does not suggest unhealthy drinking-brief intervention not indicated Substance Abuse History in  the last 12 months: reports history of alcohol use disorder, but not in " a long time". History of cocaine use disorder, states he was sober for months, relapsed x 2-3 days prior to admission   Consequences of Substance Abuse: Medical issues, financial losses  Previous Psychotropic Medications: in the past has been on Wellbutrin and Seroquel. States " I don't like the Seroquel- it makes me feel weird"  Psychological Evaluations: Denies  Past Medical History: history of HTN, history of ARF  Past Medical History:  Diagnosis Date  . Anxiety   . Asthma   . Hypertension   . Post traumatic stress disorder (PTSD)    History reviewed. No pertinent surgical history. Family History:  Parents are deceased, has one sister, brother who died of cancer Family Psychiatric  History:  Denies history of mental illness in family, no suicides in family , states there is a history of alcohol use disorder in family  Tobacco Screening: Have you used any form of tobacco in the last 30 days? (Cigarettes, Smokeless Tobacco, Cigars, and/or Pipes): Yes Tobacco use, Select all that apply: 5 or more cigarettes per day Are you interested in Tobacco Cessation Medications?: Yes, will notify MD for an order Counseled patient on smoking cessation including recognizing danger situations, developing coping skills and basic information about quitting provided: Yes Social History: divorced, has one adult son, lives alone , on disability. No legal issues . History  Alcohol Use  . Yes    Comment: 2 40 oz beers last night     History  Drug Use  . Types: Cocaine, Marijuana    Additional Social History:  Allergies:  No Known Allergies Lab Results: No results found for this or any previous visit (from the past 48 hour(s)).  Blood Alcohol level:  Lab Results  Component Value Date   ETH <5 04/20/2017   ETH <5 64/40/3474    Metabolic Disorder Labs:  No results found for: HGBA1C, MPG No results found for: PROLACTIN No results found for: CHOL, TRIG, HDL, CHOLHDL, VLDL, LDLCALC  Current Medications: Current Facility-Administered Medications  Medication Dose Route Frequency Provider Last Rate Last Dose  .  acetaminophen (TYLENOL) tablet 650 mg  650 mg Oral Q6H PRN Okonkwo, Justina A, NP   650 mg at 04/26/17 1304  . albuterol (PROVENTIL) (2.5 MG/3ML) 0.083% nebulizer solution 3 mL  3 mL Inhalation Q6H PRN Okonkwo, Justina A, NP      . amLODipine (NORVASC) tablet 10 mg  10 mg Oral Daily Okonkwo, Justina A, NP   10 mg at 04/26/17 0826  . aspirin EC tablet 81 mg  81 mg Oral Daily Okonkwo, Justina A, NP   81 mg at 04/26/17 0827  . buPROPion [facility]) tablet 75 mg  75 mg Oral Daily Okonkwo, Justina A, NP   75 mg at 04/26/17 0827  . cloNIDine (CATAPRES) tablet 0.2 mg  0.2 mg Oral BID Okonkwo, Justina A, NP   0.2 mg at 04/26/17 1722  . docusate sodium (COLACE) capsule 100 mg  100 mg Oral Daily PRN Okonkwo, Justina A, NP      . folic acid (FOLVITE) tablet 1 mg  1 mg Oral Daily Okonkwo, Justina A, NP   1 mg at 04/26/17 0827  . gabapentin (NEURONTIN) capsule 200 mg  200 mg Oral BID Okonkwo, Justina A, NP   200 mg at 04/26/17 1722  . hydrALAZINE (APRESOLINE) tablet 25 mg  25 mg Oral TID Hughie Closs A, NP   25 mg at 04/26/17  1722  . hydrOXYzine (ATARAX/VISTARIL) tablet 25 mg  25 mg Oral TID PRN Lu Duffel, Justina A, NP   25 mg at 04/25/17 2209  . magnesium hydroxide (MILK OF MAGNESIA) suspension 30 mL  30 mL Oral Daily PRN Okonkwo, Justina A, NP      . multivitamin with minerals tablet 1 tablet  1 tablet Oral Daily Okonkwo, Justina A, NP   1 tablet at 04/26/17 0827  . nicotine (NICODERM CQ - dosed in mg/24 hours) patch 21 mg  21 mg Transdermal Daily Patriciaann Clan E, PA-C   21 mg at 04/26/17 1610  . polyethylene glycol (MIRALAX / GLYCOLAX) packet 17 g  17 g Oral Daily Okonkwo, Justina A, NP   17 g at 04/26/17 0827  . traZODone (DESYREL) tablet 50 mg  50 mg Oral QHS PRN Lu Duffel, Justina A, NP   50 mg at 04/25/17 2209   PTA Medications: Prescriptions Prior to Admission  Medication Sig Dispense Refill Last Dose  . albuterol (PROVENTIL HFA;VENTOLIN HFA) 108 (90 Base) MCG/ACT inhaler Inhale 2 puffs into the  lungs every 6 (six) hours as needed for wheezing or shortness of breath.   unknown  . amLODipine (NORVASC) 10 MG tablet Take 1 tablet (10 mg total) by mouth daily. 30 tablet 3 Past Week at Unknown time  . buPROPion (WELLBUTRIN) 75 MG tablet Take 75 mg by mouth daily.   Past Week at Unknown time  . cloNIDine (CATAPRES) 0.2 MG tablet Take 1 tablet (0.2 mg total) by mouth 2 (two) times daily. 60 tablet 3 Past Week at Unknown time  . furosemide (LASIX) 40 MG tablet Take 1 tablet (40 mg total) by mouth 2 (two) times daily. 60 tablet 3 Past Week at Unknown time  . hydrALAZINE (APRESOLINE) 50 MG tablet Take 0.5 tablets (25 mg total) by mouth 2 (two) times daily. 60 tablet 3 Past Week at Unknown time  . Multiple Vitamin (MULTIVITAMIN) tablet Take 1 tablet by mouth daily.   Past Week at Unknown time  . QUEtiapine (SEROQUEL) 100 MG tablet Take 50 mg by mouth at bedtime.   Past Week at Unknown time    Musculoskeletal: Strength & Muscle Tone: within normal limits Gait & Station: normal Patient leans: N/A  Psychiatric Specialty Exam: Physical Exam  Review of Systems  Constitutional: Negative.   HENT: Negative.   Eyes: Negative.   Respiratory: Negative.   Cardiovascular: Negative.  Negative for chest pain.  Gastrointestinal: Negative.   Genitourinary: Negative.   Musculoskeletal: Negative.   Skin: Negative.   Neurological: Negative for seizures and headaches.  Endo/Heme/Allergies: Negative.   Psychiatric/Behavioral: Positive for depression and substance abuse.  All other systems reviewed and are negative.   Blood pressure (!) 150/80, pulse 76, temperature 98.1 F (36.7 C), temperature source Oral, resp. rate 16, height 6' (1.829 m), weight 95.3 kg (210 lb).Body mass index is 28.48 kg/m.  General Appearance: Well Groomed  Eye Contact:  Good  Speech:  Normal Rate  Volume:  Normal  Mood:  reports he is feeling better , less depressed   Affect:  mildly constricted, reactive  Thought Process:   Linear and Descriptions of Associations: Intact  Orientation:  Full (Time, Place, and Person)  Thought Content:  denies hallucinations, no delusions   Suicidal Thoughts:  No denies suicidal or self injurious ideations, denies homicidal or violent ideations, contracts for safety on unit   Homicidal Thoughts:  No  Memory:  recent and remote grossly intact   Judgement:  Fair  Insight:  Fair  Psychomotor Activity:  Normal  Concentration:  Concentration: Good and Attention Span: Good  Recall:  Good  Fund of Knowledge:  Good  Language:  Good  Akathisia:  Negative  Handed:  Right  AIMS (if indicated):     Assets:  Communication Skills Desire for Improvement Resilience  ADL's:  Intact  Cognition:  WNL  Sleep:  Number of Hours: 6.25    Treatment Plan Summary: Daily contact with patient to assess and evaluate symptoms and progress in treatment, Medication management, Plan inpatient admission and medications as below  Observation Level/Precautions:  15 minute checks  Laboratory:  as needed  repeat BMP in AM  Psychotherapy:  Milieu, group therapy   Medications:  We discussed options- due to history of HTN and Cocaine Use Disorder, we decided to discontinue Wellbutrin Start Lexapro 5 mgrs QDAY  Would taper down Neurontin based on history of kidney disease- patient denies chronic pain issues a this time Decrease Neurontin to 100 mgrs BID  Consultations:  As needed   Discharge Concerns: -   Estimated LOS: 4 days   Other:     Physician Treatment Plan for Primary Diagnosis: Substance Induced Depression Long Term Goal(s): Improvement in symptoms so as ready for discharge  Short Term Goals: Ability to identify changes in lifestyle to reduce recurrence of condition will improve, Ability to disclose and discuss suicidal ideas, Ability to demonstrate self-control will improve, Ability to identify and develop effective coping behaviors will improve, Ability to maintain clinical measurements within  normal limits will improve, Compliance with prescribed medications will improve and Ability to identify triggers associated with substance abuse/mental health issues will improve  Physician Treatment Plan for Secondary Diagnosis: Cocaine Use Disorder  Long Term Goal(s): Improvement in symptoms so as ready for discharge  Short Term Goals: Ability to identify triggers associated with substance abuse/mental health issues will improve  I certify that inpatient services furnished can reasonably be expected to improve the patient's condition.    Jenne Campus, MD 8/9/20185:36 PM

## 2017-04-26 NOTE — Progress Notes (Signed)
DAR NOTE Pt present with bright affect and jovial mood on the unit. Pt has been observed in the milieu interacting well with both staff and peers. Pt appeared flat and depressed on first approach this morning, that changed during the course of the day. Pt took his medication as prescribed, denied physical pain.  As per self inventory, pt had a fair night sleep, good appetite, normal energy, and poor concentration. Pt rate depression at 05, hopeless ness at 04, and anxiety at 07. Pt's safety ensured with 15 minute and environmental checks. Pt currently denies SI/HI and A/V hallucinations. Pt verbally agrees to seek staff if SI/HI or A/VH occurs and to consult with staff before acting on these thoughts. Will continue POC.

## 2017-04-26 NOTE — Progress Notes (Signed)
Adult Psychoeducational Group Note  Date:  04/26/2017 Time:  11:10 PM  Group Topic/Focus:  Wrap-Up Group:   The focus of this group is to help patients review their daily goal of treatment and discuss progress on daily workbooks.  Participation Level:  Active  Participation Quality:  Attentive  Affect:  Appropriate  Cognitive:  Alert  Insight: Good  Engagement in Group:  Engaged  Modes of Intervention:  Discussion, Education and Support  Additional Comments:  Pt attended this evenings wrap up group. Pt stated his day started at a 2 because his medicine made him feel sluggish. Pt said his day got better and rated it a 10 out of 10. 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   Gabriel Ashley 04/26/2017, 11:10 PM

## 2017-04-27 LAB — BASIC METABOLIC PANEL
Anion gap: 9 (ref 5–15)
BUN: 40 mg/dL — ABNORMAL HIGH (ref 6–20)
CALCIUM: 8.8 mg/dL — AB (ref 8.9–10.3)
CHLORIDE: 109 mmol/L (ref 101–111)
CO2: 24 mmol/L (ref 22–32)
CREATININE: 3.71 mg/dL — AB (ref 0.61–1.24)
GFR, EST AFRICAN AMERICAN: 20 mL/min — AB (ref >60)
GFR, EST NON AFRICAN AMERICAN: 17 mL/min — AB (ref >60)
Glucose, Bld: 84 mg/dL (ref 65–99)
Potassium: 4.6 mmol/L (ref 3.5–5.1)
SODIUM: 142 mmol/L (ref 135–145)

## 2017-04-27 NOTE — Tx Team (Signed)
Interdisciplinary Treatment and Diagnostic Plan Update  04/27/2017 Time of Session: 9:30am Gabriel Ashley MRN: [id number]  Principal Diagnosis: Major Depression, No Psychotic Features, PTSD by history, Cocaine Use Disorder   Secondary Diagnoses: Active Problems:   MDD (major depressive disorder), recurrent episode ([place])   Current Medications:  Current Facility-Administered Medications  Medication Dose Route Frequency Provider Last Rate Last Dose  . acetaminophen (TYLENOL) tablet 650 mg  650 mg Oral Q6H PRN Okonkwo, Justina A, NP   650 mg at 04/26/17 1304  . albuterol (PROVENTIL) (2.5 MG/3ML) 0.083% nebulizer solution 3 mL  3 mL Inhalation Q6H PRN Okonkwo, Justina A, NP      . amLODipine (NORVASC) tablet 10 mg  10 mg Oral Daily Okonkwo, Justina A, NP   10 mg at 04/26/17 0826  . aspirin EC tablet 81 mg  81 mg Oral Daily Okonkwo, Justina A, NP   81 mg at 04/26/17 0827  . cloNIDine (CATAPRES) tablet 0.2 mg  0.2 mg Oral BID Okonkwo, Justina A, NP   0.2 mg at 04/26/17 1722  . docusate sodium (COLACE) capsule 100 mg  100 mg Oral Daily PRN Okonkwo, Justina A, NP      . escitalopram (LEXAPRO) tablet 5 mg  5 mg Oral Daily Cobos, Fernando A, MD      . folic acid (FOLVITE) tablet 1 mg  1 mg Oral Daily Okonkwo, Justina A, NP   1 mg at 04/26/17 0827  . gabapentin (NEURONTIN) capsule 100 mg  100 mg Oral BID Cobos, Fernando A, MD      . hydrALAZINE (APRESOLINE) tablet 25 mg  25 mg Oral TID Lu Duffel, Justina A, NP   25 mg at 04/26/17 1722  . hydrOXYzine (ATARAX/VISTARIL) tablet 25 mg  25 mg Oral TID PRN Hughie Closs A, NP   25 mg at 04/25/17 2209  . magnesium hydroxide (MILK OF MAGNESIA) suspension 30 mL  30 mL Oral Daily PRN Okonkwo, Justina A, NP      . multivitamin with minerals tablet 1 tablet  1 tablet Oral Daily Okonkwo, Justina A, NP   1 tablet at 04/26/17 0827  . nicotine (NICODERM CQ - dosed in mg/24 hours) patch 21 mg  21 mg Transdermal Daily Patriciaann Clan E, PA-C   21 mg at 04/26/17 5681  .  polyethylene glycol (MIRALAX / GLYCOLAX) packet 17 g  17 g Oral Daily Okonkwo, Justina A, NP   17 g at 04/27/17 0836  . traZODone (DESYREL) tablet 50 mg  50 mg Oral QHS PRN Okonkwo, Justina A, NP   50 mg at 04/26/17 2305    PTA Medications: Prescriptions Prior to Admission  Medication Sig Dispense Refill Last Dose  . albuterol (PROVENTIL HFA;VENTOLIN HFA) 108 (90 Base) MCG/ACT inhaler Inhale 2 puffs into the lungs every 6 (six) hours as needed for wheezing or shortness of breath.   unknown  . amLODipine (NORVASC) 10 MG tablet Take 1 tablet (10 mg total) by mouth daily. 30 tablet 3 Past Week at Unknown time  . buPROPion (WELLBUTRIN) 75 MG tablet Take 75 mg by mouth daily.   Past Week at Unknown time  . cloNIDine (CATAPRES) 0.2 MG tablet Take 1 tablet (0.2 mg total) by mouth 2 (two) times daily. 60 tablet 3 Past Week at Unknown time  . furosemide (LASIX) 40 MG tablet Take 1 tablet (40 mg total) by mouth 2 (two) times daily. 60 tablet 3 Past Week at Unknown time  . hydrALAZINE (APRESOLINE) 50 MG tablet Take 0.5 tablets (25 mg  total) by mouth 2 (two) times daily. 60 tablet 3 Past Week at Unknown time  . Multiple Vitamin (MULTIVITAMIN) tablet Take 1 tablet by mouth daily.   Past Week at Unknown time  . QUEtiapine (SEROQUEL) 100 MG tablet Take 50 mg by mouth at bedtime.   Past Week at Unknown time    Treatment Modalities: Medication Management, Group therapy, Case management,  1 to 1 session with clinician, Psychoeducation, Recreational therapy.  Patient Stressors: Financial difficulties Occupational concerns Substance abuse  Patient Strengths: Ability for insight Active sense of humor Average or above average intelligence Capable of independent living Curator fund of knowledge Motivation for treatment/growth Supportive family/friends  Physician Treatment Plan for Primary Diagnosis: Major Depression, No Psychotic Features, PTSD by history, Cocaine Use Disorder  Long  Term Goal(s): Improvement in symptoms so as ready for discharge  Short Term Goals: Ability to identify changes in lifestyle to reduce recurrence of condition will improve Ability to disclose and discuss suicidal ideas Ability to demonstrate self-control will improve Ability to identify and develop effective coping behaviors will improve Ability to maintain clinical measurements within normal limits will improve Compliance with prescribed medications will improve Ability to identify triggers associated with substance abuse/mental health issues will improve Ability to identify triggers associated with substance abuse/mental health issues will improve  Medication Management: Evaluate patient's response, side effects, and tolerance of medication regimen.  Therapeutic Interventions: 1 to 1 sessions, Unit Group sessions and Medication administration.  Evaluation of Outcomes: Progressing  Physician Treatment Plan for Secondary Diagnosis: Active Problems:   MDD (major depressive disorder), recurrent episode ([place])   Long Term Goal(s): Improvement in symptoms so as ready for discharge  Short Term Goals: Ability to identify changes in lifestyle to reduce recurrence of condition will improve Ability to disclose and discuss suicidal ideas Ability to demonstrate self-control will improve Ability to identify and develop effective coping behaviors will improve Ability to maintain clinical measurements within normal limits will improve Compliance with prescribed medications will improve Ability to identify triggers associated with substance abuse/mental health issues will improve Ability to identify triggers associated with substance abuse/mental health issues will improve  Medication Management: Evaluate patient's response, side effects, and tolerance of medication regimen.  Therapeutic Interventions: 1 to 1 sessions, Unit Group sessions and Medication administration.  Evaluation of Outcomes:  Progressing   RN Treatment Plan for Primary Diagnosis: Major Depression, No Psychotic Features, PTSD by history, Cocaine Use Disorder  Long Term Goal(s): Knowledge of disease and therapeutic regimen to maintain health will improve  Short Term Goals: Ability to verbalize frustration and anger appropriately will improve, Ability to participate in decision making will improve and Ability to identify and develop effective coping behaviors will improve  Medication Management: RN will administer medications as ordered by provider, will assess and evaluate patient's response and provide education to patient for prescribed medication. RN will report any adverse and/or side effects to prescribing provider.  Therapeutic Interventions: 1 on 1 counseling sessions, Psychoeducation, Medication administration, Evaluate responses to treatment, Monitor vital signs and CBGs as ordered, Perform/monitor CIWA, COWS, AIMS and Fall Risk screenings as ordered, Perform wound care treatments as ordered.  Evaluation of Outcomes: Progressing   LCSW Treatment Plan for Primary Diagnosis: Major Depression, No Psychotic Features, PTSD by history, Cocaine Use Disorder  Long Term Goal(s): Safe transition to appropriate next level of care at discharge, Engage patient in therapeutic group addressing interpersonal concerns.  Short Term Goals: Engage patient in aftercare planning with referrals and resources, Identify  triggers associated with mental health/substance abuse issues and Increase skills for wellness and recovery  Therapeutic Interventions: Assess for all discharge needs, 1 to 1 time with Social worker, Explore available resources and support systems, Assess for adequacy in community support network, Educate family and significant other(s) on suicide prevention, Complete Psychosocial Assessment, Interpersonal group therapy.  Evaluation of Outcomes: Progressing   Progress in Treatment: Attending groups:  Yes Participating in groups: Yes  Taking medication as prescribed: Yes, MD continues to assess for medication changes as needed Toleration medication: Yes, no side effects reported at this time Family/Significant other contact made: No, Pt declines Patient understands diagnosis: Yes AEB participation in group and ability to identify goals Discussing patient identified problems/goals with staff: Yes Medical problems stabilized or resolved: Yes Denies suicidal/homicidal ideation: Yes Issues/concerns per patient self-inventory: None Other: N/A  New problem(s) identified: None identified at this time.   New Short Term/Long Term Goal(s): "work on my sobriety plan"  Discharge Plan or Barriers: Pt will return home and follow-up with outpatient services at the [place]. Pt requests to make his own appointments  Reason for Continuation of Hospitalization: Anxiety Depression Medication stabilization  Estimated Length of Stay: 3-5 days; est DC date 8/14  Attendees: Patient: Gabriel Ashley  04/27/2017  8:37 AM  Physician: Dr. Parke Poisson 04/27/2017  8:37 AM  Nursing: Opal Sidles, RN 04/27/2017  8:37 AM  RN Care Manager: Lars Pinks, RN 04/27/2017  8:37 AM  Social Worker: Adriana Reams, LCSW 04/27/2017  8:37 AM  Recreational Therapist:  04/27/2017  8:37 AM  Other: Lindell Spar, NP; Marvia Pickles, NP 04/27/2017  8:37 AM  Other:  04/27/2017  8:37 AM  Other: 04/27/2017  8:37 AM    Scribe for Treatment Team: [name], LCSW 04/27/2017 8:37 AM

## 2017-04-27 NOTE — Progress Notes (Signed)
  D: When asked about his day, pt stated, "pretty good". Writer discussed the information given by previous nurse. Pt agreed. When asked if he had any questions or concerns, pt stated, "I don't now, but I probably will later", as he smiled and walked off. At med pass the pt approached the writer as if he was Microbiologist for the first time. Pt appeared to be happy, smiling and asked the writer how she was doing". Conversation almost took an Tax inspector, Tour manager redirected pt back to himself. Pt has no questions or concerns.    A:  Support and encouragement was offered. 15 min checks continued for safety.  R: Pt remains safe.

## 2017-04-27 NOTE — [facility] Group Notes (Signed)
[place] LCSW Group Therapy 04/27/2017 1:15pm  Type of Therapy: Group Therapy- Feelings Around Relapse and Recovery  Participation Level: Active   Participation Quality:  Appropriate  Affect:  Appropriate  Cognitive: Alert and Oriented   Insight:  Developing   Engagement in Therapy: Developing/Improving and Engaged   Modes of Intervention: Clarification, Confrontation, Discussion, Education, Exploration, Limit-setting, Orientation, Problem-solving, Rapport Building, Art therapist, Socialization and Support  Summary of Progress/Problems: The topic for today was feelings about relapse. The group discussed what relapse prevention is to them and identified triggers that they are on the path to relapse. Members also processed their feeling towards relapse and were able to relate to common experiences. Group also discussed coping skills that can be used for relapse prevention.  Pt continues to be active in group discussion and was able identify motivation and triggers related to his relapse prevention plan. Pt describes being motivated by a desire to leave a better legacy and impact people's lives positively. He identified seeing people that use crack as a trigger for him.    Therapeutic Modalities:   Cognitive Behavioral Therapy Solution-Focused Therapy Assertiveness Training Relapse Prevention Therapy    Adriana Reams, LCSW [phone] 04/27/2017 3:30 PM

## 2017-04-27 NOTE — Progress Notes (Signed)
Adult Psychoeducational Group Note  Date:  04/27/2017 Time:  10:47 PM  Group Topic/Focus:  Wrap-Up Group:   The focus of this group is to help patients review their daily goal of treatment and discuss progress on daily workbooks.  Participation Level:  Active  Participation Quality:  Appropriate and Attentive  Affect:  Appropriate  Cognitive:  Alert, Appropriate and Oriented  Insight: Appropriate  Engagement in Group:  Engaged  Modes of Intervention:  Discussion and Education  Additional Comments:  Pt attended and participated in group. Pt stated his goal today was to work on his sobriety. Pt reported completing his goal and rated his day a 8/10.   Milus Glazier 04/27/2017, 10:47 PM

## 2017-04-27 NOTE — Progress Notes (Signed)
[facility] MD Progress Note  04/27/2017 3:41 PM Gabriel Ashley  MRN:  [id number] Subjective:  Patient reports he is feeling " all right ". At this time denies suicidal ideations . Denies medication side effects. Objective : I have discussed case with treatment team and have met with patient. He presents with improving mood and a fuller range of affect. At this time denies suicidal ideations and does not endorse significant neuro-vegetative symptoms. He is tolerating medications well . No disruptive or agitated behaviors on unit, visible in day room, going to some groups. Labs reviewed- Creatinine gradually trending down ( 3.71 today) , GFR improved to 20. We spoke at more length about cocaine abuse and the negative impact is has had on his physical and mental health. Patient is expressing an improved insight into the dangers associated with cocaine and its likely role in worsening BP and kidney related issues . States " I know I can't use anymore". Denies cravings .    Principal Problem:  Substance Induced Mood Disorder  Diagnosis:   Patient Active Problem List   Diagnosis Date Noted  . MDD (major depressive disorder), recurrent episode ([place]) [F33.9] 04/25/2017  . Acute renal failure (ARF) ([place]) [N17.9] 04/20/2017  . Hypertensive urgency [I16.0] 04/20/2017  . Acute congestive heart failure with left ventricular diastolic dysfunction ([place]) [I50.31] 12/04/2016  . MDD (major depressive disorder), recurrent severe, without psychosis ([place]) [F33.2] 02/13/2015  . Essential hypertension [I10]   . Substance abuse [F19.10] 10/26/2014  . Substance induced mood disorder ([place]) [F19.94]   . Major depressive disorder, recurrent, severe without psychotic features ([place]) [F33.2]   . Severe major depression ([place]) [F32.2] 10/23/2014  . PTSD (post-traumatic stress disorder) [F43.10]   . Essential hypertension, benign [I10] 05/11/2014  . Elevated serum creatinine [R79.89] 05/08/2014  . Depressive disorder [F32.9]  05/07/2014  . Suicidal ideation [R45.851] 05/07/2014  . Homicidal ideation [R45.850] 05/07/2014  . Anxiety [F41.9] 05/07/2014  . Severe recurrent major depression without psychotic features ([place]) [F33.2] 05/07/2014   Total Time spent with patient: 20 minutes  Past Medical History:  Past Medical History:  Diagnosis Date  . Anxiety   . Asthma   . Hypertension   . Post traumatic stress disorder (PTSD)    History reviewed. No pertinent surgical history. Family History: History reviewed. No pertinent family history. Social History:  History  Alcohol Use  . Yes    Comment: 2 40 oz beers last night     History  Drug Use  . Types: Cocaine, [name], Marijuana  . Sexual activity: Not Asked   Other Topics Concern  . None   Social History Narrative  . None   Additional Social History:   Sleep: Fair  Appetite:  improving   Current Medications: Current Facility-Administered Medications  Medication Dose Route Frequency Provider Last Rate Last Dose  . acetaminophen (TYLENOL) tablet 650 mg  650 mg Oral Q6H PRN Okonkwo, Justina A, NP   650 mg at 04/26/17 1304  . albuterol (PROVENTIL) (2.5  MG/3ML) 0.083% nebulizer solution 3 mL  3 mL Inhalation Q6H PRN Okonkwo, Justina A, NP      . amLODipine (NORVASC) tablet 10 mg  10 mg Oral Daily Okonkwo, Justina A, NP   10 mg at 04/27/17 0837  . aspirin EC tablet 81 mg  81 mg Oral Daily Okonkwo, Justina A, NP   81 mg at 04/27/17 0837  . cloNIDine (CATAPRES) tablet 0.2 mg  0.2 mg Oral BID Okonkwo, Justina A, NP   0.2 mg at 04/27/17 0837  . docusate sodium (COLACE)  capsule 100 mg  100 mg Oral Daily PRN Okonkwo, Justina A, NP      . escitalopram (LEXAPRO) tablet 5 mg  5 mg Oral Daily Cobos, Myer Peer, MD   5 mg at 04/27/17 0837  . folic acid (FOLVITE) tablet 1 mg  1 mg Oral Daily Okonkwo, Justina A, NP   1 mg at 04/27/17 0837  . gabapentin (NEURONTIN) capsule 100 mg  100 mg Oral BID Cobos, Myer Peer, MD   100 mg at 04/27/17 6269  . hydrALAZINE (APRESOLINE) tablet 25 mg  25 mg Oral TID Hughie Closs A, NP   25 mg at 04/27/17 1210  . hydrOXYzine (ATARAX/VISTARIL) tablet 25 mg  25 mg Oral TID PRN Hughie Closs A, NP   25 mg at 04/25/17 2209  . magnesium hydroxide (MILK OF MAGNESIA) suspension 30 mL  30 mL Oral Daily PRN Okonkwo, Justina A, NP      . multivitamin with minerals tablet 1 tablet  1 tablet Oral Daily Okonkwo, Justina A, NP   1 tablet at 04/27/17 0837  . nicotine (NICODERM CQ - dosed in mg/24 hours) patch 21 mg  21 mg Transdermal Daily Patriciaann Clan E, PA-C   21 mg at 04/27/17 4854  . polyethylene glycol (MIRALAX / GLYCOLAX) packet 17 g  17 g Oral Daily Okonkwo, Justina A, NP   17 g at 04/27/17 0836  . traZODone (DESYREL) tablet 50 mg  50 mg Oral QHS PRN Lu Duffel, Justina A, NP   50 mg at 04/26/17 2305    Lab Results:  Results for orders placed or performed during the hospital encounter of 04/25/17 (from the past 48 hour(s))  Basic metabolic panel     Status: Abnormal   Collection Time: 04/27/17  6:47 AM  Result Value Ref Range   Sodium 142 135 - 145 mmol/L   Potassium 4.6 3.5 - 5.1 mmol/L   Chloride 109 101 - 111 mmol/L   CO2 24 22 - 32 mmol/L   Glucose, Bld 84 65 - 99 mg/dL   BUN 40 (H) 6 - 20 mg/dL   Creatinine, Ser 3.71 (H) 0.61 - 1.24 mg/dL   Calcium 8.8 (L) 8.9 - 10.3 mg/dL   GFR calc non Af Amer 17 (L) >60 mL/min   GFR calc Af Amer 20 (L) >60 mL/min    Comment: (NOTE) The eGFR has been calculated using the CKD EPI equation. This calculation has not been validated in all clinical situations. eGFR's persistently <60 mL/min  signify possible Chronic Kidney Disease.    Anion gap 9 5 - 15    Comment: Performed at [facility], [place] [address], [place], [place] [address]    Blood Alcohol level:  Lab Results  Component Value Date   ETH <5 04/20/2017   ETH <5 50/05/3817    Metabolic Disorder Labs: No results found for: HGBA1C, MPG No results found for: PROLACTIN No results found for: CHOL, TRIG, HDL,  CHOLHDL, VLDL, LDLCALC  Physical Findings: AIMS: Facial and Oral Movements Muscles of Facial Expression: None, normal Lips and Perioral Area: None, normal Jaw: None, normal Tongue: None, normal,Extremity Movements Upper (arms, wrists, hands, fingers): None, normal Lower (legs, knees, ankles, toes): None, normal, Trunk Movements Neck, shoulders, hips: None, normal, Overall Severity Severity of abnormal movements (highest score from questions above): None, normal Incapacitation due to abnormal movements: None, normal Patient's awareness of abnormal movements (rate only patient's report): No Awareness, Dental Status Current problems with teeth and/or dentures?: No Does patient usually wear dentures?: No  CIWA:    COWS:     Musculoskeletal: Strength & Muscle Tone: within normal limits Gait & Station: normal Patient leans: N/A  Psychiatric Specialty Exam: Physical Exam  ROS denies headache, denies chest pain, no shortness of breath, no vomiting   Blood pressure (!) 147/92, pulse 76, temperature 97.6 F (36.4 C), temperature source Oral, resp. rate 16, height 6' (1.829 m), weight 95.3 kg (210 lb).Body mass index is 28.48 kg/m.  General Appearance: improved grooming   Eye Contact:  Good  Speech:  Normal Rate  Volume:  Normal  Mood:  improved , acknowledges feeling better   Affect:  Appropriate and more reactive   Thought Process:  Linear and Descriptions of Associations: Intact  Orientation:  Full (Time, Place, and Person)  Thought Content:  no hallucinations, no delusions, not  internally preoccupied   Suicidal Thoughts:  No denies suicidal or self injurious ideations, denies any homicidal or violent ideations   Homicidal Thoughts:  No  Memory:  recent and remote grossly intact   Judgement:  Other:  improving  Insight:  improving   Psychomotor Activity:  Normal  Concentration:  Concentration: Good and Attention Span: Good  Recall:  Good  Fund of Knowledge:  Good  Language:  Good  Akathisia:  Negative  Handed:  Right  AIMS (if indicated):     Assets:  Communication Skills Desire for Improvement Resilience  ADL's:  Intact  Cognition:  WNL  Sleep:  Number of Hours: 4.5   Assessment - patient presents with improving mood , and at this time does not endorse severe neuro-vegetative symptoms of depression and denies suicidal ideations. He is gaining insight regarding negative impact of cocaine abuse, and expresses motivation in sobriety, recovery. Not currently interested in rehab at discharge. Has a history of combat related PTSD but at this time symptoms controlled, and noted to be visible on unit, calm and pleasant on approach, interactive with peers . Creatinine elevated but trending down.  Tolerating Lexapro trial well thus far   Treatment Plan Summary: Daily contact with patient to assess and evaluate symptoms and progress in treatment, Medication management, Plan inpatient treatment and medications as below Encourage group and milieu participation to work on coping skills and symptom reduction Continue to encourage efforts to work on sobriety and relapse prevention efforts Treatment team working on disposition planning options  Continue Neurontin 100 mgrs BID for anxiety, pain Continue Lexapro 5 mgrs QDAY for depression, PTSD history  Continue Trazodone 50 mgrs QHS PRN for insomnia as needed  Continue Vistaril 25 mgrs Q 8 hours PRN for anxiety as needed  Continue antihypertensive management for HTN.  Jenne Campus, MD 04/27/2017, 3:41 PM

## 2017-04-27 NOTE — Progress Notes (Signed)
Recreation Therapy Notes  Date: 04/27/2017 Time: 9:30am Location: [address] Dayroom  Group Topic: Stress Management  Goal Area(s) Addresses:  Patient will verbalize importance of using healthy stress management.  Patient will identify positive emotions associated with healthy stress management.   Intervention: Stress Management  Activity :  Guided Meditation. Recreation Therapy Intern introduced the stress management technique of guided meditation. Recreation Therapy Intern read a script that allowed patients to reach into their inner "chakra." Recreation Therapy Intern played calming meditation music. Patients were to follow along as script was read to engage in the activity.  Education: Stress Management, Discharge Planning.   Education Outcome: Needs additional education  Clinical Observations/Feedback: Pt did not attend group.  Heinrich Fertig, Recreation Therapy Intern  

## 2017-04-27 NOTE — Progress Notes (Signed)
DAR NOTE: Pt present with bright affect and pleasant  mood in the unit. Pt has been visible in the milieu interacting with peers and staff. Pt denies physical pain, took all his meds as scheduled. As per self inventory, pt had a fair night sleep, good appetite, normal energy, and poor concentration. Pt rate depression at 4, hopeless ness at 3, and anxiety at 2. Pt's safety ensured with 15 minute and environmental checks. Pt currently denies SI/HI and A/V hallucinations. Pt verbally agrees to seek staff if SI/HI or A/VH occurs and to consult with staff before acting on these thoughts. Will continue POC.

## 2017-04-27 NOTE — Progress Notes (Signed)
Pt reports that he is feeling well this evening.  He says the medication made him feel drowsy this morning, but he feels fine this evening.  He denies SI/HI/AVH.  He voices no needs or concern.  He has been pleasant and cooperative.  He makes his needs known to staff.  Support and encouragement offered.  Discharge plans are in process.  Safety maintained with q15 minute checks.

## 2017-04-28 DIAGNOSIS — G4700 Insomnia, unspecified: Secondary | ICD-10-CM

## 2017-04-28 DIAGNOSIS — F419 Anxiety disorder, unspecified: Secondary | ICD-10-CM

## 2017-04-28 DIAGNOSIS — F39 Unspecified mood [affective] disorder: Secondary | ICD-10-CM

## 2017-04-28 DIAGNOSIS — F1290 Cannabis use, unspecified, uncomplicated: Secondary | ICD-10-CM

## 2017-04-28 DIAGNOSIS — F339 Major depressive disorder, recurrent, unspecified: Secondary | ICD-10-CM

## 2017-04-28 NOTE — [facility] Group Notes (Signed)
[facility] LCSW Group Therapy Note  Date/Time:    04/28/2017 10:00-11:00AM  Type of Therapy and Topic:  Group Therapy:  Healthy vs Unhealthy Coping Skills  Participation Level:  Active   Description of Group:  The focus of this group was to determine what unhealthy coping techniques typically are used by group members and what healthy coping techniques would be helpful in coping with various problems. Patients were guided in becoming aware of the differences between healthy and unhealthy coping techniques.  Patients were asked to identify 1-2 healthy coping skills they would like to learn to use more effectively, and many mentioned meditation, breathing, and relaxation.  These were explained, samples demonstrated, and resources shared for how to learn more at discharge.   At the end of group, additional ideas of healthy coping skills were shared in a fun exercise.  Therapeutic Goals 1. Patients learned that coping is what human beings do all day long to deal with various situations in their lives 2. Patients defined and discussed healthy vs unhealthy coping techniques 3. Patients identified their preferred coping techniques and identified whether these were healthy or unhealthy 4. Patients determined 1-2 healthy coping skills they would like to become more familiar with and use more often, and practiced a few meditations 5. Patients provided support and ideas to each other  Summary of Patient Progress: During group, patient expressed that right now he exercises and gets out in the community doing various things as a healthy way to cope.  He stated that his exercise is meditative in nature, because he "gets in the zone" and shuts everything else out.  He would like to learn more about "how to relax" and reacted well to the meditation samples.   Therapeutic Modalities Cognitive Behavioral Therapy Motivational Interviewing   Selmer Dominion, LCSW 04/28/2017, 1:16 PM

## 2017-04-28 NOTE — Progress Notes (Signed)
D: Gabriel Ashley has been very quiet today. He forwards little and has slept a lot. He denies SI, HI, and AVH. He didn't go out for rec therapy. He's taken medications as prescribed. He reported poor sleep, good appetite, normal energy level, and poor concentration. He snores loudly. He rated his depression 3/10, anxiety 3/10, feeling of hopelessness 2/10.   A: Meds given as ordered. Q15 safety checks and rounding maintained. Support/encouragement offered.  R: Pt remains free from harm and continues with treatment. Will continue to monitor for needs/safety.

## 2017-04-28 NOTE — [facility] Group Notes (Signed)
[place] Group Notes:  (Nursing/MHT/Case Management/Adjunct)  Date:  04/28/2017  Time:  1430  Type of Therapy:  Nurse Education  Participation Level:  Did Not Attend  Summary of Progress/Problems: Pt was invited but did not attend.   Marya Landry 04/28/2017, 5:18 PM

## 2017-04-28 NOTE — Progress Notes (Signed)
Patient attended group and said that his day was a 7.  Patient explained that he slept most of the day and he feels great.

## 2017-04-28 NOTE — Plan of Care (Signed)
Problem: Safety: Goal: Periods of time without injury will increase Outcome: Adequate for Discharge Pt has had no injuries during admission.

## 2017-04-28 NOTE — Progress Notes (Signed)
[facility] MD Progress Note  04/28/2017 10:04 AM Gabriel Ashley  MRN:  [id number]   Subjective:  Patient reports that he is feeling much better. He is still concerned with another relapse if discharged now. He has a plan to discharge on Monday and work on his [employer]. He then wants to go to the gym. He is also starting school on Wednesday and states that he wants to be a history major and be a Advice worker in this area. He denies any SI/HI/AVH. He reports this episode as "a hiccup" in his life and he is ready to get past it.  Objective: Patient is pleasant and cooperative. He is making future plans. He appears with improved mood and affect. Will continue current medications and encourage group therapy.  Principal Problem: MDD (major depressive disorder), recurrent episode ([place]) Diagnosis:   Patient Active Problem List   Diagnosis Date Noted  . MDD (major depressive disorder), recurrent episode ([place]) [F33.9] 04/25/2017  . Acute renal failure (ARF) ([place]) [N17.9] 04/20/2017  . Hypertensive urgency [I16.0] 04/20/2017  . Acute congestive heart failure with left ventricular diastolic dysfunction ([place]) [I50.31] 12/04/2016  . MDD (major depressive disorder), recurrent severe, without psychosis ([place]) [F33.2] 02/13/2015  . Essential hypertension [I10]   . Substance abuse [F19.10] 10/26/2014  . Substance induced mood disorder ([place]) [F19.94]   . Major depressive disorder, recurrent, severe without psychotic features ([place]) [F33.2]   . Severe major depression ([place]) [F32.2] 10/23/2014  . PTSD (post-traumatic stress disorder) [F43.10]   . Essential hypertension, benign [I10] 05/11/2014  . Elevated serum creatinine [R79.89] 05/08/2014  . Depressive disorder [F32.9] 05/07/2014  . Suicidal ideation [R45.851] 05/07/2014  . Homicidal ideation [R45.850] 05/07/2014  . Anxiety [F41.9] 05/07/2014  . Severe recurrent major depression without psychotic features ([place]) [F33.2] 05/07/2014   Total Time spent  with patient: 25 minutes  Past Psychiatric History: See H&P  Past Medical History:  Past Medical History:  Diagnosis Date  . Anxiety   . Asthma   . Hypertension   . Post traumatic stress disorder (PTSD)    History reviewed. No pertinent surgical history. Family History: History reviewed. No pertinent family history. Family Psychiatric  History: See H&P Social History:  History  Alcohol Use  . Yes    Comment: 2 40 oz beers last night     History  Drug Use  . Types: Cocaine, [name], Marijuana  . Sexual activity: Not Asked   Other Topics Concern  . None   Social History Narrative  . None   Additional Social History:                         Sleep: Good  Appetite:  Good  Current Medications: Current Facility-Administered Medications  Medication Dose Route Frequency Provider Last Rate Last Dose  . acetaminophen (TYLENOL) tablet 650 mg  650 mg Oral Q6H PRN Okonkwo, Justina A, NP   650 mg at 04/27/17 2249  . albuterol (PROVENTIL) (2.5 MG/3ML)  0.083% nebulizer solution 3 mL  3 mL Inhalation Q6H PRN Okonkwo, Justina A, NP      . amLODipine (NORVASC) tablet 10 mg  10 mg Oral Daily Okonkwo, Justina A, NP   10 mg at 04/28/17 0939  . aspirin EC tablet 81 mg  81 mg Oral Daily Okonkwo, Justina A, NP   81 mg at 04/28/17 0938  . cloNIDine (CATAPRES) tablet 0.2 mg  0.2 mg Oral BID Okonkwo, Justina A, NP   0.2 mg at 04/28/17 0938  . docusate sodium (COLACE) capsule 100 mg  100 mg Oral Daily PRN Okonkwo, Justina A, NP      . escitalopram (LEXAPRO) tablet 5 mg  5 mg Oral Daily Cobos, Myer Peer, MD    5 mg at 04/28/17 0938  . folic acid (FOLVITE) tablet 1 mg  1 mg Oral Daily Okonkwo, Justina A, NP   1 mg at 04/28/17 0939  . gabapentin (NEURONTIN) capsule 100 mg  100 mg Oral BID Cobos, Myer Peer, MD   100 mg at 04/28/17 0938  . hydrALAZINE (APRESOLINE) tablet 25 mg  25 mg Oral TID Lu Duffel, Justina A, NP   25 mg at 04/28/17 0937  . hydrOXYzine (ATARAX/VISTARIL) tablet 25 mg  25 mg Oral TID PRN Hughie Closs A, NP   25 mg at 04/25/17 2209  . magnesium hydroxide (MILK OF MAGNESIA) suspension 30 mL  30 mL Oral Daily PRN Okonkwo, Justina A, NP      . multivitamin with minerals tablet 1 tablet  1 tablet Oral Daily Okonkwo, Justina A, NP   1 tablet at 04/28/17 0938  . nicotine (NICODERM CQ - dosed in mg/24 hours) patch 21 mg  21 mg Transdermal Daily Patriciaann Clan E, PA-C   21 mg at 04/28/17 0939  . polyethylene glycol (MIRALAX / GLYCOLAX) packet 17 g  17 g Oral Daily Okonkwo, Justina A, NP   17 g at 04/28/17 0939  . traZODone (DESYREL) tablet 50 mg  50 mg Oral QHS PRN Lu Duffel, Justina A, NP   50 mg at 04/28/17 2637    Lab Results:  Results for orders placed or performed during the hospital encounter of 04/25/17 (from the past 48 hour(s))  Basic metabolic panel     Status: Abnormal   Collection Time: 04/27/17  6:47 AM  Result Value Ref Range   Sodium 142 135 - 145 mmol/L   Potassium 4.6 3.5 - 5.1 mmol/L   Chloride 109 101 - 111 mmol/L   CO2 24 22 - 32 mmol/L   Glucose, Bld 84 65 - 99 mg/dL   BUN 40 (H) 6 - 20 mg/dL   Creatinine, Ser 3.71 (H) 0.61 - 1.24 mg/dL   Calcium 8.8 (L) 8.9 - 10.3 mg/dL   GFR calc non Af Amer 17 (L) >60 mL/min   GFR calc Af Amer 20 (L) >60 mL/min    Comment: (NOTE) The eGFR has been calculated using the CKD EPI equation. This calculation has not been validated in all clinical situations. eGFR's persistently <60 mL/min signify possible Chronic Kidney Disease.    Anion gap 9 5 - 15    Comment: Performed at [facility], [place] [address]  [place], [place] [address]    Blood Alcohol level:  Lab Results  Component Value Date   ETH <5 04/20/2017   ETH <5 02/77/4128    Metabolic Disorder Labs: No results found for: HGBA1C, MPG No results found for: PROLACTIN No results found for: CHOL, TRIG, HDL, CHOLHDL,  VLDL, LDLCALC  Physical Findings: AIMS: Facial and Oral Movements Muscles of Facial Expression: None, normal Lips and Perioral Area: None, normal Jaw: None, normal Tongue: None, normal,Extremity Movements Upper (arms, wrists, hands, fingers): None, normal Lower (legs, knees, ankles, toes): None, normal, Trunk Movements Neck, shoulders, hips: None, normal, Overall Severity Severity of abnormal movements (highest score from questions above): None, normal Incapacitation due to abnormal movements: None, normal Patient's awareness of abnormal movements (rate only patient's report): No Awareness, Dental Status Current problems with teeth and/or dentures?: No Does patient usually wear dentures?: No  CIWA:    COWS:     Musculoskeletal: Strength & Muscle Tone: within normal limits Gait & Station: normal Patient leans: N/A  Psychiatric Specialty Exam: Physical Exam  Nursing note and vitals reviewed. Constitutional: He is oriented to person, place, and time. He appears well-developed and well-nourished.  Cardiovascular: Normal rate.   Respiratory: Effort normal.  Musculoskeletal: Normal range of motion.  Neurological: He is alert and oriented to person, place, and time.  Skin: Skin is warm.    Review of Systems  Constitutional: Negative.   HENT: Negative.   Eyes: Negative.   Respiratory: Negative.   Cardiovascular: Negative.   Gastrointestinal: Negative.   Genitourinary: Negative.   Musculoskeletal: Negative.   Skin: Negative.   Neurological: Negative.   Endo/Heme/Allergies: Negative.     Blood pressure (!) 145/96, pulse 76, temperature 98.1 F (36.7 C), temperature source Oral, resp. rate 18, height 6' (1.829  m), weight 95.3 kg (210 lb).Body mass index is 28.48 kg/m.  General Appearance: Casual  Eye Contact:  Good  Speech:  Clear and Coherent and Normal Rate  Volume:  Normal  Mood:  Euthymic  Affect:  Appropriate  Thought Process:  Coherent and Descriptions of Associations: Intact  Orientation:  Full (Time, Place, and Person)  Thought Content:  WDL  Suicidal Thoughts:  No  Homicidal Thoughts:  No  Memory:  Immediate;   Good Recent;   Good  Judgement:  Good  Insight:  Good  Psychomotor Activity:  Normal  Concentration:  Concentration: Good  Recall:  Good  Fund of Knowledge:  Good  Language:  Good  Akathisia:  No  Handed:  Right  AIMS (if indicated):     Assets:  Communication Skills Desire for Improvement Financial Resources/Insurance Housing Social Support  ADL's:  Intact  Cognition:  WNL  Sleep:  Number of Hours: 4.5     Treatment Plan Summary: Daily contact with patient to assess and evaluate symptoms and progress in treatment, Medication management and Plan is to:  -Encourage group therapy participation -Continue Lexapro 5 mg PO Daily for mood stability -Continue Gabapentin 100 mg PO BID  -Continue Trazodone 50 mg PO QHS PRN for insomnia -Continue Hydroxyzine PO PRN for anxiety  Lewis Shock, FNP 04/28/2017, 10:04 AM

## 2017-04-29 NOTE — Progress Notes (Signed)
Patient attended group and said that his day was a 4. He slept most of the day and read a little during the day.

## 2017-04-29 NOTE — Progress Notes (Signed)
D: Patient states his day is going well.  He plans to be discharged tomorrow and return to his prior living situation.  Patient is sleeping and eating well; his energy level is normal and his concentration remains poor.  He is attending groups and participating in his treatment. He is pleased his blood pressure has improved since admission.  Patient denies any thoughts of self harm.  He is pleasant and is interacting well with staff and peers. A: Continue to monitor medication management and MD orders.  Safety checks completed every 15 minutes per protocol.  Offer support and encouragement as needed. R: Patient is responsive to staff; his behavior is appropriate.

## 2017-04-29 NOTE — [facility] Group Notes (Signed)
[place] LCSW Group Therapy Note  Date/Time:  04/29/2017 10:00-11:00AM  Type of Therapy and Topic:  Group Therapy:  Healthy and Unhealthy Supports  Participation Level:  Did Not Attend   Description of Group:  Patients in this group were introduced to the idea of adding a variety of healthy supports to address the various needs in their lives. The picture on the front of Sunday's workbook was used to demonstrate why more supports are needed in every patient's life.  Patients identified and described healthy supports versus unhealthy supports in general, then gave examples of each in their own lives.   They discussed what additional healthy supports could be helpful in their recovery and wellness after discharge in order to prevent future hospitalizations.   An emphasis was placed on using counselor, doctor, therapy groups, 12-step groups, and problem-specific support groups to expand supports.  They also worked as a group on developing a specific plan for several patients to deal with unhealthy supports through [place], psychoeducation with loved ones, and even termination of relationships.   Therapeutic Goals:   1)  discuss importance of adding supports to stay well once out of the hospital  2)  compare healthy versus unhealthy supports and identify some examples of each  3)  generate ideas and descriptions of healthy supports that can be added  4)  offer mutual support about how to address unhealthy supports  5)  encourage active participation in and adherence to discharge plan    Summary of Patient Progress:  N/A   Therapeutic Modalities:   Motivational Interviewing Brief Solution-Focused Therapy  Selmer Dominion, LCSW 04/29/2017, 12:52 PM

## 2017-04-29 NOTE — Progress Notes (Signed)
Group:  Mindfulness/Meditation Activity:  Stretching exercises and meditation activity Notes:  Patient enjoyed the session.  He appeared to be engaged during group.    Comer Locket., RN [id number]

## 2017-04-29 NOTE — Progress Notes (Signed)
[facility] MD Progress Note  04/29/2017 2:09 PM Gabriel Ashley  MRN:  [id number]    Subjective:  Patient reports feeling good today with a depression level of 3. He states that he is ready for discharge tomorrow. He denies any SI/HI/AVH. He has stated he will continue with his plan or work and getting his insurance with [facility] straightened out.  Objective: Patient is pleasant and cooperative. He has a pleasant mood and appropriate affect. He has logical thinking.  Principal Problem: MDD (major depressive disorder), recurrent episode ([place]) Diagnosis:   Patient Active Problem List   Diagnosis Date Noted  . MDD (major depressive disorder), recurrent episode ([place]) [F33.9] 04/25/2017  . Acute renal failure (ARF) ([place]) [N17.9] 04/20/2017  . Hypertensive urgency [I16.0] 04/20/2017  . Acute congestive heart failure with left ventricular diastolic dysfunction ([place]) [I50.31] 12/04/2016  . MDD (major depressive disorder), recurrent severe, without psychosis ([place]) [F33.2] 02/13/2015  . Essential hypertension [I10]   . Substance abuse [F19.10] 10/26/2014  . Substance induced mood disorder ([place]) [F19.94]   . Major depressive disorder, recurrent, severe without psychotic features ([place]) [F33.2]   . Severe major depression ([place]) [F32.2] 10/23/2014  . PTSD (post-traumatic stress disorder) [F43.10]   . Essential hypertension, benign [I10] 05/11/2014  . Elevated serum creatinine [R79.89] 05/08/2014  . Depressive disorder [F32.9] 05/07/2014  . Suicidal ideation [R45.851] 05/07/2014  . Homicidal ideation [R45.850] 05/07/2014  . Anxiety [F41.9] 05/07/2014  . Severe recurrent major depression without psychotic features ([place]) [F33.2] 05/07/2014   Total Time spent with patient: 15 minutes  Past Psychiatric History: See H&P  Past Medical History:  Past Medical History:  Diagnosis Date  . Anxiety   . Asthma   . Hypertension   . Post traumatic stress disorder (PTSD)    History reviewed. No pertinent surgical  history. Family History: History reviewed. No pertinent family history. Family Psychiatric  History: See H&P Social History:  History  Alcohol Use  . Yes    Comment: 2 40 oz beers last night     History  Drug Use  . Types: Cocaine, [name], Marijuana  . Sexual activity: Not Asked   Other Topics Concern  . None   Social History Narrative  . None   Additional Social History:     Sleep: Good  Appetite:  Good  Current Medications: Current Facility-Administered Medications  Medication Dose Route Frequency Provider Last Rate Last Dose  . acetaminophen (TYLENOL) tablet 650 mg  650 mg Oral Q6H PRN Okonkwo, Justina A, NP   650 mg at 04/28/17 2220  . albuterol (PROVENTIL) (2.5 MG/3ML) 0.083% nebulizer solution 3 mL  3 mL Inhalation Q6H PRN Okonkwo, Justina A, NP      . amLODipine (NORVASC) tablet 10 mg  10 mg Oral Daily Okonkwo, Justina A, NP   10 mg at 04/29/17 1443  . aspirin EC tablet 81 mg  81 mg Oral Daily Okonkwo, Justina A, NP   81 mg at 04/29/17 0811  . cloNIDine (CATAPRES) tablet  0.2 mg  0.2 mg Oral BID Okonkwo, Justina A, NP   0.2 mg at 04/29/17 0811  . docusate sodium (COLACE) capsule 100 mg  100 mg Oral Daily PRN Okonkwo, Justina A, NP      . escitalopram (LEXAPRO) tablet 5 mg  5 mg Oral Daily Cobos, Myer Peer, MD   5 mg at 04/29/17 0811  . folic acid (FOLVITE) tablet 1 mg  1 mg Oral Daily Okonkwo, Justina A, NP   1 mg at 04/29/17 0811  . gabapentin (NEURONTIN) capsule 100 mg  100 mg Oral BID Cobos, Myer Peer, MD   100 mg at 04/29/17 0811  . hydrALAZINE (APRESOLINE) tablet 25 mg  25 mg Oral  TID Hughie Closs A, NP   25 mg at 04/29/17 1300  . hydrOXYzine (ATARAX/VISTARIL) tablet 25 mg  25 mg Oral TID PRN Lu Duffel, Justina A, NP   25 mg at 04/29/17 1300  . magnesium hydroxide (MILK OF MAGNESIA) suspension 30 mL  30 mL Oral Daily PRN Okonkwo, Justina A, NP      . multivitamin with minerals tablet 1 tablet  1 tablet Oral Daily Okonkwo, Justina A, NP   1 tablet at 04/29/17 0811  . nicotine (NICODERM CQ - dosed in mg/24 hours) patch 21 mg  21 mg Transdermal Daily Patriciaann Clan E, PA-C   21 mg at 04/29/17 1540  . polyethylene glycol (MIRALAX / GLYCOLAX) packet 17 g  17 g Oral Daily Okonkwo, Justina A, NP   17 g at 04/29/17 0810  . traZODone (DESYREL) tablet 50 mg  50 mg Oral QHS PRN Okonkwo, Justina A, NP   50 mg at 04/28/17 2220    Lab Results: No results found for this or any previous visit (from the past 48 hour(s)).  Blood Alcohol level:  Lab Results  Component Value Date   ETH <5 04/20/2017   ETH <5 08/67/6195    Metabolic Disorder Labs: No results found for: HGBA1C, MPG No results found for: PROLACTIN No results found for: CHOL, TRIG, HDL, CHOLHDL, VLDL, LDLCALC  Physical Findings: AIMS: Facial and Oral Movements Muscles of Facial Expression: None, normal Lips and Perioral Area: None, normal Jaw: None, normal Tongue: None, normal,Extremity Movements Upper (arms, wrists, hands, fingers): None, normal Lower (legs, knees, ankles, toes): None, normal, Trunk Movements Neck, shoulders, hips: None, normal, Overall Severity Severity of abnormal movements (highest score from questions above): None, normal Incapacitation due to abnormal movements: None, normal Patient's awareness of abnormal movements (rate only patient's report): No Awareness, Dental Status Current problems with teeth and/or dentures?: No Does patient usually wear dentures?: No  CIWA:    COWS:     Musculoskeletal: Strength & Muscle Tone: within normal limits Gait & Station: normal Patient leans:  N/A  Psychiatric Specialty Exam: Physical Exam  Nursing note and vitals reviewed. Constitutional: He is oriented to person, place, and time. He appears well-developed.  Cardiovascular: Normal rate.   Respiratory: Effort normal.  Neurological: He is alert and oriented to person, place, and time.    Review of Systems  Constitutional: Negative.   HENT: Negative.   Eyes: Negative.   Respiratory: Negative.   Cardiovascular: Negative.   Gastrointestinal: Negative.   Genitourinary: Negative.   Musculoskeletal: Negative.   Skin: Negative.   Neurological: Negative.   Endo/Heme/Allergies: Negative.     Blood pressure 116/72, pulse 75, temperature 98.2 F (36.8 C), resp. rate 20, height 6' (1.829 m), weight 95.3 kg (210 lb), SpO2 100 %.Body mass index is 28.48 kg/m.  General Appearance: Casual  Eye Contact:  Good  Speech:  Clear and Coherent and Normal Rate  Volume:  Normal  Mood:  Euthymic  Affect:  Appropriate  Thought Process:  Coherent and Descriptions of Associations: Intact  Orientation:  Full (Time, Place, and Person)  Thought Content:  WDL  Suicidal Thoughts:  No  Homicidal Thoughts:  No  Memory:  Immediate;   Good Recent;   Good  Judgement:  Good  Insight:  Good  Psychomotor Activity:  Normal  Concentration:  Concentration: Good  Recall:  Good  Fund of Knowledge:  Good  Language:  Good  Akathisia:  No  Handed:  Right  AIMS (if indicated):     Assets:  Financial Resources/Insurance Social Support  ADL's:  Intact  Cognition:  WNL  Sleep:  Number of Hours: 4.5     Treatment Plan Summary: Daily contact with patient to assess and evaluate symptoms and progress in treatment, Medication management and Plan is to:  -Continue Lexapro 5 mg PO Daily for mood stability -Continue gabapentin 100 mg PO BID for mood stability -Continue Hydroxyzine 25 mg PO TID PRN for anxiety -Continue Trazodone 50 mg PO QHS PRN for insomnia -Encourage group therapy  participation -Possible discharge from unit 04/29/17  Lewis Shock, FNP 04/29/2017, 2:09 PM

## 2017-04-30 DIAGNOSIS — F1424 Cocaine dependence with cocaine-induced mood disorder: Secondary | ICD-10-CM

## 2017-04-30 MED ORDER — AMLODIPINE BESYLATE 10 MG PO TABS: 10.0000 mg | ORAL_TABLET | Freq: Every day | ORAL | 0 refills | Status: DC

## 2017-04-30 MED ORDER — DOCUSATE SODIUM 100 MG PO CAPS: 100.0000 mg | ORAL_CAPSULE | Freq: Every day | ORAL | 0 refills | Status: DC | PRN

## 2017-04-30 MED ORDER — HYDROXYZINE HCL 25 MG PO TABS: 25.0000 mg | ORAL_TABLET | Freq: Three times a day (TID) | ORAL | 0 refills | Status: DC | PRN

## 2017-04-30 MED ORDER — ONE-DAILY MULTI VITAMINS PO TABS: 1.0000 | ORAL_TABLET | Freq: Every day | ORAL | Status: AC

## 2017-04-30 MED ORDER — NICOTINE 21 MG/24HR TD PT24: 21.0000 mg | MEDICATED_PATCH | Freq: Every day | TRANSDERMAL | 0 refills | Status: DC

## 2017-04-30 MED ORDER — TRAZODONE HCL 50 MG PO TABS: 50.0000 mg | ORAL_TABLET | Freq: Every evening | ORAL | 0 refills | Status: DC | PRN

## 2017-04-30 MED ORDER — GABAPENTIN 100 MG PO CAPS: 100.0000 mg | ORAL_CAPSULE | Freq: Two times a day (BID) | ORAL | 0 refills | Status: DC

## 2017-04-30 MED ORDER — POLYETHYLENE GLYCOL 3350 17 G PO PACK: 17.0000 g | PACK | Freq: Every day | ORAL | 0 refills | Status: DC

## 2017-04-30 MED ORDER — HYDRALAZINE HCL 25 MG PO TABS: 25.0000 mg | ORAL_TABLET | Freq: Three times a day (TID) | ORAL | 0 refills | Status: DC

## 2017-04-30 MED ORDER — CLONIDINE HCL 0.2 MG PO TABS: 0.2000 mg | ORAL_TABLET | Freq: Two times a day (BID) | ORAL | 0 refills | Status: DC

## 2017-04-30 MED ORDER — ASPIRIN 81 MG PO TBEC: 81.0000 mg | DELAYED_RELEASE_TABLET | Freq: Every day | ORAL | Status: DC

## 2017-04-30 MED ORDER — FOLIC ACID 1 MG PO TABS: 1.0000 mg | ORAL_TABLET | Freq: Every day | ORAL | 0 refills | Status: DC

## 2017-04-30 MED ORDER — ESCITALOPRAM OXALATE 5 MG PO TABS: 5.0000 mg | ORAL_TABLET | Freq: Every day | ORAL | 0 refills | Status: DC

## 2017-04-30 NOTE — Progress Notes (Signed)
Discharge Note:  Patient discharged home.  Patient denied SI and HI.  Denied A/V hallucinations.  Suicide prevention information given and discussed with patient who stated he understood and had no questions.  Patient stated he received all his belongings, clothing, toiletries, misc items, prescription, medications, etc.  Patient stated he appreciated all assistance received from [facility] staff.  All required discharge information given to patient at discharge.

## 2017-04-30 NOTE — Progress Notes (Signed)
Gabriel Ashley had been up and visible in milieu this evening, did attend and participate in evening group activity. Gabriel Ashley had limited interactions with peers in milieu. Gabriel Ashley spoke about how he is feeling ok and spoke about how he is suppose to be discharged in the morning. Gabriel Ashley was able to receive bedtime medications and denied any SI this evening. A. Support and encouragement provided. R. Safety maintained, will continue to monitor.
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
## 2017-04-30 NOTE — Progress Notes (Signed)
Adult Psychoeducational Group Note  Date:  04/30/2017 Time:  10:58 AM  Group Topic/Focus:  Wellness Toolbox:   The focus of this group is to discuss various aspects of wellness, balancing those aspects and exploring ways to increase the ability to experience wellness.  Patients will create a wellness toolbox for use upon discharge.  Participation Level:  Active  Participation Quality:  Attentive  Affect:  Appropriate  Cognitive:  Alert  Insight: Appropriate, Good and Improving  Engagement in Group:  Engaged  Modes of Intervention:  Discussion  Additional Comments:  Pt did participate in all group activities and discussions today.  Elroy Schembri R Briea Mcenery 04/30/2017, 10:58 AM

## 2017-04-30 NOTE — Progress Notes (Signed)
  [facility] Adult Case Management Discharge Plan :  Will you be returning to the same living situation after discharge:  Yes,  Pt returning to his apartment At discharge, do you have transportation home?: Yes,  Pt provided with bus pass Do you have the ability to pay for your medications: Yes,  Pt provided with samples and prescriptions  Release of information consent forms completed and in the chart;  Patient's signature needed at discharge.  Patient to Follow up at: Follow-up Information    Pt declines for CSW to make appt with outpatient [facility] provider Follow up.           Next level of care provider has access to [place] and Suicide Prevention discussed: Yes,  with Pt; declines family contact  Have you used any form of tobacco in the last 30 days? (Cigarettes, Smokeless Tobacco, Cigars, and/or Pipes): Yes  Has patient been referred to the Quitline?: Patient refused referral  Patient has been referred for addiction treatment: Pt. refused referral  [name], LCSW 04/30/2017, 9:36 AM

## 2017-04-30 NOTE — Progress Notes (Signed)
D:  Patient denied SI and HI, contracts for safety.  Denied A/V hallucinations. A:  Medications administered per MD orders.  Emotional support and encouragement given patient. R:  Safety maintained with 15 minute checks.  Patient stated he felt tired and sleepy.

## 2017-04-30 NOTE — [facility] Suicide Risk Assessment (Addendum)
[facility] Discharge Suicide Risk Assessment   Principal Problem: MDD (major depressive disorder), recurrent episode [facility]) Discharge Diagnoses:  Patient Active Problem List   Diagnosis Date Noted  . MDD (major depressive disorder), recurrent episode ([place]) [F33.9] 04/25/2017  . Acute renal failure (ARF) ([place]) [N17.9] 04/20/2017  . Hypertensive urgency [I16.0] 04/20/2017  . Acute congestive heart failure with left ventricular diastolic dysfunction ([place]) [I50.31] 12/04/2016  . MDD (major depressive disorder), recurrent severe, without psychosis ([place]) [F33.2] 02/13/2015  . Essential hypertension [I10]   . Substance abuse [F19.10] 10/26/2014  . Substance induced mood disorder ([place]) [F19.94]   . Major depressive disorder, recurrent, severe without psychotic features ([place]) [F33.2]   . Severe major depression ([place]) [F32.2] 10/23/2014  . PTSD (post-traumatic stress disorder) [F43.10]   . Essential hypertension, benign [I10] 05/11/2014  . Elevated serum creatinine [R79.89] 05/08/2014  . Depressive disorder [F32.9] 05/07/2014  . Suicidal ideation [R45.851] 05/07/2014  . Homicidal ideation [R45.850] 05/07/2014  . Anxiety [F41.9] 05/07/2014  . Severe recurrent major depression without psychotic features ([place]) [F33.2] 05/07/2014    Total Time spent with patient: 30 minutes  Musculoskeletal: Strength & Muscle Tone: within normal limits Gait & Station: normal Patient leans: N/A  Psychiatric Specialty Exam: ROS  Blood pressure (!) 138/92, pulse 64, temperature 98.5 F (36.9 C), temperature source Oral, resp. rate 20, height 6' (1.829 m), weight 95.3 kg (210 lb), SpO2 100 %.Body mass index is 28.48 kg/m.  General Appearance: Well Groomed  Eye Contact::  Good  Speech:  Normal Rate409  Volume:  Normal  Mood:  improved mood , presents euthymic today   Affect:  Appropriate and reactive   Thought Process:  Linear and Descriptions of Associations: Intact  Orientation:  Other:  fully alert and  attentive   Thought Content:  denies hallucinations, no delusions, not internally preoccupied   Suicidal Thoughts:  No denies suicidal or self injurious ideations, denies any homicidal or violent ideations   Homicidal Thoughts:  No  Memory:  recent and remote grossly intact   Judgement:  Other:  improving   Insight:  improving   Psychomotor Activity:  Normal  Concentration:  Good  Recall:  Good  Fund of Knowledge:Good  Language: Good  Akathisia:  Negative  Handed:  Right  AIMS (if indicated):     Assets:  Communication Skills Desire for Improvement Resilience  Sleep:  Number of Hours: 6  Cognition: WNL  ADL's:  Intact   Mental Status Per Nursing Assessment::   On Admission:  Suicidal ideation indicated by patient, Self-harm thoughts  Demographic Factors:  Divorced, has one adult son, lives alone, on Veteran's disability, no legal issues   Loss Factors: Recent relapse   Historical Factors: History of depression, history of PTSD, history of Cocaine Use Disorder   Risk Reduction Factors:   Sense of responsibility to family and Positive coping skills or problem solving skills  Continued Clinical Symptoms:  At this time patient is alert, attentive, well related, calm, mood is improved, affect is appropriate, reactive, no thought disorder, no suicidal or homicidal ideations, no hallucinations, no delusions,future oriented. Visible on unit, going to groups, calm, pleasant on approach Denies medication side effects- tolerating Lexapro trial well thus far- side effects discussed  As noted, Creatinine has been trending down, towards improvement   Cognitive Features That Contribute To Risk:  No gross cognitive deficits noted upon discharge. Is alert , attentive, and oriented x 3   Suicide Risk:  Mild:  Suicidal ideation of limited frequency, intensity, duration,  and specificity.  There are no identifiable plans, no associated intent, mild dysphoria and related symptoms, good  self-control (both objective and subjective assessment), few other risk factors, and identifiable protective factors, including available and accessible social support.  Follow-up Information    Pt declines for CSW to make appt with outpatient [facility] provider Follow up.           Plan Of Care/Follow-up recommendations:  Activity:  as tolerated  Diet:  heart healthy Tests:  NA Other:  See below   Patient is requesting discharge, leaving unit in good spirits  Plans to return home. Patient is planning on following with his [place] psychiatrist and [place] PCP .  * We reviewed importance of actively following up with his outpatient providers and with PCP for ongoing monitoring and treatment of kidney disease. We reviewed importance of maintaining focus on sobriety , relapse prevention, and encouraged patient to avoid people, places and situations he associates with drug abuse in order to decrease risk of relapse .   Jenne Campus, MD 04/30/2017, 1:25 PM

## 2017-08-29 IMAGING — DX DG CHEST 2V
2 series · 2 of 2 positions shown · non-contrast
Comparison: October 22, 2014

CLINICAL DATA: Wheezing for 3 weeks

EXAM:
CHEST  2 VIEW

[w chest pa]
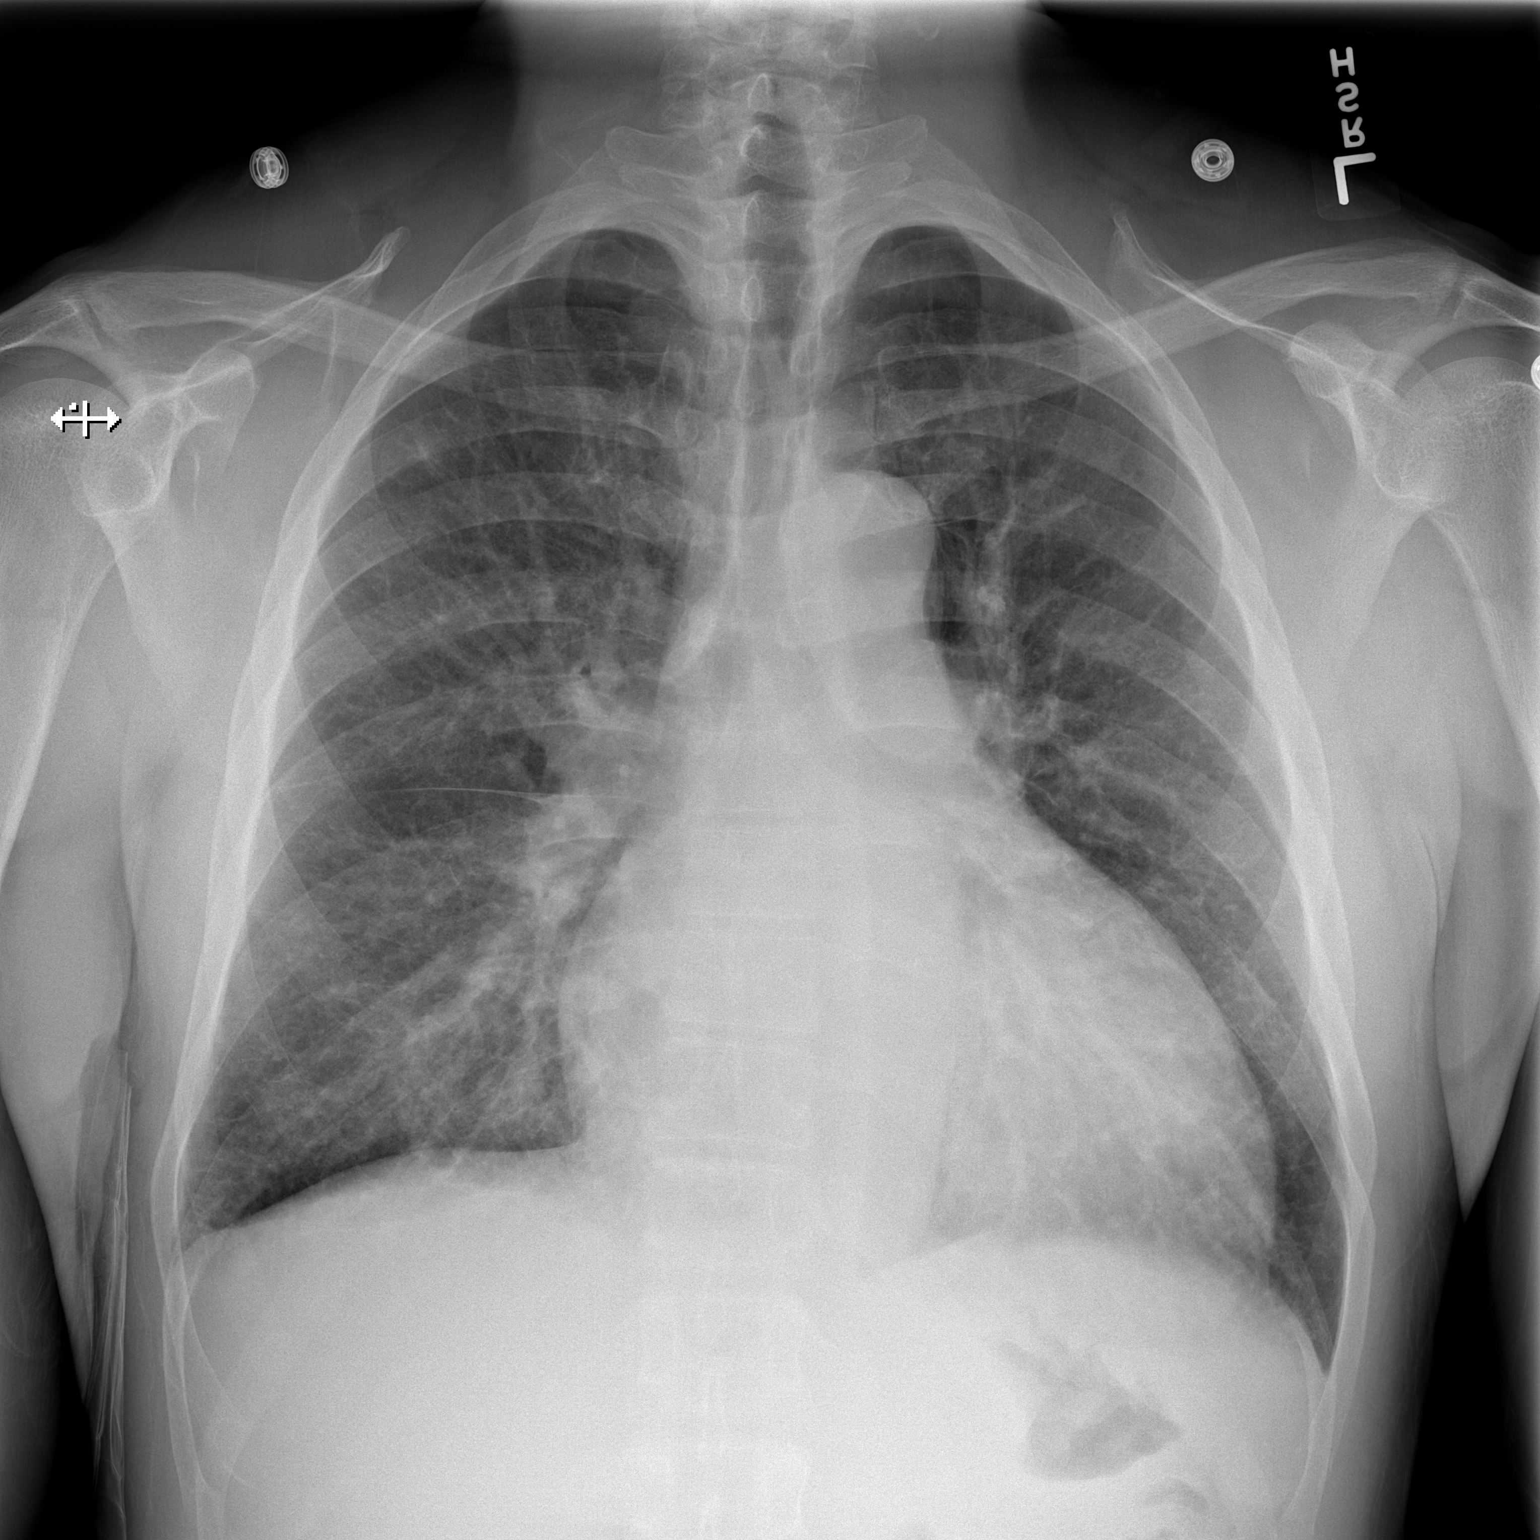

[w chest lat]
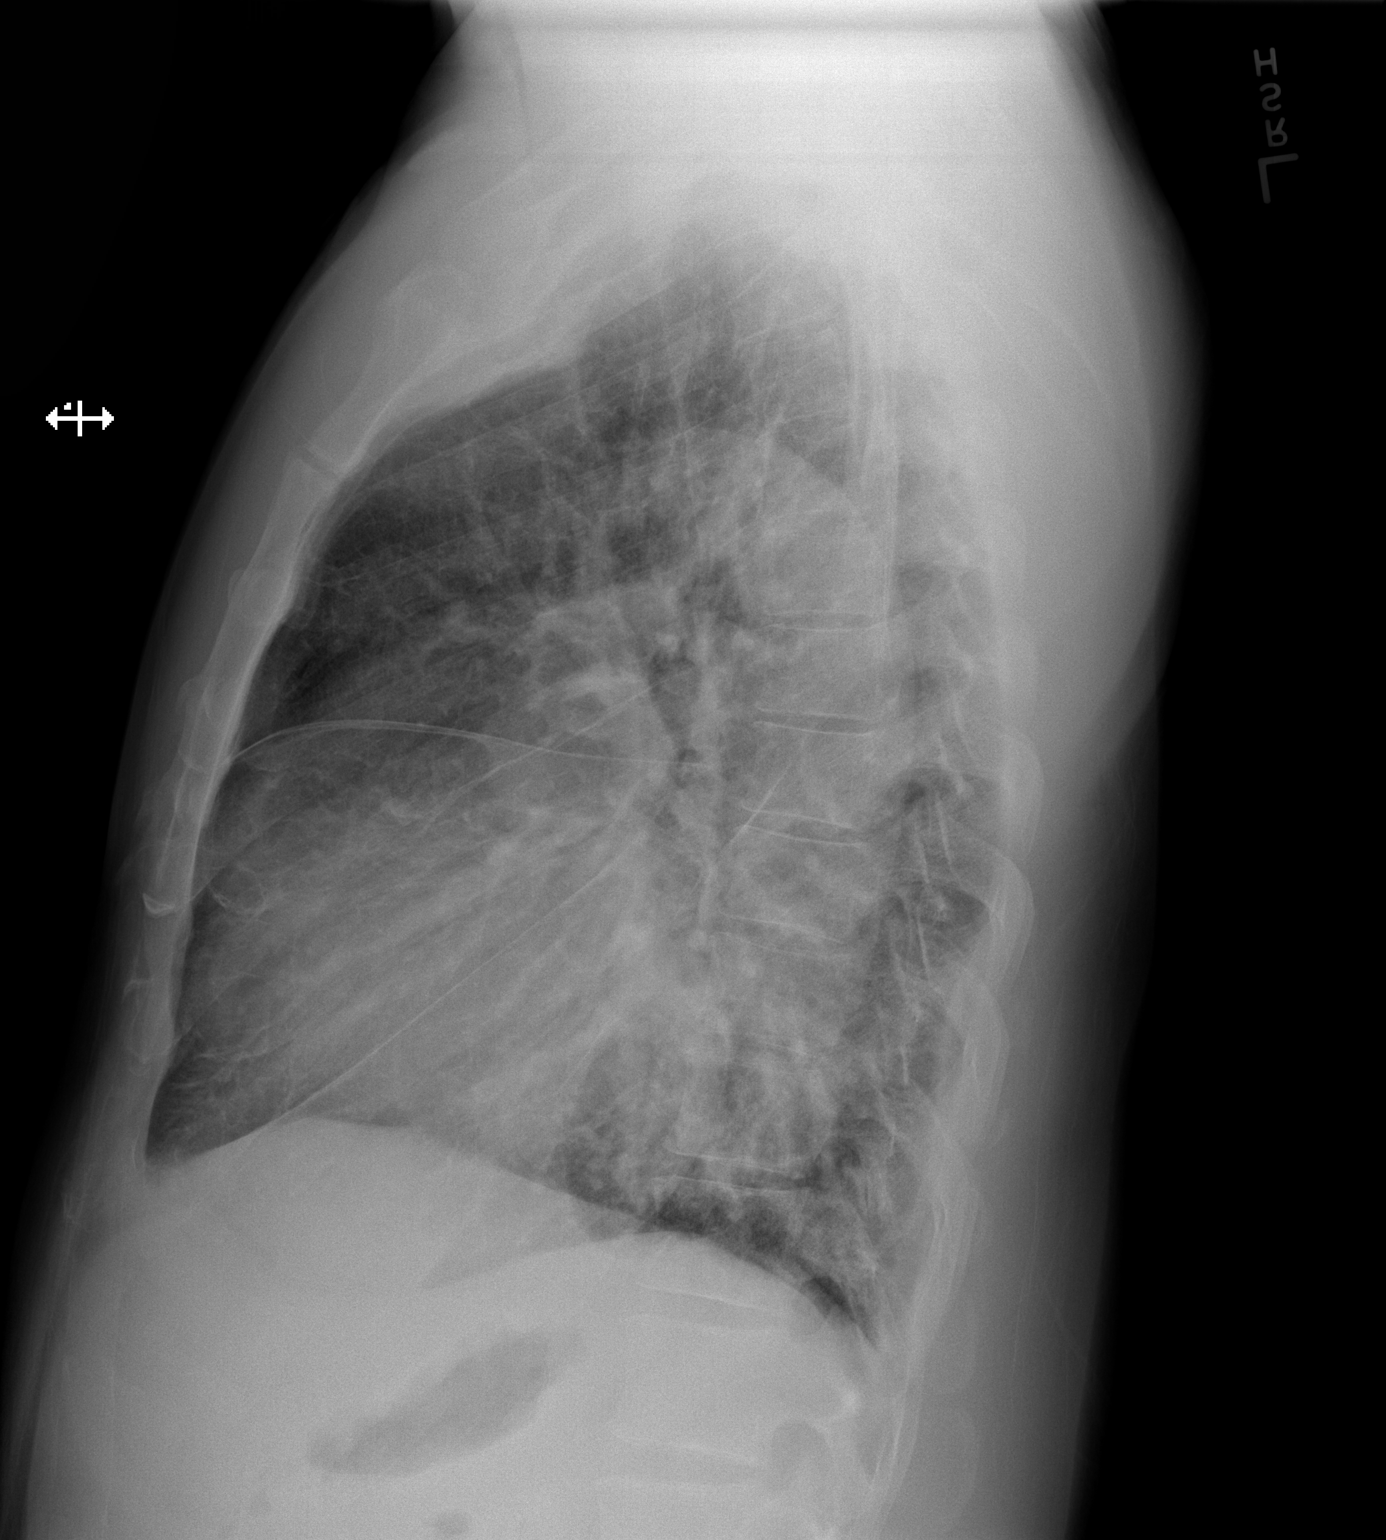

[2 of 2 positions shown; findings below may reference images not displayed]

FINDINGS: There is a slightly irregular opacity in or overlying the right
upper lobe which likely represents a monitor lead. Elsewhere, there
is slight interstitial pulmonary edema. There is cardiomegaly with
pulmonary venous hypertension. No adenopathy. No bone lesions.
IMPRESSION: Evidence a degree of congestive heart failure. Irregular opacity
right upper lobe may represent overlying monitor leads; repeat study
with removal of apparent monitor lead to exclude a small mass lesion
in the right upper lobe advised. No evident adenopathy.

## 2017-08-31 IMAGING — NM NM MYOCAR MULTI W/SPECT W/WALL MOTION & EF
1 series · 6 of 6 positions shown · non-contrast
Comparison: None.

CLINICAL DATA: Shortness of breath.  Hypertension.  Smoker.

EXAM:
MYOCARDIAL IMAGING WITH SPECT (REST AND PHARMACOLOGIC-STRESS)
GATED LEFT VENTRICULAR WALL MOTION STUDY
LEFT VENTRICULAR EJECTION FRACTION
TECHNIQUE: Standard myocardial SPECT imaging was performed after resting
intravenous injection of 10 mCi 3c-DDm tetrofosmin. Subsequently,
intravenous infusion of Lexiscan was performed under the supervision
of the Cardiology staff. At peak effect of the drug, 30 mCi 3c-DDm
tetrofosmin was injected intravenously and standard myocardial SPECT
imaging was performed. Quantitative gated imaging was also performed
to evaluate left ventricular wall motion, and estimate left
ventricular ejection fraction.

[Series 2: rest · 8.28mm/px · 6 of 64 frames shown]
[frame 6/64]
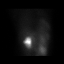
[frame 16/64]
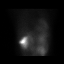
[frame 27/64]
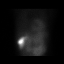
[frame 38/64]
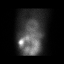
[frame 48/64]
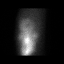
[frame 59/64]
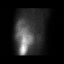

[6 of 6 positions shown; findings below may reference images not displayed]

FINDINGS: Perfusion: A large fixed area of decreased myocardial activity is
seen in the inferior and inferoapical walls of the left ventricle on
both stress and rest imaging, consistent with myocardial infarct. No
reversible myocardial perfusion defects are seen to suggest the
presence of inducible ischemia.

Wall Motion: Moderate left ventricular dilatation. Inferior and
inferoapical wall hypokinesis.

Left Ventricular Ejection Fraction: 28 %

End diastolic volume 290 ml

End systolic volume 210 ml
IMPRESSION: 1. No reversible ischemia. Inferior and inferoapical wall myocardial
infarct.

2. Inferior and inferoapical wall hypokinesis, with moderate left
ventricular dilatation.

3. Left ventricular ejection fraction 29%

4. Non invasive risk stratification*: High

*3593 Appropriate Use Criteria for Coronary Revascularization
Focused Update: J Am Coll Cardiol. 3593;59(9):857-881.
[URL]

## 2018-02-05 ENCOUNTER — Encounter: Payer: Self-pay | Admitting: Internal Medicine

## 2018-04-02 ENCOUNTER — Ambulatory Visit (INDEPENDENT_AMBULATORY_CARE_PROVIDER_SITE_OTHER): Payer: No Typology Code available for payment source | Admitting: Internal Medicine

## 2018-04-02 ENCOUNTER — Encounter (INDEPENDENT_AMBULATORY_CARE_PROVIDER_SITE_OTHER): Payer: Self-pay

## 2018-04-02 ENCOUNTER — Encounter: Payer: Self-pay | Admitting: Internal Medicine

## 2018-04-02 VITALS — BP 134/80 | HR 60 | Ht 70.25 in | Wt 199.2 lb

## 2018-04-02 DIAGNOSIS — K5900 Constipation, unspecified: Secondary | ICD-10-CM | POA: Diagnosis not present

## 2018-04-02 DIAGNOSIS — R3911 Hesitancy of micturition: Secondary | ICD-10-CM

## 2018-04-02 DIAGNOSIS — Z1211 Encounter for screening for malignant neoplasm of colon: Secondary | ICD-10-CM

## 2018-04-02 NOTE — Patient Instructions (Signed)

## 2018-04-02 NOTE — Progress Notes (Signed)
HISTORY OF PRESENT ILLNESS:  Gabriel Ashley is a 52 y.o. male , Gabriel Ashley, who is referred by the [facility] Dr. Clerance Lav for evaluation of constipation and the need for colonoscopy. Review of outside records shows that the patient underwent attempted colonoscopy September 2014 at [facility]. Examination was incomplete secondary to poor prep. Patient tells me that he did not follow the prep instructions. He has not had an evaluation since but is interested in repeat colonoscopy. He is concerned that his bowel habits have changed from once daily to once weekly. No abdominal pain or bleeding. No family history of colon cancer. GI review of systems is otherwise negative.  REVIEW OF SYSTEMS:  All non-GI ROS negative except for urinary hesitancy  Past Medical History:  Diagnosis Date  . Anemia   . Anxiety   . Asthma   . Cannabis use in remisison   . Cocaine use   . ED (erectile dysfunction)   . Hypertension   . Kidney disease   . OSA (obstructive sleep apnea)   . Post traumatic stress disorder (PTSD)   . Vitamin D deficiency     History reviewed. No pertinent surgical history.  Social History Gabriel Ashley  reports that he quit smoking about 4 months ago. He has a 31.00 pack-year smoking history. He has never used smokeless tobacco. He reports that he drank alcohol. He reports that he has current or past drug history. Drugs: Cocaine and Marijuana.  family history includes Breast cancer in his mother; Cancer in his father.  No Known Allergies     PHYSICAL EXAMINATION: Vital signs: BP 134/80 (BP Location: Left Arm, Patient Position: Sitting, Cuff Size: Normal)   Pulse 60   Ht 5' 10.25" (1.784 m) Comment: height measured without shoes  Wt 199 lb 4 oz (90.4 kg)   BMI 28.39 kg/m   Constitutional: generally well-appearing, no acute distress Psychiatric: alert and oriented x3, cooperative Eyes: extraocular movements intact, anicteric, conjunctiva pink Mouth: oral pharynx  moist, no lesions Neck: supple no lymphadenopathy Cardiovascular: heart regular rate and rhythm, no murmur Lungs: clear to auscultation bilaterally Abdomen: soft, nontender, nondistended, no obvious ascites, no peritoneal signs, normal bowel sounds, no organomegaly Rectal:deferred until colonoscopy Extremities: no clubbing, cyanosis, or lower extremity edema bilaterally Skin: no lesions on visible extremities Neuro: No focal deficits. Cranial nerves intact  ASSESSMENT:  #1. Constipation #2. Screening colonoscopy. Appropriate candidate without contraindication #3.Urinary hesitancy   PLAN:  #1. Recommend MiraLAX one to 2 doses daily. Adjust to obtain desired effect #2. Schedule screening colonoscopy.The nature of the procedure, as well as the risks, benefits, and alternatives were carefully and thoroughly reviewed with the patient. Ample time for discussion and questions allowed. The patient understood, was satisfied, and agreed to proceed. #3. Advised to talk to PCP about urology referral  A copy of this consultation note has been sent to Dr. Clerance Lav

## 2018-04-15 NOTE — Progress Notes (Signed)
Office note mailed to Dr. Clerance Lav at the [facility]

## 2018-05-27 ENCOUNTER — Telehealth: Payer: Self-pay | Admitting: Internal Medicine

## 2018-05-27 NOTE — Telephone Encounter (Signed)
Noted  

## 2018-05-28 ENCOUNTER — Telehealth: Payer: Self-pay | Admitting: *Deleted

## 2018-05-28 ENCOUNTER — Encounter: Payer: Self-pay | Admitting: Internal Medicine

## 2018-05-28 NOTE — Telephone Encounter (Signed)
Dr Henrene Pastor,  This pt was seen by you 04-02-18 and has been scheduled for a colonoscopy.  His last colon was attempted 06-17-13 and states poor prep- In your office note you said he told you he did not follow prep instructions. I wanted to ask if you thought he needed a 2 day prep   Thanks for your time, please advise,,, thanks Lelan Pons

## 2018-05-28 NOTE — Telephone Encounter (Signed)
Follow prep instructions carefully should be adequate. Thanks

## 2018-05-28 NOTE — Telephone Encounter (Signed)
Thank you  Dr Henrene PastorBilley Gosling make sure pt understands instructions

## 2018-06-20 ENCOUNTER — Encounter: Payer: Self-pay | Admitting: Internal Medicine

## 2018-07-23 ENCOUNTER — Encounter (HOSPITAL_COMMUNITY): Payer: Self-pay | Admitting: Emergency Medicine

## 2018-07-23 ENCOUNTER — Emergency Department (HOSPITAL_COMMUNITY)
Admission: EM | Admit: 2018-07-23 | Discharge: 2018-07-25 | Disposition: A | Discharge status: Other Acute Inpatient Hospital | Payer: No Typology Code available for payment source | Attending: Emergency Medicine | Admitting: Emergency Medicine

## 2018-07-23 DIAGNOSIS — R45851 Suicidal ideations: Secondary | ICD-10-CM | POA: Diagnosis not present

## 2018-07-23 DIAGNOSIS — F329 Major depressive disorder, single episode, unspecified: Secondary | ICD-10-CM | POA: Diagnosis present

## 2018-07-23 DIAGNOSIS — N189 Chronic kidney disease, unspecified: Secondary | ICD-10-CM | POA: Insufficient documentation

## 2018-07-23 DIAGNOSIS — J45909 Unspecified asthma, uncomplicated: Secondary | ICD-10-CM | POA: Insufficient documentation

## 2018-07-23 DIAGNOSIS — Z87891 Personal history of nicotine dependence: Secondary | ICD-10-CM | POA: Diagnosis not present

## 2018-07-23 DIAGNOSIS — I130 Hypertensive heart and chronic kidney disease with heart failure and stage 1 through stage 4 chronic kidney disease, or unspecified chronic kidney disease: Secondary | ICD-10-CM | POA: Insufficient documentation

## 2018-07-23 DIAGNOSIS — F332 Major depressive disorder, recurrent severe without psychotic features: Secondary | ICD-10-CM | POA: Diagnosis not present

## 2018-07-23 DIAGNOSIS — Z79899 Other long term (current) drug therapy: Secondary | ICD-10-CM | POA: Insufficient documentation

## 2018-07-23 DIAGNOSIS — I5030 Unspecified diastolic (congestive) heart failure: Secondary | ICD-10-CM | POA: Diagnosis not present

## 2018-07-23 NOTE — ED Triage Notes (Signed)
Patient reports feeling depressed with suicidal ideation but did not disclose his plan of suicide , denies hallucinations . Maroon paper scrubs given to pt at triage / security notified to wand pt.

## 2018-07-24 ENCOUNTER — Encounter (HOSPITAL_COMMUNITY): Payer: Self-pay | Admitting: Registered Nurse

## 2018-07-24 LAB — COMPREHENSIVE METABOLIC PANEL
ALBUMIN: 3.7 g/dL (ref 3.5–5.0)
ALK PHOS: 90 U/L (ref 38–126)
ALT: 12 U/L (ref 0–44)
ANION GAP: 8 (ref 5–15)
AST: 21 U/L (ref 15–41)
BILIRUBIN TOTAL: 0.4 mg/dL (ref 0.3–1.2)
BUN: 23 mg/dL — AB (ref 6–20)
CALCIUM: 8.3 mg/dL — AB (ref 8.9–10.3)
CO2: 22 mmol/L (ref 22–32)
Chloride: 110 mmol/L (ref 98–111)
Creatinine, Ser: 3.8 mg/dL — ABNORMAL HIGH (ref 0.61–1.24)
GFR calc Af Amer: 19 mL/min — ABNORMAL LOW (ref >60)
GFR calc non Af Amer: 17 mL/min — ABNORMAL LOW (ref >60)
GLUCOSE: 117 mg/dL — AB (ref 70–99)
Potassium: 3.7 mmol/L (ref 3.5–5.1)
Sodium: 140 mmol/L (ref 135–145)
TOTAL PROTEIN: 7.3 g/dL (ref 6.5–8.1)

## 2018-07-24 LAB — CBC
HCT: 37 % — ABNORMAL LOW (ref 39.0–52.0)
HEMOGLOBIN: 11.3 g/dL — AB (ref 13.0–17.0)
MCH: 26.7 pg (ref 26.0–34.0)
MCHC: 30.5 g/dL (ref 30.0–36.0)
MCV: 87.3 fL (ref 80.0–100.0)
NRBC: 0 % (ref 0.0–0.2)
PLATELETS: 265 10*3/uL (ref 150–400)
RBC: 4.24 MIL/uL (ref 4.22–5.81)
RDW: 15.2 % (ref 11.5–15.5)
WBC: 6.5 10*3/uL (ref 4.0–10.5)

## 2018-07-24 LAB — RAPID URINE DRUG SCREEN, HOSP PERFORMED
AMPHETAMINES: NOT DETECTED
BENZODIAZEPINES: NOT DETECTED
Barbiturates: NOT DETECTED
Cocaine: POSITIVE — AB
OPIATES: NOT DETECTED
TETRAHYDROCANNABINOL: POSITIVE — AB

## 2018-07-24 LAB — SALICYLATE LEVEL

## 2018-07-24 LAB — ETHANOL: Alcohol, Ethyl (B): <10 mg/dL (ref <10)

## 2018-07-24 LAB — ACETAMINOPHEN LEVEL

## 2018-07-24 MED ORDER — AMLODIPINE BESYLATE 5 MG PO TABS
10.0000 mg | ORAL_TABLET | Freq: Every day | ORAL | Status: DC
  Administered 2018-07-24 – 2018-07-25 (×2): 10 mg via ORAL
  Filled 2018-07-24 (×2): qty 2

## 2018-07-24 MED ORDER — HYDROXYZINE PAMOATE 25 MG PO CAPS
25.0000 mg | ORAL_CAPSULE | Freq: Three times a day (TID) | ORAL | Status: DC | PRN
  Filled 2018-07-24: qty 1

## 2018-07-24 MED ORDER — FUROSEMIDE 20 MG PO TABS
40.0000 mg | ORAL_TABLET | Freq: Every day | ORAL | Status: DC
  Administered 2018-07-24 – 2018-07-25 (×2): 40 mg via ORAL
  Filled 2018-07-24 (×2): qty 2

## 2018-07-24 MED ORDER — BUSPIRONE HCL 10 MG PO TABS
5.0000 mg | ORAL_TABLET | Freq: Two times a day (BID) | ORAL | Status: DC
  Administered 2018-07-24 – 2018-07-25 (×2): 5 mg via ORAL
  Filled 2018-07-24 (×2): qty 1

## 2018-07-24 MED ORDER — ALBUTEROL SULFATE HFA 108 (90 BASE) MCG/ACT IN AERS: 2.0000 | INHALATION_SPRAY | Freq: Four times a day (QID) | RESPIRATORY_TRACT | Status: DC | PRN

## 2018-07-24 MED ORDER — TRAZODONE HCL 100 MG PO TABS
100.0000 mg | ORAL_TABLET | Freq: Every evening | ORAL | Status: DC | PRN
  Administered 2018-07-24: 100 mg via ORAL
  Filled 2018-07-24: qty 1

## 2018-07-24 MED ORDER — CLONIDINE HCL 0.2 MG PO TABS
0.2000 mg | ORAL_TABLET | Freq: Two times a day (BID) | ORAL | Status: DC
  Administered 2018-07-24 – 2018-07-25 (×2): 0.2 mg via ORAL
  Filled 2018-07-24 (×2): qty 1

## 2018-07-24 MED ORDER — FLUOXETINE HCL 20 MG PO CAPS
20.0000 mg | ORAL_CAPSULE | Freq: Every day | ORAL | Status: DC
  Administered 2018-07-24 – 2018-07-25 (×2): 20 mg via ORAL
  Filled 2018-07-24 (×2): qty 1

## 2018-07-24 MED ORDER — CARVEDILOL 12.5 MG PO TABS
12.5000 mg | ORAL_TABLET | Freq: Every day | ORAL | Status: DC
  Administered 2018-07-24 – 2018-07-25 (×2): 12.5 mg via ORAL
  Filled 2018-07-24 (×2): qty 1

## 2018-07-24 MED ORDER — ADULT MULTIVITAMIN W/MINERALS CH
1.0000 | ORAL_TABLET | Freq: Every day | ORAL | Status: DC
  Administered 2018-07-24 – 2018-07-25 (×2): 1 via ORAL
  Filled 2018-07-24 (×2): qty 1

## 2018-07-24 NOTE — ED Notes (Signed)
ED Provider at bedside. 

## 2018-07-24 NOTE — ED Notes (Addendum)
Patient's clothes bagged/labelled and stored at Locker#6 @[facility] .

## 2018-07-24 NOTE — ED Notes (Signed)
Patient's belongings inventoried and placed in locker # 6-Monique,RN

## 2018-07-24 NOTE — Consult Note (Signed)
  Tele Assessment   Gabriel Ashley, 52 y.o., male patient presented to [facility] with complaints of suicidal ideation; reporting he has a plan but unwilling to elaborate or share what his plan is.  Patient seen via telepsych by this provider; chart reviewed and consulted with Dr. Dwyane Dee on 07/24/18.  Patient last recorded psychiatric [facility] 04/[facility]/18 related to suicidal ideation related to relapse on cocaine.  On evaluation Gabriel Ashley reports he is having suicidal thoughts and "I'm thinking of a plan right now."  Patient states that his worsening depression and suicidal ideation is related to his drug use.  Reports his last psychiatric hospitalization was 8 months ago and that he stayed clean for 3-4 months prior to relapsing back on cocaine.  Patient reports that he uses cocaine daily but unsure how much.  States last use was yesterday.  Patient continues to endorse suicidal ideation but will not elaborate on plan.  Patient states that he has outpatient services through [place] and that he wants to be transferred to a [place] hospital but not the one in [place], [place].  Patient denies homicidal ideation psychosis, and paranoia.  Patient states that he lives a lone, and has no family or support.  Patient states that he is on disability which is how he supports himself.  During evaluation Marcio Hoque is alert/oriented x 4; calm/cooperative; but getting agitated with questioning; repeatedly saying "I done answered these same questions before; Why are you asking the same question."  Patients mood congruent with affect.  He does not appear to be responding to internal/external stimuli or delusional thoughts.  Patient denied homicidal ideation, psychosis, and paranoia.  But continues to endorse suicidal ideation not elaboration on plan.  Patient unable to contract for safety.    Recommendations:  Inpatient psychiatric treatment.  Restart Prozac 20 mg daily; Trazodone 100 mg Q hs prn; Vistaril 25 mg tid prn; Buspar 5  mg Bid  Disposition: Recommend psychiatric Inpatient admission when medically cleared.  Spoke with Dr. Laverta Baltimore unavailable at this time.  Spoke with Gabriel Ashley, [place]; informed that notes of all patient seen at [facility] in and can have Dr. Laverta Baltimore review consult notes.   SW will inform RN of recommendation and dispositions.    Gabriel Newport, NP

## 2018-07-24 NOTE — BH Assessment (Addendum)
Tele Assessment Note   Patient Name: Gabriel Ashley MRN: [id number] Referring Physician: Dr. Ripley Fraise, MD Location of Patient: [facility] Location of Provider: [place] Department  Gabriel Ashley is a 52 y.o. male who came to [facility] ED due to experiencing SI. Pt shares he has a plan, though he won't share what it is. He states he has had suicide attempts before, though he can't remember how many and he can't remember when the last attempt was. Pt acknowledged he has been hospitalized for his depression/SI in the past, though he can't remember how many times. He states the last time he was hospitalized was 8 months ago and that it was at the [place] denies HI, AVH, and NSSIB.   Pt acknowledges he has been using cocaine. He states he does not know how much he has been using in quantity, but when asked if he can say how much he has been using in monetary amounts he states "a lot." Pt states the last day he used cocaine was a day ago.  Pt denies access to weapons. He states he lives by himself and he provided the name of the residence, though clinician is unsure of there this location/residence is. Pt states he is single. Pt shares he is not currently in therapy nor is he receiving psychiatry services; he states the last time he was receiving these services was 8 months ago.  Pt denies access to weapons. He denies any involvement in the court system. He denies any difficulties with his arms or legs and states he is capable of independently completing his ADLs.  It was not possible to request pt to answer questions for MSE due to his irritability. Pt's recent and remote memory appeared to be intact, though his refusal to answer some questions could have truly been a lack of an ability to answer. Pt was irritable throughout the assessment process. Pt's insight, judgement, and impulse control is impaired at this time.   Diagnosis: F33.2, Major depressive disorder,  Recurrent episode, Severe   Past Medical History:  Past Medical History:  Diagnosis Date  . Anemia   . Anxiety   . Asthma   . Cannabis use in remisison   . Cocaine use   . ED (erectile dysfunction)   . Hypertension   . Kidney disease   . OSA (obstructive sleep apnea)   . Post traumatic stress disorder (PTSD)   . Vitamin D deficiency     History reviewed. No pertinent surgical history.  Family History:  Family History  Problem Relation Age of Onset  . Breast cancer Mother   . Cancer Father        type unknown    Social History:  reports that he quit smoking about 8 months ago. He has a 31.00 pack-year smoking history. He has never used smokeless tobacco. He reports that he drank alcohol. He reports that he has current or past drug history. Drugs: Cocaine and Marijuana.  Additional Social History:  Alcohol / Drug Use Pain Medications: Please see MAR Prescriptions: Please see MAR Over the Counter: Please see MAR History of alcohol / drug use?: Yes Longest period of sobriety (when/how long): Unknown Substance #1 Name of Substance 1: Cocaine 1 - Age of First Use: Unknown 1 - Amount (size/oz): "A lot" 1 - Frequency: Daily 1 - Duration: Unknown 1 - Last Use / Amount: "A day ago"  CIWA: CIWA-Ar BP: (!) 180/123 Pulse Rate: 80 COWS:  Allergies: No Known Allergies  Home Medications:  (Not in a hospital admission)  OB/GYN Status:  No LMP for male patient.  General Assessment Data Location of Assessment: [facility] TTS Assessment: In system Is this a Tele or Face-to-Face Assessment?: Tele Assessment Is this an Initial Assessment or a Re-assessment for this encounter?: Initial Assessment Patient Accompanied by:: N/A Language Other than English: No Living Arrangements: Other (Comment)(Shared the name of his residence; clinician is unfamiliar) What gender do you identify as?: Male Marital status: Single Maiden name: Yankowski Pregnancy Status: No Living Arrangements:  (Shared the name of his residence; clinician is unfamiliar) Can pt return to current living arrangement?: Yes Admission Status: Voluntary Is patient capable of signing voluntary admission?: Yes Referral Source: Self/Family/Friend Insurance type: Occupational psychologist Care Plan Living Arrangements: (Shared the name of his residence; Pension scheme manager is unfamiliar) Legal Guardian: (N/A) Name of Psychiatrist: N/A Name of Therapist: N/A  Education Status Is patient currently in school?: No Is the patient employed, unemployed or receiving disability?: Receiving disability income  Risk to self with the past 6 months Suicidal Ideation: Yes-Currently Present Has patient been a risk to self within the past 6 months prior to admission? : Yes Suicidal Intent: Yes-Currently Present Has patient had any suicidal intent within the past 6 months prior to admission? : Yes Is patient at risk for suicide?: Yes Suicidal Plan?: Yes-Currently Present Has patient had any suicidal plan within the past 6 months prior to admission? : Yes Specify Current Suicidal Plan: Pt states he "doesn't know" but states he has one Access to Means: (Unknown since pt won't/can't share his plan) What has been your use of drugs/alcohol within the last 12 months?: Pt admits to cocaine use Previous Attempts/Gestures: Yes How many times?: (Pt's tates he "can't remember") Other Self Harm Risks: None noted Triggers for Past Attempts: Unpredictable Intentional Self Injurious Behavior: None Family Suicide History: Unable to assess Recent stressful life event(s): (Unknown) Persecutory voices/beliefs?: No Depression: Yes Depression Symptoms: Despondent, Feeling angry/irritable Substance abuse history and/or treatment for substance abuse?: (Unknown) Suicide prevention information given to non-admitted patients: Not applicable  Risk to Others within the past 6 months Homicidal Ideation: No Does patient have any  lifetime risk of violence toward others beyond the six months prior to admission? : No Thoughts of Harm to Others: No Current Homicidal Intent: No Current Homicidal Plan: No Access to Homicidal Means: No Identified Victim: None noted History of harm to others?: No Assessment of Violence: On admission Violent Behavior Description: None noted Does patient have access to weapons?: No(Pt denies) Criminal Charges Pending?: No Does patient have a court date: No Is patient on probation?: No  Psychosis Hallucinations: None noted Delusions: None noted  Mental Status Report Appearance/Hygiene: In scrubs Eye Contact: Poor Motor Activity: Other (Comment)(Pt is lying in his hospital bed) Speech: Pressured, Slow Level of Consciousness: Sleeping Mood: Irritable Affect: Irritable Anxiety Level: Minimal Thought Processes: Circumstantial Judgement: Impaired Orientation: Unable to assess Obsessive Compulsive Thoughts/Behaviors: Minimal  Cognitive Functioning Concentration: Decreased Memory: Unable to Assess Is patient IDD: No Insight: Fair Impulse Control: Fair Appetite: Good Have you had any weight changes? : No Change Sleep: Unable to Assess Total Hours of Sleep: (Unknown) Vegetative Symptoms: Unable to Assess  ADLScreening [facility]) Patient's cognitive ability adequate to safely complete daily activities?: Yes Patient able to express need for assistance with ADLs?: Yes Independently performs ADLs?: Yes (appropriate for developmental age)  Prior Inpatient Therapy Prior Inpatient Therapy: Yes Prior  Therapy Dates: Multiple, last was 8 months ago Prior Therapy Facilty/Provider(s): Human resources officer Reason for Treatment: MH, Depression  Prior Outpatient Therapy Prior Outpatient Therapy: Yes Prior Therapy Dates: Multiple Prior Therapy Facilty/Provider(s): Human resources officer Reason for Treatment: MH, Depression Does patient have an ACCT team?:  No Does patient have Intensive In-House Services?  : No Does patient have [facility] services? : No Does patient have P4CC services?: No  ADL Screening (condition at time of admission) Patient's cognitive ability adequate to safely complete daily activities?: Yes Is the patient deaf or have difficulty hearing?: No Does the patient have difficulty seeing, even when wearing glasses/contacts?: No Does the patient have difficulty concentrating, remembering, or making decisions?: No Patient able to express need for assistance with ADLs?: Yes Does the patient have difficulty dressing or bathing?: No Independently performs ADLs?: Yes (appropriate for developmental age) Does the patient have difficulty walking or climbing stairs?: No Weakness of Legs: None Weakness of Arms/Hands: None     Therapy Consults (therapy consults require a physician order) PT Evaluation Needed: No OT Evalulation Needed: No SLP Evaluation Needed: No Abuse/Neglect Assessment (Assessment to be complete while patient is alone) Abuse/Neglect Assessment Can Be Completed: Unable to assess, patient is non-responsive or altered mental status Values / Beliefs Cultural Requests During Hospitalization: None Spiritual Requests During Hospitalization: None Consults Spiritual Care Consult Needed: No Social Work Consult Needed: No Regulatory affairs officer (For Healthcare) Does Patient Have a Medical Advance Directive?: No Would patient like information on creating a medical advance directive?: No - Patient declined       Disposition: Lindon Romp NP reviewed pt's chart and information and determined pt should remain at the hospital overnight for observation for safety and stabilization due to his inability to contract for safety. This information was provided to pt's nurse, [place], at [phone].  Disposition Initial Assessment Completed for this Encounter: Yes Patient referred to: Other (Comment)(Pt will be observed overnight for  safety and stability)  This service was provided via telemedicine using a 2-way, interactive audio and video technology.  Names of all persons participating in this telemedicine service and their role in this encounter. Name: [name] Role: Patient  Name: Windell Hummingbird Role: Clinician    Dannielle Burn 07/24/2018 3:42 AM

## 2018-07-24 NOTE — ED Provider Notes (Signed)
[facility] EMERGENCY DEPARTMENT Provider Note   CSN: [id number] Arrival date & time: 07/23/18  2334     History   Chief Complaint Chief Complaint  Patient presents with  . Suicidal    HPI Gabriel Ashley is a 52 y.o. male.  Patient presents to the emergency department with a chief complaint of suicidal ideation.  He states that he is feeling suicidal because he is a drug addict.  He states that he uses cocaine.  His last use was yesterday.  He denies alcohol use.  He states that he can think of 1000 ways to kill himself, but does not have a specific plan on how he would do it tonight.  He denies any homicidal ideation.  Denies any hallucinations.  Denies being in any pain.  The history is provided by the patient. No language interpreter was used.    Past Medical History:  Diagnosis Date  . Anemia   . Anxiety   . Asthma   . Cannabis use in remisison   . Cocaine use   . ED (erectile dysfunction)   . Hypertension   . Kidney disease   . OSA (obstructive sleep apnea)   . Post traumatic stress disorder (PTSD)   . Vitamin D deficiency     Patient Active Problem List   Diagnosis Date Noted  . Cocaine dependence with cocaine-induced mood disorder ([place])   . MDD (major depressive disorder), recurrent episode ([place]) 04/25/2017  . Acute renal failure (ARF) ([place]) 04/20/2017  . Hypertensive urgency 04/20/2017  . Acute congestive heart failure with left ventricular diastolic dysfunction ([place]) 12/04/2016  . MDD (major depressive disorder), recurrent severe, without psychosis ([place]) 02/13/2015  . Essential hypertension   . Substance abuse ([place]) 10/26/2014  . Substance induced mood disorder ([place])   . Major depressive disorder, recurrent, severe without psychotic features ([place])   . Severe major depression ([place]) 10/23/2014  . PTSD (post-traumatic stress disorder)   . Essential hypertension, benign 05/11/2014  . Elevated serum creatinine 05/08/2014  . Depressive disorder  05/07/2014  . Suicidal ideation 05/07/2014  . Homicidal ideation 05/07/2014  . Anxiety 05/07/2014  . Severe recurrent major depression without psychotic features ([place]) 05/07/2014    History reviewed. No pertinent surgical history.      Home Medications    Prior to Admission medications   Medication Sig Start Date End Date Taking? Authorizing Provider  albuterol (PROVENTIL HFA;VENTOLIN HFA) 108 (90 Base) MCG/ACT inhaler Inhale 2 puffs into the lungs every 6 (six) hours as needed for wheezing or shortness of breath.   Yes [provider]  amLODipine (NORVASC) 10 MG tablet Take 1 tablet (10 mg total) by mouth daily. 04/30/17  Yes Niel Hummer, NP  busPIRone (BUSPAR) 5 MG tablet Take 5 mg by mouth 2 (two) times daily.   Yes [provider]  carvedilol (COREG) 12.5 MG tablet Take 12.5 mg by mouth daily.   Yes [provider]  cloNIDine (CATAPRES) 0.2 MG tablet Take 1 tablet (0.2 mg total) by mouth 2 (two) times daily. 04/30/17  Yes Niel Hummer, NP  FLUoxetine (PROZAC) 20 MG capsule Take 20 mg by mouth daily.   Yes [provider]  furosemide (LASIX) 40 MG tablet Take 40 mg by mouth daily.   Yes [provider]  hydrOXYzine (VISTARIL) 25 MG capsule Take 25 mg by mouth every 8 (eight) hours as needed for anxiety.    Yes [provider]  Multiple Vitamin (MULTIVITAMIN) tablet Take 1  tablet by mouth daily. 04/30/17  Yes Niel Hummer, NP  sildenafil (VIAGRA) 100 MG tablet Take 100 mg by mouth as needed for erectile dysfunction.  03/13/18  Yes [provider]  traZODone (DESYREL) 100 MG tablet Take 100 mg by mouth at bedtime as needed for sleep.   Yes [provider]    Family History Family History  Problem Relation Age of Onset  . Breast cancer Mother   . Cancer Father        type unknown    Social History Social History   Tobacco Use  . Smoking status: Former Smoker    Packs/day: 1.00    Years: 31.00     Pack years: 31.00    Last attempt to quit: 11/03/2017    Years since quitting: 0.7  . Smokeless tobacco: Never Used  Substance Use Topics  . Alcohol use: Not Currently    Comment: 2-3 years ago  . Drug use: Not Currently    Types: Cocaine, Marijuana     Allergies   Patient has no known allergies.   Review of Systems Review of Systems  All other systems reviewed and are negative.    Physical Exam Updated Vital Signs BP (!) 180/123 (BP Location: Right Arm)   Pulse 80   Temp 98.2 F (36.8 C) (Oral)   Resp 16   SpO2 100%   Physical Exam  Constitutional: He is oriented to person, place, and time. He appears well-developed and well-nourished.  HENT:  Head: Normocephalic and atraumatic.  Eyes: Conjunctivae and EOM are normal.  Neck: Normal range of motion.  Cardiovascular: Normal rate.  Pulmonary/Chest: Effort normal.  Abdominal: He exhibits no distension.  Musculoskeletal: Normal range of motion.  Neurological: He is alert and oriented to person, place, and time.  Skin: Skin is dry.  Psychiatric: He has a normal mood and affect. His behavior is normal. Judgment and thought content normal.  Nursing note and vitals reviewed.    ED Treatments / Results  Labs (all labs ordered are listed, but only abnormal results are displayed) Labs Reviewed  COMPREHENSIVE METABOLIC PANEL - Abnormal; Notable for the following components:      Result Value   Glucose, Bld 117 (*)    BUN 23 (*)    Creatinine, Ser 3.80 (*)    Calcium 8.3 (*)    GFR calc non Af Amer 17 (*)    GFR calc Af Amer 19 (*)    All other components within normal limits  ACETAMINOPHEN LEVEL - Abnormal; Notable for the following components:   Acetaminophen (Tylenol), Serum <10 (*)    All other components within normal limits  CBC - Abnormal; Notable for the following components:   Hemoglobin 11.3 (*)    HCT 37.0 (*)    All other components within normal limits  RAPID URINE DRUG SCREEN, HOSP PERFORMED -  Abnormal; Notable for the following components:   Cocaine POSITIVE (*)    Tetrahydrocannabinol POSITIVE (*)    All other components within normal limits  ETHANOL  SALICYLATE LEVEL    EKG None  Radiology No results found.  Procedures Procedures (including critical care time)  Medications Ordered in ED Medications - No data to display   Initial Impression / Assessment and Plan / ED Course  I have reviewed the triage vital signs and the nursing notes.  Pertinent labs & imaging results that were available during my care of the patient were reviewed by me and considered in my medical decision  making (see chart for details).     Patient with suicidal ideation.  No clear plan, but expresses that he can think of the thousand ways to kill himself.  States he is suicidal because of his substance abuse problems.  Will consult TTS for evaluation.  Per TTS, patient to remain overnight for safety as unable to contract for safety.  Final Clinical Impressions(s) / ED Diagnoses   Final diagnoses:  Suicidal ideation    ED Discharge Orders    None       Montine Circle, PA-C 07/24/18 0455    Ripley Fraise, MD 07/24/18 [phone]

## 2018-07-24 NOTE — Progress Notes (Addendum)
Pt meets inpatient criteria per Earleen Newport, NP. Pt is a English as a second language teacher and reports that he prefers to receive inpatient treatment at a [place] hospital, however all the [place] hospitals in [place] ([place], [place], [place], [place]) are either on diversion or are at [facility] capacity. Pt has been added to the transfer waiting list at [facility]. According to Mickel Baas in admissions @ [facility], there are 13 on the list ahead of pt.  CSW spoke with pt via phone and assessed that he is open to receiving treatment at other inpatient facilities.   Audree Camel, LCSW, [place] Disposition CSW [facility] BHH/TTS [phone]   Update 6:00pm: Referral information has been sent to the following hospitals for review:   [place]  [place]    Disposition will continue to assist with placement needs.   Audree Camel, LCSW, [place] Disposition [facility] BHH/TTS [phone]

## 2018-07-25 ENCOUNTER — Encounter (HOSPITAL_COMMUNITY): Payer: Self-pay | Admitting: *Deleted

## 2018-07-25 ENCOUNTER — Inpatient Hospital Stay (HOSPITAL_COMMUNITY)
Admission: AD | Admit: 2018-07-25 | Discharge: 2018-08-06 | DRG: 885 | Disposition: A | Discharge status: Home / Self Care | Payer: Federal, State, Local not specified - Other | Source: Intra-hospital | Attending: Psychiatry | Admitting: Psychiatry

## 2018-07-25 ENCOUNTER — Encounter (HOSPITAL_COMMUNITY): Payer: Self-pay | Admitting: Registered Nurse

## 2018-07-25 DIAGNOSIS — N529 Male erectile dysfunction, unspecified: Secondary | ICD-10-CM | POA: Diagnosis present

## 2018-07-25 DIAGNOSIS — I110 Hypertensive heart disease with heart failure: Secondary | ICD-10-CM | POA: Diagnosis present

## 2018-07-25 DIAGNOSIS — Z915 Personal history of self-harm: Secondary | ICD-10-CM

## 2018-07-25 DIAGNOSIS — G4733 Obstructive sleep apnea (adult) (pediatric): Secondary | ICD-10-CM | POA: Diagnosis present

## 2018-07-25 DIAGNOSIS — F17210 Nicotine dependence, cigarettes, uncomplicated: Secondary | ICD-10-CM | POA: Diagnosis present

## 2018-07-25 DIAGNOSIS — R45851 Suicidal ideations: Secondary | ICD-10-CM | POA: Diagnosis present

## 2018-07-25 DIAGNOSIS — F332 Major depressive disorder, recurrent severe without psychotic features: Principal | ICD-10-CM | POA: Diagnosis present

## 2018-07-25 DIAGNOSIS — J45909 Unspecified asthma, uncomplicated: Secondary | ICD-10-CM | POA: Diagnosis present

## 2018-07-25 DIAGNOSIS — F1424 Cocaine dependence with cocaine-induced mood disorder: Secondary | ICD-10-CM | POA: Diagnosis present

## 2018-07-25 DIAGNOSIS — Z79899 Other long term (current) drug therapy: Secondary | ICD-10-CM | POA: Diagnosis not present

## 2018-07-25 DIAGNOSIS — F419 Anxiety disorder, unspecified: Secondary | ICD-10-CM | POA: Diagnosis not present

## 2018-07-25 DIAGNOSIS — G4700 Insomnia, unspecified: Secondary | ICD-10-CM | POA: Diagnosis not present

## 2018-07-25 DIAGNOSIS — I509 Heart failure, unspecified: Secondary | ICD-10-CM | POA: Diagnosis present

## 2018-07-25 DIAGNOSIS — F4310 Post-traumatic stress disorder, unspecified: Secondary | ICD-10-CM | POA: Diagnosis not present

## 2018-07-25 DIAGNOSIS — F1290 Cannabis use, unspecified, uncomplicated: Secondary | ICD-10-CM | POA: Diagnosis present

## 2018-07-25 HISTORY — DX: Insomnia, unspecified: G47.00

## 2018-07-25 MED ORDER — ALBUTEROL SULFATE HFA 108 (90 BASE) MCG/ACT IN AERS: 2.0000 | INHALATION_SPRAY | Freq: Four times a day (QID) | RESPIRATORY_TRACT | Status: DC | PRN

## 2018-07-25 MED ORDER — BUSPIRONE HCL 5 MG PO TABS
5.0000 mg | ORAL_TABLET | Freq: Two times a day (BID) | ORAL | Status: DC
  Administered 2018-07-25 – 2018-07-26 (×2): 5 mg via ORAL
  Filled 2018-07-25 (×6): qty 1

## 2018-07-25 MED ORDER — HYDROXYZINE HCL 25 MG PO TABS
25.0000 mg | ORAL_TABLET | Freq: Three times a day (TID) | ORAL | Status: DC | PRN
  Administered 2018-07-25: 25 mg via ORAL
  Filled 2018-07-25: qty 1

## 2018-07-25 MED ORDER — AMLODIPINE BESYLATE 10 MG PO TABS
10.0000 mg | ORAL_TABLET | Freq: Every day | ORAL | Status: DC
  Administered 2018-07-26 – 2018-08-06 (×12): 10 mg via ORAL
  Filled 2018-07-25: qty 2
  Filled 2018-07-25 (×7): qty 1
  Filled 2018-07-25 (×2): qty 7
  Filled 2018-07-25 (×4): qty 1
  Filled 2018-07-25: qty 2
  Filled 2018-07-25 (×2): qty 1

## 2018-07-25 MED ORDER — FLUOXETINE HCL 20 MG PO CAPS
20.0000 mg | ORAL_CAPSULE | Freq: Every day | ORAL | Status: DC
  Administered 2018-07-26: 20 mg via ORAL
  Filled 2018-07-25 (×3): qty 1

## 2018-07-25 MED ORDER — CARVEDILOL 12.5 MG PO TABS
12.5000 mg | ORAL_TABLET | Freq: Every day | ORAL | Status: DC
  Administered 2018-07-26 – 2018-08-06 (×12): 12.5 mg via ORAL
  Filled 2018-07-25 (×4): qty 1
  Filled 2018-07-25: qty 7
  Filled 2018-07-25 (×9): qty 1
  Filled 2018-07-25: qty 7

## 2018-07-25 MED ORDER — CLONIDINE HCL 0.2 MG PO TABS
0.2000 mg | ORAL_TABLET | Freq: Two times a day (BID) | ORAL | Status: DC
  Administered 2018-07-25 – 2018-08-06 (×24): 0.2 mg via ORAL
  Filled 2018-07-25: qty 1
  Filled 2018-07-25: qty 14
  Filled 2018-07-25 (×5): qty 1
  Filled 2018-07-25: qty 14
  Filled 2018-07-25 (×8): qty 1
  Filled 2018-07-25: qty 14
  Filled 2018-07-25 (×2): qty 2
  Filled 2018-07-25 (×5): qty 1
  Filled 2018-07-25: qty 2
  Filled 2018-07-25 (×2): qty 1
  Filled 2018-07-25: qty 2
  Filled 2018-07-25 (×3): qty 1
  Filled 2018-07-25: qty 14
  Filled 2018-07-25 (×2): qty 1

## 2018-07-25 MED ORDER — ADULT MULTIVITAMIN W/MINERALS CH
1.0000 | ORAL_TABLET | Freq: Every day | ORAL | Status: DC
  Administered 2018-07-26 – 2018-08-06 (×12): 1 via ORAL
  Filled 2018-07-25 (×15): qty 1

## 2018-07-25 MED ORDER — FUROSEMIDE 40 MG PO TABS
40.0000 mg | ORAL_TABLET | Freq: Every day | ORAL | Status: DC
  Administered 2018-07-26 – 2018-08-06 (×12): 40 mg via ORAL
  Filled 2018-07-25 (×4): qty 1
  Filled 2018-07-25: qty 7
  Filled 2018-07-25 (×4): qty 1
  Filled 2018-07-25: qty 2
  Filled 2018-07-25: qty 1
  Filled 2018-07-25: qty 7
  Filled 2018-07-25 (×4): qty 1

## 2018-07-25 MED ORDER — TRAZODONE HCL 100 MG PO TABS
100.0000 mg | ORAL_TABLET | Freq: Every evening | ORAL | Status: DC | PRN
  Administered 2018-07-25 – 2018-07-31 (×7): 100 mg via ORAL
  Filled 2018-07-25 (×2): qty 1
  Filled 2018-07-25: qty 7
  Filled 2018-07-25 (×5): qty 1

## 2018-07-25 MED ORDER — HYDROXYZINE PAMOATE 25 MG PO CAPS
25.0000 mg | ORAL_CAPSULE | Freq: Three times a day (TID) | ORAL | Status: DC | PRN
  Filled 2018-07-25: qty 1

## 2018-07-25 NOTE — Progress Notes (Signed)
Patient ID: Gabriel Ashley, male   DOB: 10-Oct-1965, 52 y.o.   MRN: [id number] Patient came from [facility] ED this afternoon.  Patient is here for substance abuse and SI thoughts with no plan.  Patient is alert and oriented.  Patient admits to being very anxious and having trouble sleeping at night.  Patient lives alone and is unemployed.  Patient sts he has been admitted to this facility in the past.  Patient denies AVH.  Patient admits to using cocaine on a daily basis and smokes 1ppd cigerettes.  Patient sts he is treated at the [facility] in [place] for his healthcare needs.

## 2018-07-25 NOTE — Consult Note (Signed)
[place] psych Reassessment    Gabriel Ashley, 52 y.o., Yr. old male patient at [facility] reassessed via tele psych by this provider.  Chart reviewed and discussed with Dr. Hampton Abbot on 07/25/18.     On evaluation Gabriel Ashley reports that he continues to have suicidal thoughts and will not contract for safety.  Patient asked again what his plan is and patient responded "I know I'm going to hurt myself" and patient said nothing else.  Patient continues to get irritated with questioning.    During evaluation Gabriel Ashley is alert/oriented x 4; calm/cooperative; and mood is congruent with affect.  He does not appear to be responding to internal/external stimuli or delusional thoughts.  Patient denies homicidal ideation, psychosis, and paranoia; but continues to endorse suicidal ideation; not elaborating on what plan or intent is.  Patient will not contract for safety.   Recommendations: Continue to recommend inpatient psychiatric treatment  Disposition: Recommend psychiatric Inpatient admission when medically cleared.    Earleen Newport, NP

## 2018-07-25 NOTE — Progress Notes (Signed)
Adult Psychoeducational Group Note  Date:  07/25/2018 Time:  10:33 PM  Group Topic/Focus:  Wrap-Up Group:   The focus of this group is to help patients review their daily goal of treatment and discuss progress on daily workbooks.  Participation Level:  Active  Participation Quality:  Appropriate  Affect:  Appropriate  Cognitive:  Alert  Insight: Appropriate  Engagement in Group:  Engaged  Modes of Intervention:  Discussion  Additional Comments:  Pt stated that he is feeling sad. Pt stated that his goal is to start working on a discharge plan.   Wynelle Fanny R 07/25/2018, 10:33 PM

## 2018-07-25 NOTE — ED Notes (Signed)
Regular diet breakfest tray ordered

## 2018-07-25 NOTE — Progress Notes (Signed)
Pt accepted to [facility], Bed 306-1 Gabriel Rankin, NP is the accepting provider.  Dr.Greg Mallie Darting, MD is the attending provider.  Call report to [place] @ [facility] ED notified.   Pt is Voluntary.  Pt may be transported by [facility]  Pt scheduled  to arrive at BHH@1500   [name] [employer], MSW, [place] Disposition Clinical Social Work [phone] (cell) [phone] (office)

## 2018-07-25 NOTE — ED Notes (Signed)
Pt valuables given to [place] with [facility] to transport to [facility].

## 2018-07-25 NOTE — Progress Notes (Signed)
Patient sts he had a cell phone when he was in the [place], pt did not have a phone when he arrived here.  I called Gabi, RN at [facility] and they were not able to locate his cell phone.  Camera operator, Ronnie notified.

## 2018-07-25 NOTE — Progress Notes (Signed)
Pt observed in the dayroom, seen watching TV. Pt appears animated/anxious in affect and mood. No new c/o's. Pt denies SI/HI/AVH/Pain at this time. PRN vistaril and trazodone requested and given. Support and encouragement provided. Will continue with POC.

## 2018-07-26 DIAGNOSIS — F4310 Post-traumatic stress disorder, unspecified: Secondary | ICD-10-CM

## 2018-07-26 DIAGNOSIS — F419 Anxiety disorder, unspecified: Secondary | ICD-10-CM

## 2018-07-26 DIAGNOSIS — F1424 Cocaine dependence with cocaine-induced mood disorder: Secondary | ICD-10-CM

## 2018-07-26 DIAGNOSIS — R45851 Suicidal ideations: Secondary | ICD-10-CM

## 2018-07-26 MED ORDER — SERTRALINE HCL 50 MG PO TABS
50.0000 mg | ORAL_TABLET | Freq: Every day | ORAL | Status: DC
  Administered 2018-07-26 – 2018-07-27 (×2): 50 mg via ORAL
  Filled 2018-07-26 (×4): qty 1

## 2018-07-26 MED ORDER — HYDROXYZINE HCL 50 MG PO TABS
50.0000 mg | ORAL_TABLET | Freq: Three times a day (TID) | ORAL | Status: DC | PRN
  Administered 2018-07-26 – 2018-08-05 (×10): 50 mg via ORAL
  Filled 2018-07-26 (×11): qty 1
  Filled 2018-07-26: qty 10

## 2018-07-26 NOTE — Tx Team (Signed)
Interdisciplinary Treatment and Diagnostic Plan Update  07/26/2018 Time of Session:  0830AM Gabriel Ashley MRN: [id number]  Principal Diagnosis: MDD, recurrent, severe, without psychosis  Secondary Diagnoses: Active Problems:   MDD (major depressive disorder), recurrent severe, without psychosis ([place])   Current Medications:  Current Facility-Administered Medications  Medication Dose Route Frequency Provider Last Rate Last Dose  . albuterol (PROVENTIL HFA;VENTOLIN HFA) 108 (90 Base) MCG/ACT inhaler 2 puff  2 puff Inhalation Q6H PRN Rankin, Shuvon B, NP      . amLODipine (NORVASC) tablet 10 mg  10 mg Oral Daily Rankin, Shuvon B, NP   10 mg at 07/26/18 0931  . busPIRone (BUSPAR) tablet 5 mg  5 mg Oral BID Rankin, Shuvon B, NP   5 mg at 07/26/18 0932  . carvedilol (COREG) tablet 12.5 mg  12.5 mg Oral Daily Rankin, Shuvon B, NP   12.5 mg at 07/26/18 0932  . cloNIDine (CATAPRES) tablet 0.2 mg  0.2 mg Oral BID Rankin, Shuvon B, NP   0.2 mg at 07/26/18 0931  . FLUoxetine (PROZAC) capsule 20 mg  20 mg Oral Daily Rankin, Shuvon B, NP   20 mg at 07/26/18 0930  . furosemide (LASIX) tablet 40 mg  40 mg Oral Daily Rankin, Shuvon B, NP   40 mg at 07/26/18 0933  . hydrOXYzine (ATARAX/VISTARIL) tablet 25 mg  25 mg Oral Q8H PRN Sharma Covert, MD   25 mg at 07/25/18 2136  . multivitamin with minerals tablet 1 tablet  1 tablet Oral Daily Rankin, Shuvon B, NP   1 tablet at 07/26/18 0932  . traZODone (DESYREL) tablet 100 mg  100 mg Oral QHS PRN Rankin, Shuvon B, NP   100 mg at 07/25/18 2136   PTA Medications: Medications Prior to Admission  Medication Sig Dispense Refill Last Dose  . albuterol (PROVENTIL HFA;VENTOLIN HFA) 108 (90 Base) MCG/ACT inhaler Inhale 2 puffs into the lungs every 6 (six) hours as needed for wheezing or shortness of breath.   Past Week at Unknown time  . amLODipine (NORVASC) 10 MG tablet Take 1 tablet (10 mg total) by mouth daily. 30 tablet 0 Past Week at Unknown time  . busPIRone  (BUSPAR) 5 MG tablet Take 5 mg by mouth 2 (two) times daily.   Past Week at Unknown time  . carvedilol (COREG) 12.5 MG tablet Take 12.5 mg by mouth daily.   Past Week at Unknown time  . cloNIDine (CATAPRES) 0.2 MG tablet Take 1 tablet (0.2 mg total) by mouth 2 (two) times daily. 60 tablet 0 Past Week at Unknown time  . FLUoxetine (PROZAC) 20 MG capsule Take 20 mg by mouth daily.   Past Week at Unknown time  . furosemide (LASIX) 40 MG tablet Take 40 mg by mouth daily.   Past Week at Unknown time  . hydrOXYzine (VISTARIL) 25 MG capsule Take 25 mg by mouth every 8 (eight) hours as needed for anxiety.    Past Week at Unknown time  . Multiple Vitamin (MULTIVITAMIN) tablet Take 1 tablet by mouth daily.   Past Week at Unknown time  . sildenafil (VIAGRA) 100 MG tablet Take 100 mg by mouth as needed for erectile dysfunction.   11 Past Month at Unknown time  . traZODone (DESYREL) 100 MG tablet Take 100 mg by mouth at bedtime as needed for sleep.   Past Week at Unknown time    Treatment Modalities: Medication Management, Group therapy, Case management,  1 to 1 session with clinician, Psychoeducation, Recreational therapy.  Physician Treatment Plan for Primary Diagnosis: MDD, recurrent, severe, without psychosis Medication Management: Evaluate patient's response, side effects, and tolerance of medication regimen.  Therapeutic Interventions: 1 to 1 sessions, Unit Group sessions and Medication administration.  Evaluation of Outcomes: Not Met  Physician Treatment Plan for Secondary Diagnosis: Active Problems:   MDD (major depressive disorder), recurrent severe, without psychosis ([place])   Medication Management: Evaluate patient's response, side effects, and tolerance of medication regimen.  Therapeutic Interventions: 1 to 1 sessions, Unit Group sessions and Medication administration.  Evaluation of Outcomes: Not Met   RN Treatment Plan for Primary Diagnosis:MDD, recurrent, severe, without  psychosis Long Term Goal(s): Knowledge of disease and therapeutic regimen to maintain health will improve  Short Term Goals: Ability to remain free from injury will improve, Ability to disclose and discuss suicidal ideas and Ability to identify and develop effective coping behaviors will improve  Medication Management: RN will administer medications as ordered by provider, will assess and evaluate patient's response and provide education to patient for prescribed medication. RN will report any adverse and/or side effects to prescribing provider.  Therapeutic Interventions: 1 on 1 counseling sessions, Psychoeducation, Medication administration, Evaluate responses to treatment, Monitor vital signs and CBGs as ordered, Perform/monitor CIWA, COWS, AIMS and Fall Risk screenings as ordered, Perform wound care treatments as ordered.  Evaluation of Outcomes: Not Met   LCSW Treatment Plan for Primary Diagnosis: <principal problem not specified> Long Term Goal(s): Safe transition to appropriate next level of care at discharge, Engage patient in therapeutic group addressing interpersonal concerns.  Short Term Goals: Engage patient in aftercare planning with referrals and resources, Increase emotional regulation, Facilitate patient progression through stages of change regarding substance use diagnoses and concerns and Identify triggers associated with mental health/substance abuse issues  Therapeutic Interventions: Assess for all discharge needs, 1 to 1 time with Social worker, Explore available resources and support systems, Assess for adequacy in community support network, Educate family and significant other(s) on suicide prevention, Complete Psychosocial Assessment, Interpersonal group therapy.  Evaluation of Outcomes: Not Met   Progress in Treatment: Attending groups: No.New to unit. Continuing to assess.  Participating in groups: No. Taking medication as prescribed: Yes. Toleration medication:  Yes. Family/Significant other contact made: No, will contact:  family member if pt consents to collateral contact.  Patient understands diagnosis: Yes. Discussing patient identified problems/goals with staff: Yes. Medical problems stabilized or resolved: Yes. Denies suicidal/homicidal ideation: No. passive SI/able to contract for safety.  Issues/concerns per patient self-inventory: no Other: n/a  New problem(s) identified: No, Describe:  n/a  New Short Term/Long Term Goal(s): detox, medication management for mood stabilization; elimination of SI thoughts; development of comprehensive mental wellness/sobriety plan.   Patient Goals:  "I need to get back on meds for PTSD and depression."  Discharge Plan or Barriers: CSW assessing for appropriate referrals. [place] pamphlet, Mobile Crisis information, and AA/NA information provided to patient for additional community support and resources.   Reason for Continuation of Hospitalization: Depression Medication stabilization Suicidal ideation Withdrawal symptoms Other; describe irritability/mood lability  Estimated Length of Stay: Tuesday, 07/30/18  Attendees: Patient: 07/26/2018 11:20 AM  Physician: Dr. Nancy Fetter MD 07/26/2018 11:20 AM  Nursing: Yetta Flock RN; Elberta Fortis RN 07/26/2018 11:20 AM  RN Care Manager:x 07/26/2018 11:20 AM  Social Worker: Janice Norrie LCSW 07/26/2018 11:20 AM  Recreational Therapist: x 07/26/2018 11:20 AM  Other: Lindell Spar NP 07/26/2018 11:20 AM  Other:  07/26/2018 11:20 AM  Other: 07/26/2018 11:20 AM    Scribe for Treatment Team:  Avelina Laine, LCSW 07/26/2018 11:20 AM

## 2018-07-26 NOTE — Progress Notes (Signed)
Patient did attend the evening speaker AA meeting.  

## 2018-07-26 NOTE — BHH Suicide Risk Assessment (Signed)
[facility] Admission Suicide Risk Assessment   Nursing information obtained from:  Patient Demographic factors:  Male, Living alone, Unemployed, Low socioeconomic status Current Mental Status:  Self-harm thoughts Loss Factors:  Financial problems / change in socioeconomic status Historical Factors:    Risk Reduction Factors:     Total Time spent with patient: 30 minutes Principal Problem: PTSD (post-traumatic stress disorder) Diagnosis:   Patient Active Problem List   Diagnosis Date Noted  . Cocaine dependence with cocaine-induced mood disorder ([place]) [F14.24]   . MDD (major depressive disorder), recurrent episode ([place]) [F33.9] 04/25/2017  . Acute renal failure (ARF) ([place]) [N17.9] 04/20/2017  . Hypertensive urgency [I16.0] 04/20/2017  . Acute congestive heart failure with left ventricular diastolic dysfunction ([place]) [I50.31] 12/04/2016  . Essential hypertension [I10]   . Substance abuse ([place]) [F19.10] 10/26/2014  . Substance induced mood disorder ([place]) [F19.94]   . Major depressive disorder, recurrent, severe without psychotic features ([place]) [F33.2]   . Severe major depression ([place]) [F32.2] 10/23/2014  . PTSD (post-traumatic stress disorder) [F43.10]   . Essential hypertension, benign [I10] 05/11/2014  . Elevated serum creatinine [R79.89] 05/08/2014  . Depressive disorder [F32.9] 05/07/2014  . Suicidal ideation [R45.851] 05/07/2014  . Homicidal ideation [R45.850] 05/07/2014  . Anxiety [F41.9] 05/07/2014  . Severe recurrent major depression without psychotic features ([place]) [F33.2] 05/07/2014   Subjective Data: see H&P  Continued Clinical Symptoms:    The "Alcohol Use Disorders Identification Test", Guidelines for Use in Primary Care, Second Edition.  World Pharmacologist [facility]). Score between 0-7:  no or low risk or alcohol related problems. Score between 8-15:  moderate risk of alcohol related problems. Score between 16-19:  high risk of alcohol related problems. Score 20 or  above:  warrants further diagnostic evaluation for alcohol dependence and treatment.    Psychiatric Specialty Exam: Physical Exam  Nursing note and vitals reviewed.     Blood pressure (!) 141/96, pulse 73, temperature 97.9 F (36.6 C), temperature source Oral, resp. rate 18, height 5\' 11"  (1.803 m), weight 86.2 kg, SpO2 100 %.Body mass index is 26.5 kg/m.      COGNITIVE FEATURES THAT CONTRIBUTE TO RISK:  Closed-mindedness    SUICIDE RISK:   Severe:  Frequent, intense, and enduring suicidal ideation, specific plan, no subjective intent, but some objective markers of intent (i.e., choice of lethal method), the method is accessible, some limited preparatory behavior, evidence of impaired self-control, severe dysphoria/symptomatology, multiple risk factors present, and few if any protective factors, particularly a lack of social support.  PLAN OF CARE: see H&P  I certify that inpatient services furnished can reasonably be expected to improve the patient's condition.   Pennelope Bracken, MD 07/26/2018, 2:40 PM

## 2018-07-26 NOTE — Progress Notes (Signed)
D.  Pt pleasant on approach, no complaints voiced.  Pt was positive for evening AA group, observed engaged in appropriate interaction with peers on the unit.  Pt denies SI/HI/AVH at this time.  A.  Support and encouragement offered, medication given as ordered  R.  Pt remains safe on the unit, will continue to monitor.
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
## 2018-07-26 NOTE — BHH Suicide Risk Assessment (Signed)
[place] INPATIENT:  Family/Significant Other Suicide Prevention Education  Suicide Prevention Education:  Patient Refusal for Family/Significant Other Suicide Prevention Education: The patient Gabriel Ashley has refused to provide written consent for family/significant other to be provided Family/Significant Other Suicide Prevention Education during admission and/or prior to discharge.  Physician notified.  SPE completed with pt, as pt refused to consent to family contact. SPI pamphlet provided to pt and pt was encouraged to share information with support network, ask questions, and talk about any concerns relating to SPE. Pt denies access to guns/firearms and verbalized understanding of information provided. Mobile Crisis information also provided to pt.   Avelina Laine LCSW 07/26/2018, 11:32 AM

## 2018-07-26 NOTE — Progress Notes (Signed)
Recreation Therapy Notes  Date: 11.8.19 Time: 0930 Location: 300 Hall Dayroom  Group Topic: Stress Management  Goal Area(s) Addresses:  Patient will verbalize importance of using healthy stress management.  Patient will identify positive emotions associated with healthy stress management.   Intervention: Stress Management  Activity :  Meditation.  LRT introduced the stress management technique of meditation.  LRT played a meditation that allowed patients to embody the stillness of a mountain.  Patients were to listen and follow along as the meditation played.  Education:  Stress Management, Discharge Planning.   Education Outcome: Acknowledges edcuation/In group clarification offered/Needs additional education  Clinical Observations/Feedback: Pt did not attend group.     Victorino Sparrow, LRT/CTRS     Victorino Sparrow A 07/26/2018 11:17 AM

## 2018-07-26 NOTE — Plan of Care (Signed)
  Problem: Education: Goal: Mental status will improve Outcome: Progressing   Problem: Activity: Goal: Interest or engagement in activities will improve Outcome: Progressing   D: Pt alert and oriented on the unit. Pt engaging with RN staff and other pts. Pt denies SI/HI, A/VH. Pt is pleasant and cooperative. A: Education, support and encouragement provided, q15 minute safety checks remain in effect. Medications administered per MD orders. R: No reactions/side effects to medicine noted. Pt denies any concerns at this time, and verbally contracts for safety. Pt ambulating on the unit with no issues. Pt remains safe on and off the unit.

## 2018-07-26 NOTE — [facility] Group Notes (Signed)
[facility] Group Notes:  (Nursing/MHT/Case Management/Adjunct)  Date:  07/26/2018  Time:  3:00 PM  Type of Therapy:  Nurse Education  Participation Level:  Active  Participation Quality:  Appropriate  Affect:  Appropriate  Cognitive:  Alert and Appropriate  Insight:  Appropriate and Improving  Engagement in Group:  Engaged  Modes of Intervention:  Activity and Discussion  Summary of Progress/Problems: Patient appropriately participated in group.  Gabriel Ashley 07/26/2018, 3:00 PM

## 2018-07-26 NOTE — H&P (Signed)
Psychiatric Admission Assessment Adult  Patient Identification: Gabriel Ashley MRN:  [id number] Date of Evaluation:  07/26/2018 Chief Complaint:  mdd cocaine use disorder Principal Diagnosis: PTSD (post-traumatic stress disorder) Diagnosis:   Patient Active Problem List   Diagnosis Date Noted  . Cocaine dependence with cocaine-induced mood disorder ([place]) [F14.24]   . MDD (major depressive disorder), recurrent episode ([place]) [F33.9] 04/25/2017  . Acute renal failure (ARF) ([place]) [N17.9] 04/20/2017  . Hypertensive urgency [I16.0] 04/20/2017  . Acute congestive heart failure with left ventricular diastolic dysfunction ([place]) [I50.31] 12/04/2016  . Essential hypertension [I10]   . Substance abuse ([place]) [F19.10] 10/26/2014  . Substance induced mood disorder ([place]) [F19.94]   . Major depressive disorder, recurrent, severe without psychotic features ([place]) [F33.2]   . Severe major depression ([place]) [F32.2] 10/23/2014  . PTSD (post-traumatic stress disorder) [F43.10]   . Essential hypertension, benign [I10] 05/11/2014  . Elevated serum creatinine [R79.89] 05/08/2014  . Depressive disorder [F32.9] 05/07/2014  . Suicidal ideation [R45.851] 05/07/2014  . Homicidal ideation [R45.850] 05/07/2014  . Anxiety [F41.9] 05/07/2014  . Severe recurrent major depression without psychotic features [facility]) [F33.2] 05/07/2014   History of Present Illness:  Gabriel Ashley is a 52 y/o M with history of MDD, PTSD, and cocaine use disorder who was admitted voluntarily from [facility] where he presented with worsening depression, SI with plan, and worsening use of crack cocaine. He also reported being off of his psychotropic medications for several months. He was medically cleared and then transferred to [facility] for additional treatment and stabilization.  Upon evaluation, pt was seen in the office with SW team present. Pt has significantly depressed affect, somewhat increased latency of responses, poor eye contact, and low speech. His  responses are somewhat minimal and vague at times, limiting his ability as a historian. However, he is generally cooperative with the interview. Pt shares, "I've just been feeling suicidal." He identifies plan of walking into traffic. He identifies main stressor as struggling with use of crack cocaine. When asked about cocaine use, pt is guarded and he does not share exactly how much he has been using aside from reporting that he uses "all day" multiple times per day. He denies HI/AH/VH. He endorses depressive symptoms of initial insomnia, anhedonia, guilty feelings, low energy, and poor concentration. He denies symptoms of mania/hypomania and OCD. He endorses PTSD symptoms of flashbacks, nightmares, hypervigilance, hyperarousal, and avoidance. He smokes 1ppd and he denies alcohol use; he denies other illicit substance use.  Discussed with patient about treatment options. He has previous trials of prozac, buspar, and lexaparo, and he reports these medications have not been helpful. He is in agreement to trial of zoloft, vistaril, and trazodone. He is in agreement to be referred to substance use treatment, and he voices preference to be referred to the [place]. Pt was in agreement with the above plan, and he had no further questions, comments, or concerns.  Associated Signs/Symptoms: Depression Symptoms:  depressed mood, anhedonia, insomnia, fatigue, feelings of worthlessness/guilt, difficulty concentrating, suicidal thoughts with specific plan, anxiety, loss of energy/fatigue, disturbed sleep, (Hypo) Manic Symptoms:  NA Anxiety Symptoms:  Excessive Worry, Psychotic Symptoms:  NA PTSD Symptoms: Re-experiencing:  Flashbacks Intrusive Thoughts Nightmares Hypervigilance:  Yes Hyperarousal:  Difficulty Concentrating Emotional Numbness/Detachment Increased Startle Response Irritability/Anger Sleep Avoidance:  Decreased Interest/Participation Foreshortened Future Total Time spent with patient: 1  hour  Past Psychiatric History:  -Previous diagnoses of MDD, PTSD, cocaine use disorder - about 5 previous inpt stays with last to [facility] in 04/2017 -  most recent outpatient provider at [facility] in [place] - last follow up 8 months ago - 1 previous suicide attempt "several years ago" and pt states he cannot recall the method  Is the patient at risk to self? Yes.    Has the patient been a risk to self in the past 6 months? Yes.    Has the patient been a risk to self within the distant past? Yes.    Is the patient a risk to others? Yes.    Has the patient been a risk to others in the past 6 months? Yes.    Has the patient been a risk to others within the distant past? Yes.     Prior Inpatient Therapy:   Prior Outpatient Therapy:    Alcohol Screening: Patient refused Alcohol Screening Tool: Yes Substance Abuse History in the last 12 months:  Yes.   Consequences of Substance Abuse: Medical Consequences:  worsened mood and anxiety symptoms Previous Psychotropic Medications: Yes  Psychological Evaluations: Yes  Past Medical History:  Past Medical History:  Diagnosis Date  . Anemia   . Anxiety   . Asthma   . Cannabis use in remisison   . Cocaine use   . ED (erectile dysfunction)   . Hypertension   . Insomnia   . Kidney disease   . OSA (obstructive sleep apnea)   . Post traumatic stress disorder (PTSD)   . Vitamin D deficiency    History reviewed. No pertinent surgical history. Family History:  Family History  Problem Relation Age of Onset  . Breast cancer Mother   . Cancer Father        type unknown   Family Psychiatric  History: denies family psychiatric history. Tobacco Screening: Have you used any form of tobacco in the last 30 days? (Cigarettes, Smokeless Tobacco, Cigars, and/or Pipes): Yes Counseled patient on smoking cessation including recognizing danger situations, developing coping skills and basic information about quitting provided: Refused/Declined practical  counseling Social History: Pt was born in [place] and he has lived in the [place] area for 5 years. He lives alone. He completed HS. He is not working and receives disability. He has [place] in [employer], and he reports trauma of combat exposure in [place]. He is divorced. He has one adult son. He denies legal history.  Social History   Substance and Sexual Activity  Alcohol Use Not Currently   Comment: 2-3 years ago     Social History   Substance and Sexual Activity  Drug Use Yes  . Types: Cocaine, Marijuana    Additional Social History:      History of alcohol / drug use?: Yes                    Allergies:  No Known Allergies Lab Results: No results found for this or any previous visit (from the past 70 hour(s)).  Blood Alcohol level:  Lab Results  Component Value Date   ETH <10 07/23/2018   ETH <5 48/54/6270    Metabolic Disorder Labs:  No results found for: HGBA1C, MPG No results found for: PROLACTIN No results found for: CHOL, TRIG, HDL, CHOLHDL, VLDL, LDLCALC  Current Medications: Current Facility-Administered Medications  Medication Dose Route Frequency Provider Last Rate Last Dose  . albuterol (PROVENTIL HFA;VENTOLIN HFA) 108 (90 Base) MCG/ACT inhaler 2 puff  2 puff Inhalation Q6H PRN Rankin, Shuvon B, NP      . amLODipine (NORVASC) tablet 10 mg  10 mg Oral Daily Rankin, Shuvon B, NP   10 mg at 07/26/18 0931  . carvedilol (COREG) tablet 12.5 mg  12.5 mg Oral Daily Rankin, Shuvon B, NP   12.5 mg at 07/26/18 0932  . cloNIDine (CATAPRES) tablet 0.2 mg  0.2 mg Oral BID Rankin, Shuvon B, NP   0.2 mg at 07/26/18 0931  . furosemide (LASIX) tablet 40 mg  40 mg Oral Daily Rankin, Shuvon B, NP   40 mg at 07/26/18 0933  . hydrOXYzine (ATARAX/VISTARIL) tablet 50 mg  50 mg Oral Q8H PRN Pennelope Bracken, MD      . multivitamin with minerals tablet 1 tablet  1 tablet Oral Daily Rankin, Shuvon B, NP   1 tablet at  07/26/18 0932  . sertraline (ZOLOFT) tablet 50 mg  50 mg Oral Daily Pennelope Bracken, MD   50 mg at 07/26/18 1313  . traZODone (DESYREL) tablet 100 mg  100 mg Oral QHS PRN Rankin, Shuvon B, NP   100 mg at 07/25/18 2136   PTA Medications: Medications Prior to Admission  Medication Sig Dispense Refill Last Dose  . albuterol (PROVENTIL HFA;VENTOLIN HFA) 108 (90 Base) MCG/ACT inhaler Inhale 2 puffs into the lungs every 6 (six) hours as needed for wheezing or shortness of breath.   Past Week at Unknown time  . amLODipine (NORVASC) 10 MG tablet Take 1 tablet (10 mg total) by mouth daily. 30 tablet 0 Past Week at Unknown time  . busPIRone (BUSPAR) 5 MG tablet Take 5 mg by mouth 2 (two) times daily.   Past Week at Unknown time  . carvedilol (COREG) 12.5 MG tablet Take 12.5 mg by mouth daily.   Past Week at Unknown time  . cloNIDine (CATAPRES) 0.2 MG tablet Take 1 tablet (0.2 mg total) by mouth 2 (two) times daily. 60 tablet 0 Past Week at Unknown time  . FLUoxetine (PROZAC) 20 MG capsule Take 20 mg by mouth daily.   Past Week at Unknown time  . furosemide (LASIX) 40 MG tablet Take 40 mg by mouth daily.   Past Week at Unknown time  . hydrOXYzine (VISTARIL) 25 MG capsule Take 25 mg by mouth every 8 (eight) hours as needed for anxiety.    Past Week at Unknown time  . Multiple Vitamin (MULTIVITAMIN) tablet Take 1 tablet by mouth daily.   Past Week at Unknown time  . sildenafil (VIAGRA) 100 MG tablet Take 100 mg by mouth as needed for erectile dysfunction.   11 Past Month at Unknown time  . traZODone (DESYREL) 100 MG tablet Take 100 mg by mouth at bedtime as needed for sleep.   Past Week at Unknown time    Musculoskeletal: Strength & Muscle Tone: within normal limits Gait & Station: normal Patient leans: N/A  Psychiatric Specialty Exam: Physical Exam  Nursing note and vitals reviewed.   Review of Systems  Constitutional: Negative for chills and fever.  Respiratory: Negative for cough and  shortness of breath.   Cardiovascular: Negative for chest pain.  Gastrointestinal: Negative for abdominal pain, heartburn, nausea and vomiting.  Psychiatric/Behavioral: Positive for depression, substance abuse and suicidal ideas. Negative for hallucinations. The patient is nervous/anxious and has insomnia.     Blood pressure (!) 141/96, pulse 73, temperature 97.9 F (36.6 C), temperature source Oral, resp. rate 18, height 5\' 11"  (1.803 m), weight 86.2 kg, SpO2 100 %.Body mass index is 26.5 kg/m.  General Appearance: Casual  Eye Contact:  Good  Speech:  Clear and Coherent  and Normal Rate  Volume:  Normal  Mood:  Depressed  Affect:  Congruent  Thought Process:  Coherent and Goal Directed  Orientation:  Full (Time, Place, and Person)  Thought Content:  Logical  Suicidal Thoughts:  Yes.  with intent/plan  Homicidal Thoughts:  No  Memory:  Immediate;   Fair Recent;   Fair Remote;   Fair  Judgement:  Poor  Insight:  Lacking  Psychomotor Activity:  Normal  Concentration:  Concentration: Fair  Recall:  [employer] of Knowledge:  Fair  Language:  Poor  Akathisia:  No  Handed:    AIMS (if indicated):     Assets:  Resilience Social Support  ADL's:  Intact  Cognition:  WNL  Sleep:  Number of Hours: 6.5    Treatment Plan Summary: Daily contact with patient to assess and evaluate symptoms and progress in treatment and Medication management  Observation Level/Precautions:  15 minute checks  Laboratory:  CBC Chemistry Profile HbAIC UDS UA  Psychotherapy:  Encourage participation in groups and therapeutic milieu   Medications:  Start zoloft 50mg  po qDay. DC prozac. Start vistaril 50mg  po q6h prn anxiety. DC buspar. Start trazodone 50mg  po qhs. Continue all other current orders/ PRN's without changes - see MAR.  Consultations:    Discharge Concerns:    Estimated LOS: 5-7 days  Other:     Physician Treatment Plan for Primary Diagnosis: PTSD (post-traumatic stress disorder) Long  Term Goal(s): Improvement in symptoms so as ready for discharge  Short Term Goals: Ability to identify and develop effective coping behaviors will improve  Physician Treatment Plan for Secondary Diagnosis: Principal Problem:   PTSD (post-traumatic stress disorder) Active Problems:   Cocaine dependence with cocaine-induced mood disorder ([place])  Long Term Goal(s): Improvement in symptoms so as ready for discharge  Short Term Goals: Ability to identify triggers associated with substance abuse/mental health issues will improve  I certify that inpatient services furnished can reasonably be expected to improve the patient's condition.    Pennelope Bracken, MD 11/8/20192:27 PM

## 2018-07-27 MED ORDER — SERTRALINE HCL 100 MG PO TABS
100.0000 mg | ORAL_TABLET | Freq: Every day | ORAL | Status: DC
  Administered 2018-07-28 – 2018-08-06 (×10): 100 mg via ORAL
  Filled 2018-07-27 (×4): qty 1
  Filled 2018-07-27: qty 7
  Filled 2018-07-27 (×3): qty 1
  Filled 2018-07-27: qty 7
  Filled 2018-07-27 (×5): qty 1

## 2018-07-27 NOTE — Progress Notes (Signed)
D. Pt presents with a depressed affect/mood- isolative to room for much of the morning. Per pt's self inventory, pt rates his depression, hopelessness and anxiety a 5/4/5, respectively. Pt writes that his goal today is "being patient" and that he will "meditate" to help him meet that goal. Pt endorses passive SI with no plan and agrees to contact staff before acting on any harmful thoughts.  A. Labs and vitals monitored. Pt compliant with medications. Pt supported emotionally and encouraged to express concerns and ask questions.   R. Pt remains safe with 15 minute checks. Will continue POC.

## 2018-07-27 NOTE — [facility] Group Notes (Signed)
[facility] Group Notes:  (Nursing)  Date:  07/27/2018  Time: 1 15 PM Type of Therapy:  Nurse Education  Participation Level:  Active  Participation Quality:  Appropriate and Attentive  Affect:  Appropriate  Cognitive:  Appropriate  Insight:  Appropriate  Engagement in Group:  Engaged  Modes of Intervention:  Discussion and Education  Summary of Progress/Problems: Nurse led group played a non competitive learning/communication board game that fosters listening skills as well as self expression.  Gabriel Ashley 07/27/2018, 4:50 PM

## 2018-07-27 NOTE — Progress Notes (Signed)
[facility] MD Progress Note  07/27/2018 11:18 AM Gabriel Ashley  MRN:  [id number] Subjective:  "I feel ok but sometimes I don't feel well but this is how it's always been". Principal Problem: Severe recurrent major depression without psychotic features ([place]) Diagnosis:   Patient Active Problem List   Diagnosis Date Noted  . Cocaine dependence with cocaine-induced mood disorder ([place]) [F14.24]   . MDD (major depressive disorder), recurrent episode ([place]) [F33.9] 04/25/2017  . Acute renal failure (ARF) ([place]) [N17.9] 04/20/2017  . Hypertensive urgency [I16.0] 04/20/2017  . Acute congestive heart failure with left ventricular diastolic dysfunction ([place]) [I50.31] 12/04/2016  . Essential hypertension [I10]   . Substance abuse ([place]) [F19.10] 10/26/2014  . Substance induced mood disorder ([place]) [F19.94]   . Major depressive disorder, recurrent, severe without psychotic features ([place]) [F33.2]   . Severe major depression ([place]) [F32.2] 10/23/2014  . PTSD (post-traumatic stress disorder) [F43.10]   . Essential hypertension, benign [I10] 05/11/2014  . Elevated serum creatinine [R79.89] 05/08/2014  . Depressive disorder [F32.9] 05/07/2014  . Suicidal ideation [R45.851] 05/07/2014  . Homicidal ideation [R45.850] 05/07/2014  . Anxiety [F41.9] 05/07/2014  . Severe recurrent major depression without psychotic features ([place]) [F33.2] 05/07/2014   Total Time spent with patient: 30 minutes  Past Psychiatric History: MDD, Cocaine use Disorder, PTSD  HPI: Gabriel Ashley is a 52 y/o M with history of MDD, PTSD, and cocaine use disorder who was admitted voluntarily from [facility] where he presented with worsening depression, SI with plan, and worsening use of crack cocaine. He also reported being off of his psychotropic medications for several months. He was medically cleared and then transferred to [facility] for additional treatment and stabilization.  Upon evaluation, pt was seen in his room. Pt has significantly depressed affect,  somewhat increased latency of responses, poor eye contact, and low speech. His responses are somewhat minimal and vague at times, limiting his ability as a historian. However, he is generally cooperative with the interview. He reports mood as "fair" which he rated as a 4 on a scale of 1-10 with 10 being the worst depression although admits suicidal ideation with no plans at this time. He denies homicidal ideation, auditory or visual hallucinations or paranoia. He also denied any cravings, urges or withdrawal symptoms at this time. He is awaiting placement to long-term rehabilitation for cocaine use disorder.  Past Medical History:  Past Medical History:  Diagnosis Date  . Anemia   . Anxiety   . Asthma   . Cannabis use in remisison   . Cocaine use   . ED (erectile dysfunction)   . Hypertension   . Insomnia   . Kidney disease   . OSA (obstructive sleep apnea)   . Post traumatic stress disorder (PTSD)   . Vitamin D deficiency    History reviewed. No pertinent surgical history. Family History:  Family History  Problem Relation Age of Onset  . Breast cancer Mother   . Cancer Father        type unknown   Family Psychiatric  History: See HPI Social History:  Social History   Substance and Sexual Activity  Alcohol Use Not Currently   Comment: 2-3 years ago     Social History   Substance and Sexual Activity  Drug Use Yes  . Types: Cocaine, Marijuana    Social History   Socioeconomic History  . Marital status: Single    Spouse name: Not on file  . Number of children: 1  . Years of education: Not  on file  . Highest education level: Not on file  Occupational History  . Not on file  Social Needs  . Financial resource strain: Not on file  . Food insecurity:    Worry: Not on file    Inability: Not on file  . Transportation needs:    Medical: Not on file    Non-medical: Not on file  Tobacco Use  . Smoking status: Former Smoker    Packs/day: 1.00    Years: 31.00    Pack  years: 31.00    Last attempt to quit: 11/03/2017    Years since quitting: 0.7  . Smokeless tobacco: Never Used  Substance and Sexual Activity  . Alcohol use: Not Currently    Comment: 2-3 years ago  . Drug use: Yes    Types: Cocaine, Marijuana  . Sexual activity: Yes  Lifestyle  . Physical activity:    Days per week: Not on file    Minutes per session: Not on file  . Stress: Not on file  Relationships  . Social connections:    Talks on phone: Not on file    Gets together: Not on file    Attends religious service: Not on file    Active member of club or organization: Not on file    Attends meetings of clubs or organizations: Not on file    Relationship status: Not on file  Other Topics Concern  . Not on file  Social History Narrative  . Not on file   Additional Social History:    History of alcohol / drug use?: Yes                    Sleep: Good  Appetite:  Good  Current Medications: Current Facility-Administered Medications  Medication Dose Route Frequency Provider Last Rate Last Dose  . albuterol (PROVENTIL HFA;VENTOLIN HFA) 108 (90 Base) MCG/ACT inhaler 2 puff  2 puff Inhalation Q6H PRN Rankin, Shuvon B, NP      . amLODipine (NORVASC) tablet 10 mg  10 mg Oral Daily Rankin, Shuvon B, NP   10 mg at 07/27/18 0824  . carvedilol (COREG) tablet 12.5 mg  12.5 mg Oral Daily Rankin, Shuvon B, NP   12.5 mg at 07/27/18 0824  . cloNIDine (CATAPRES) tablet 0.2 mg  0.2 mg Oral BID Rankin, Shuvon B, NP   0.2 mg at 07/27/18 0824  . furosemide (LASIX) tablet 40 mg  40 mg Oral Daily Rankin, Shuvon B, NP   40 mg at 07/27/18 0824  . hydrOXYzine (ATARAX/VISTARIL) tablet 50 mg  50 mg Oral Q8H PRN Pennelope Bracken, MD   50 mg at 07/26/18 2151  . multivitamin with minerals tablet 1 tablet  1 tablet Oral Daily Rankin, Shuvon B, NP   1 tablet at 07/27/18 0824  . sertraline (ZOLOFT) tablet 50 mg  50 mg Oral Daily Pennelope Bracken, MD   50 mg at 07/27/18 0824  . traZODone  (DESYREL) tablet 100 mg  100 mg Oral QHS PRN Rankin, Shuvon B, NP   100 mg at 07/26/18 2151    Lab Results: No results found for this or any previous visit (from the past 48 hour(s)).  Blood Alcohol level:  Lab Results  Component Value Date   ETH <10 07/23/2018   ETH <5 16/06/9603    Metabolic Disorder Labs: No results found for: HGBA1C, MPG No results found for: PROLACTIN No results found for: CHOL, TRIG, HDL, CHOLHDL, VLDL, LDLCALC  Physical Findings: AIMS:  Facial and Oral Movements Muscles of Facial Expression: None, normal Lips and Perioral Area: None, normal Jaw: None, normal Tongue: None, normal,Extremity Movements Upper (arms, wrists, hands, fingers): None, normal Lower (legs, knees, ankles, toes): None, normal, Trunk Movements Neck, shoulders, hips: None, normal, Overall Severity Severity of abnormal movements (highest score from questions above): None, normal Incapacitation due to abnormal movements: None, normal Patient's awareness of abnormal movements (rate only patient's report): No Awareness, Dental Status Current problems with teeth and/or dentures?: No Does patient usually wear dentures?: No  CIWA:    COWS:     Musculoskeletal: Strength & Muscle Tone: within normal limits Gait & Station: normal Patient leans: N/A  Psychiatric Specialty Exam: Physical Exam  Constitutional: He is oriented to person, place, and time. He appears well-developed.  Neurological: He is alert and oriented to person, place, and time.  Psychiatric: His speech is normal and behavior is normal. Thought content normal. Cognition and memory are normal. He expresses impulsivity. He exhibits a depressed mood.    Review of Systems  Constitutional: Negative.   Neurological: Negative for dizziness, tingling and headaches.  Psychiatric/Behavioral: Positive for depression and substance abuse. Negative for hallucinations. The patient is not nervous/anxious.     Blood pressure (!) 159/107,  pulse 77, temperature (!) 97.5 F (36.4 C), temperature source Oral, resp. rate 18, height 5\' 11"  (1.803 m), weight 86.2 kg, SpO2 100 %.Body mass index is 26.5 kg/m.  General Appearance: Fairly Groomed  Eye Contact:  Poor  Speech:  Slow  Volume:  Decreased  Mood:  Depressed  Affect:  Appropriate  Thought Process:  Goal Directed  Orientation:  Full (Time, Place, and Person)  Thought Content:  Logical  Suicidal Thoughts:  Yes.  without intent/plan  Homicidal Thoughts:  No  Memory:  Immediate;   Good Recent;   Good Remote;   Good  Judgement:  Fair  Insight:  Fair  Psychomotor Activity:  Normal  Concentration:  Concentration: Fair and Attention Span: Fair  Recall:  Good  Fund of Knowledge:  Good  Language:  Good  Akathisia:  No  Handed:  Right  AIMS (if indicated):     Assets:  Desire for Improvement Physical Health  ADL's:  Intact  Cognition:  WNL  Sleep:  Number of Hours: 6.5     Treatment Plan Summary: Daily contact with patient to assess and evaluate symptoms and progress in treatment, Medication management and Plan to continue current regimen  1 Admit for crisis management and stabilization.  2. Medication management to reduce symptoms to baseline and improved the patient's overall level of functioning. Closely monitor the side effects, efficacy and therapeutic response of medication.  Zoloft 50mg  po daily. WIll increase tomorrow. Trazodone 100mg  po qhs prn.  3. Treat health problem as indicated. Will draw TSH, Lipid and a1c.  4. Developed treatment plan to decrease the risk of relapse upon discharge and to reduce the need for readmission.  5. Psychosocial education regarding relapse prevention in self-care.  6. Healthcare followup as needed for medical problems and called consults as indicated.  7. Increase collateral information.  8. Restart home medication where appropriate  9. Encouraged to participate and verbalize into group milieu therapy.    Suella Broad, FNP 07/27/2018, 11:18 AM

## 2018-07-27 NOTE — Progress Notes (Signed)
D.  Pt pleasant on approach, no complaints voiced.  Pt was positive for evening wrap up group, observed engaged in appropriate interaction with peers on the unit.  Pt denies SI/HI/AVH at this time but did express passive SI on dayshift.  Pt does agree to safety on the unit.  A.  Support and encouragement offered, medication given as ordered  R.  Pt remain safe on the unit, will continue to monitor.

## 2018-07-27 NOTE — B[facility] Group Notes (Signed)
[place] Group Notes: (Clinical Social Work)   07/27/2018      Type of Therapy:  Group Therapy   Participation Level:  Did Not Attend despite MHT prompting   Selmer Dominion, LCSW 07/27/2018, 12:08 PM

## 2018-07-27 NOTE — Progress Notes (Signed)
Adult Psychoeducational Group Note  Date:  07/27/2018 Time:  9:30 PM  Group Topic/Focus:  Wrap-Up Group:   The focus of this group is to help patients review their daily goal of treatment and discuss progress on daily workbooks.  Participation Level:  Active  Participation Quality:  Appropriate  Affect:  Appropriate  Cognitive:  Appropriate  Insight: Appropriate  Engagement in Group:  Engaged  Modes of Intervention:  Discussion  Additional Comments: The patient expressed that he rates today a 6.The patient also said that his goal is to be more productive. Nash Shearer 07/27/2018, 9:30 PM

## 2018-07-28 ENCOUNTER — Other Ambulatory Visit: Payer: Self-pay

## 2018-07-28 LAB — LIPID PANEL
Cholesterol: 147 mg/dL (ref 0–200)
HDL: 41 mg/dL (ref >40)
LDL CALC: 95 mg/dL (ref 0–99)
Total CHOL/HDL Ratio: 3.6 RATIO
Triglycerides: 57 mg/dL (ref <150)
VLDL: 11 mg/dL (ref 0–40)

## 2018-07-28 LAB — HEMOGLOBIN A1C
HEMOGLOBIN A1C: 5.6 % (ref 4.8–5.6)
MEAN PLASMA GLUCOSE: 114.02 mg/dL

## 2018-07-28 LAB — TSH: TSH: 1.546 u[IU]/mL (ref 0.350–4.500)

## 2018-07-28 NOTE — B[facility] Group Notes (Signed)
[place] LCSW Group Therapy Note  07/28/2018  9:00-10:00AM  Type of Therapy and Topic:  Group Therapy:  Adding Supports Including Being Your Own Support  Participation Level:  Did Not Attend   Description of Group:  Patients in this group were introduced to the concept that additional supports including self-support are an essential part of recovery.  A song entitled "I Need Help!" was played and a group discussion was held in reaction to the idea of needing to add supports.  A song entitled "My Own Hero" was played and a group discussion ensued in which patients stated they could relate to the song and it inspired them to realize they have be willing to help themselves in order to succeed, because other people cannot achieve sobriety or stability for them.  We discussed adding a variety of healthy supports to address the various needs in their lives.  A song was played called "I Know Where I've Been" toward the end of group and used to conduct an inspirational wrap-up to group of remembering how far they have already come in their journey.  Therapeutic Goals: 1)  demonstrate the importance of being a part of one's own support system 2)  discuss reasons people in one's life may eventually be unable to be continually supportive  3)  identify the patient's current support system and   4)  elicit commitments to add healthy supports and to become more conscious of being self-supportive   Summary of Patient Progress:  N/A   Therapeutic Modalities:   Motivational Interviewing Activity  Maretta Los

## 2018-07-28 NOTE — Progress Notes (Signed)
D. Pt presents with a flat affect and depressed behavior. Pt visible in the milieu interacting appropriately with peers and staff. Per pt's self inventory, pt rates his depression, hopelessness and anxiety a 4/4/5, respectively. Pt writes that his most important goal today to work on is "patience" and writes that he will "meditate" to help him meet that goal. Pt endorses passive SI with no plan and agrees to contact staff before acting on any harmful thoughts.  A. Labs and vitals monitored. Pt compliant with medications. Pt supported emotionally and encouraged to express concerns and ask questions.   R. Pt remains safe with 15 minute checks. Will continue POC.

## 2018-07-28 NOTE — Progress Notes (Signed)
Patient did attend the evening speaker AA meeting.  

## 2018-07-28 NOTE — Progress Notes (Signed)
[facility] MD Progress Note  07/28/2018 12:28 PM Gabriel Ashley  MRN:  [id number] Subjective:  "I feel hopeless because I don't know if I can beat my addiction". Principal Problem: Severe recurrent major depression without psychotic features ([employer]) Diagnosis:   Patient Active Problem List   Diagnosis Date Noted  . Cocaine dependence with cocaine-induced mood disorder ([place]) [F14.24]   . MDD (major depressive disorder), recurrent episode ([place]) [F33.9] 04/25/2017  . Acute renal failure (ARF) ([place]) [N17.9] 04/20/2017  . Hypertensive urgency [I16.0] 04/20/2017  . Acute congestive heart failure with left ventricular diastolic dysfunction ([place]) [I50.31] 12/04/2016  . Essential hypertension [I10]   . Substance abuse ([place]) [F19.10] 10/26/2014  . Substance induced mood disorder ([place]) [F19.94]   . Major depressive disorder, recurrent, severe without psychotic features ([place]) [F33.2]   . Severe major depression ([place]) [F32.2] 10/23/2014  . PTSD (post-traumatic stress disorder) [F43.10]   . Essential hypertension, benign [I10] 05/11/2014  . Elevated serum creatinine [R79.89] 05/08/2014  . Depressive disorder [F32.9] 05/07/2014  . Suicidal ideation [R45.851] 05/07/2014  . Homicidal ideation [R45.850] 05/07/2014  . Anxiety [F41.9] 05/07/2014  . Severe recurrent major depression without psychotic features ([place]) [F33.2] 05/07/2014   Total Time spent with patient: 30 minutes  Past Psychiatric History: MDD, Cocaine use Disorder, PTSD  HPI: Gabriel Ashley is a 52 y/o M with history of MDD, PTSD, and cocaine use disorder who was admitted voluntarily from [facility] [facility] where he presented with worsening depression, SI with plan, and worsening use of crack cocaine. He also reported being off of his psychotropic medications for several months. He was medically cleared and then transferred to [facility] for additional treatment and stabilization.  Upon evaluation, pt was seen in his room. Pt has significantly depressed affect, somewhat  increased latency of responses, poor eye contact, and low speech. His responses are somewhat minimal and vague at times, limiting his ability as a historian. However, he is generally cooperative with the interview. He reports mood as "fair" which he rated as a 5 on a scale of 1-10 with 10 being the worst depression although admits suicidal ideation with plans to walk into traffic if discharged today. He feels hopeless about his addiction and wonders if he should bother with treatment.  He denies homicidal ideation, auditory or visual hallucinations or paranoia. He also denied any cravings, urges or withdrawal symptoms at this time. He is awaiting placement to long-term rehabilitation for cocaine use disorder.  Past Medical History:  Past Medical History:  Diagnosis Date  . Anemia   . Anxiety   . Asthma   . Cannabis use in remisison   . Cocaine use   . ED (erectile dysfunction)   . Hypertension   . Insomnia   . Kidney disease   . OSA (obstructive sleep apnea)   . Post traumatic stress disorder (PTSD)   . Vitamin D deficiency    History reviewed. No pertinent surgical history. Family History:  Family History  Problem Relation Age of Onset  . Breast cancer Mother   . Cancer Father        type unknown   Family Psychiatric  History: See HPI Social History:  Social History   Substance and Sexual Activity  Alcohol Use Not Currently   Comment: 2-3 years ago     Social History   Substance and Sexual Activity  Drug Use Yes  . Types: Cocaine, Marijuana    Social History   Socioeconomic History  . Marital status: Single    Spouse name:  Not on file  . Number of children: 1  . Years of education: Not on file  . Highest education level: Not on file  Occupational History  . Not on file  Social Needs  . Financial resource strain: Not on file  . Food insecurity:    Worry: Not on file    Inability: Not on file  . Transportation needs:    Medical: Not on file    Non-medical: Not  on file  Tobacco Use  . Smoking status: Former Smoker    Packs/day: 1.00    Years: 31.00    Pack years: 31.00    Last attempt to quit: 11/03/2017    Years since quitting: 0.7  . Smokeless tobacco: Never Used  Substance and Sexual Activity  . Alcohol use: Not Currently    Comment: 2-3 years ago  . Drug use: Yes    Types: Cocaine, Marijuana  . Sexual activity: Yes  Lifestyle  . Physical activity:    Days per week: Not on file    Minutes per session: Not on file  . Stress: Not on file  Relationships  . Social connections:    Talks on phone: Not on file    Gets together: Not on file    Attends religious service: Not on file    Active member of club or organization: Not on file    Attends meetings of clubs or organizations: Not on file    Relationship status: Not on file  Other Topics Concern  . Not on file  Social History Narrative  . Not on file   Additional Social History:    History of alcohol / drug use?: Yes                    Sleep: Good  Appetite:  Good  Current Medications: Current Facility-Administered Medications  Medication Dose Route Frequency Provider Last Rate Last Dose  . albuterol (PROVENTIL HFA;VENTOLIN HFA) 108 (90 Base) MCG/ACT inhaler 2 puff  2 puff Inhalation Q6H PRN Rankin, Shuvon B, NP      . amLODipine (NORVASC) tablet 10 mg  10 mg Oral Daily Rankin, Shuvon B, NP   10 mg at 07/28/18 0754  . carvedilol (COREG) tablet 12.5 mg  12.5 mg Oral Daily Rankin, Shuvon B, NP   12.5 mg at 07/28/18 0754  . cloNIDine (CATAPRES) tablet 0.2 mg  0.2 mg Oral BID Rankin, Shuvon B, NP   0.2 mg at 07/28/18 0754  . furosemide (LASIX) tablet 40 mg  40 mg Oral Daily Rankin, Shuvon B, NP   40 mg at 07/28/18 0754  . hydrOXYzine (ATARAX/VISTARIL) tablet 50 mg  50 mg Oral Q8H PRN Pennelope Bracken, MD   50 mg at 07/27/18 2131  . multivitamin with minerals tablet 1 tablet  1 tablet Oral Daily Rankin, Shuvon B, NP   1 tablet at 07/28/18 0754  . sertraline  (ZOLOFT) tablet 100 mg  100 mg Oral Daily Suella Broad, FNP   100 mg at 07/28/18 0754  . traZODone (DESYREL) tablet 100 mg  100 mg Oral QHS PRN Rankin, Shuvon B, NP   100 mg at 07/27/18 2130    Lab Results:  Results for orders placed or performed during the hospital encounter of 07/25/18 (from the past 48 hour(s))  TSH     Status: None   Collection Time: 07/28/18  6:34 AM  Result Value Ref Range   TSH 1.546 0.350 - 4.500 uIU/mL    Comment:  Performed by a 3rd Generation assay with a functional sensitivity of <=0.01 uIU/mL. Performed at [facility], [place] [address] [place], [place] [address]   Lipid panel     Status: None   Collection Time: 07/28/18  6:34 AM  Result Value Ref Range   Cholesterol 147 0 - 200 mg/dL   Triglycerides 57 <150 mg/dL   HDL 41 >40 mg/dL   Total CHOL/HDL Ratio 3.6 RATIO   VLDL 11 0 - 40 mg/dL   LDL Cholesterol 95 0 - 99 mg/dL    Comment:        Total Cholesterol/HDL:CHD Risk Coronary Heart Disease Risk Table                     Men   Women  1/2 Average Risk   3.4   3.3  Average Risk       5.0   4.4  2 X Average Risk   9.6   7.1  3 X Average Risk  23.4   11.0        Use the calculated Patient Ratio above and the CHD Risk Table to determine the patient's CHD Risk.        ATP III CLASSIFICATION (LDL):  <100     mg/dL   Optimal  100-129  mg/dL   Near or Above                    Optimal  130-159  mg/dL   Borderline  160-189  mg/dL   High  >190     mg/dL   Very High Performed at [facility] [address], [place], [place] [address]   Hemoglobin A1c     Status: None   Collection Time: 07/28/18  6:34 AM  Result Value Ref Range   Hgb A1c MFr Bld 5.6 4.8 - 5.6 %    Comment: (NOTE) Pre diabetes:          5.7%-6.4% Diabetes:              >6.4% Glycemic control for   <7.0% adults with diabetes    Mean Plasma Glucose 114.02 mg/dL    Comment: Performed at [facility] [address],  [place], [place] [address]    Blood Alcohol level:  Lab Results  Component Value Date   [facility] <10 07/23/2018   ETH <5 91/47/8295    Metabolic Disorder Labs: Lab Results  Component Value Date   HGBA1C 5.6 07/28/2018   MPG 114.02 07/28/2018   No results found for: PROLACTIN Lab Results  Component Value Date   CHOL 147 07/28/2018   TRIG 57 07/28/2018   HDL 41 07/28/2018   CHOLHDL 3.6 07/28/2018   VLDL 11 07/28/2018   LDLCALC 95 07/28/2018    Physical Findings: AIMS: Facial and Oral Movements Muscles of Facial Expression: None, normal Lips and Perioral Area: None, normal Jaw: None, normal Tongue: None, normal,Extremity Movements Upper (arms, wrists, hands, fingers): None, normal Lower (legs, knees, ankles, toes): None, normal, Trunk Movements Neck, shoulders, hips: None, normal, Overall Severity Severity of abnormal movements (highest score from questions above): None, normal Incapacitation due to abnormal movements: None, normal Patient's awareness of abnormal movements (rate only patient's report): No Awareness, Dental Status Current problems with teeth and/or dentures?: No Does patient usually wear dentures?: No  CIWA:    COWS:     Musculoskeletal: Strength & Muscle Tone: within normal limits Gait & Station: normal Patient  leans: N/A  Psychiatric Specialty Exam: Physical Exam  Constitutional: He is oriented to person, place, and time. He appears well-developed.  Neurological: He is alert and oriented to person, place, and time.  Psychiatric: His speech is normal. Thought content normal. His affect is blunt. He is withdrawn. He expresses impulsivity. He exhibits a depressed mood. He exhibits abnormal remote memory. He is inattentive.    Review of Systems  Constitutional: Negative.   Neurological: Negative for dizziness, tingling and headaches.  Psychiatric/Behavioral: Positive for depression and substance abuse. Negative for hallucinations. The patient is not  nervous/anxious.     Blood pressure (!) 163/104, pulse 79, temperature 98.7 F (37.1 C), temperature source Oral, resp. rate 18, height 5\' 11"  (1.803 m), weight 86.2 kg, SpO2 100 %.Body mass index is 26.5 kg/m.  General Appearance: Fairly Groomed  Eye Contact:  Poor  Speech:  Slow  Volume:  Decreased  Mood:  Depressed  Affect:  Appropriate  Thought Process:  Goal Directed  Orientation:  Full (Time, Place, and Person)  Thought Content:  Logical  Suicidal Thoughts:  Yes.  without intent/plan  Homicidal Thoughts:  No  Memory:  Immediate;   Fair Recent;   Fair Remote;   Fair  Judgement:  Fair  Insight:  Fair  Psychomotor Activity:  Normal  Concentration:  Concentration: Fair and Attention Span: Fair  Recall:  Good  Fund of Knowledge:  Good  Language:  Good  Akathisia:  No  Handed:  Right  AIMS (if indicated):     Assets:  Desire for Improvement Physical Health  ADL's:  Intact  Cognition:  WNL  Sleep:  Number of Hours: 6     Treatment Plan Summary: Daily contact with patient to assess and evaluate symptoms and progress in treatment, Medication management and Plan to continue current regimen  1 Admit for crisis management and stabilization.  2. Medication management to reduce symptoms to baseline and improved the patient's overall level of functioning. Closely monitor the side effects, efficacy and therapeutic response of medication.  Increase Zoloft to 100mg  QD. Trazodone 100mg  po qhs prn.  3. Treat health problem as indicated. TSH-1.546.  4. Developed treatment plan to decrease the risk of relapse upon discharge and to reduce the need for readmission.  5. Psychosocial education regarding relapse prevention in self-care.  6. Healthcare followup as needed for medical problems and called consults as indicated.  7. Increase collateral information.  8. Restart home medication where appropriate  9. Encouraged to participate and verbalize into group milieu therapy.    Suella Broad, FNP 07/28/2018, 12:28 PM

## 2018-07-28 NOTE — BHH Group Notes (Signed)
[facility] Group Notes:  (Nursing)  Date:  07/28/2018  Time:130 PM Type of Therapy:  Nurse Education  Participation Level:  Active  Participation Quality:  Appropriate and Attentive  Affect:  Appropriate  Cognitive:  Alert and Appropriate  Insight:  Appropriate and Good  Engagement in Group:  Engaged  Modes of Intervention:  Discussion and Education  Summary of Progress/Problems: Nurse led psycho educational group- sleep hygiene  Waymond Cera 07/28/2018, 3:06 PM

## 2018-07-28 NOTE — Progress Notes (Signed)
D.  Pt pleasant on approach, no complaints voiced.  Pt was positive for evening AA group, observed engaged in appropriate interaction with peers on the unit.  Pt does endorse passive SI but agrees to safety on the unit, no HI/AVH at this time.  A.  Support and encouragement offered, medication given as ordered  R.  Pt remains safe on the unit, will continue to monitor.

## 2018-07-29 DIAGNOSIS — F332 Major depressive disorder, recurrent severe without psychotic features: Principal | ICD-10-CM

## 2018-07-29 NOTE — Progress Notes (Signed)
Recreation Therapy Notes  Date: 11.11.19 Time: 0930 Location: [address] Dayroom  Group Topic: Stress Management  Goal Area(s) Addresses:  Patient will verbalize importance of using healthy stress management.  Patient will identify positive emotions associated with healthy stress management.   Intervention: Stress Management  Activity :  Progressive Muscle Relaxation.  LRT introduced the stress management technique of progressive muscle relaxation.  LRT read a script to lead patients through a series of exercises that allowed them to tense and then relax each muscle group individually.    Education:  Stress Management, Discharge Planning.   Education Outcome: Acknowledges edcuation/In group clarification offered/Needs additional education  Clinical Observations/Feedback: Pt did not attend group.    Victorino Sparrow, LRT/CTRS         Ria Comment, Talullah Abate A 07/29/2018 11:10 AM

## 2018-07-29 NOTE — Progress Notes (Signed)
[facility] MD Progress Note  07/29/2018 10:54 AM Kymoni Monday  MRN:  [id number]  Subjective: Nethaniel reports, "I feel pretty depressed today. It is all related to my drug use. I don't know how to stop. I just started the medicines. I have not felt any relief yet because they have not kicked in. I will really need to go to a rehab facility that is 30 days or more, like the ones in [place] or [place]".   Per HPI: Justis Closser is a 52 y/o M with history of MDD, PTSD, and cocaine use disorder who was admitted voluntarily from [facility] where he presented with worsening depression, SI with plan, and worsening use of crack cocaine. He also reported being off of his psychotropic medications for several months. He was medically cleared and then transferred to [facility] for additional treatment and stabilization.  Upon evaluation, pt is seen in his room. He presents with a  significant depressed affect, somewhat increased latency of responses, fair eye contact with low speech. His responses are somewhat minimal and vague at times. However, he is generally cooperative with this interview. He reports still "feeling very depressed"  which he rates #8 & anxiety at #5 on the scale of (1-10). He continues to endorse passive suicidal ideations without any plans or intent to hurt himself. He is able to verbally contract for safety on the unit. He currently denies any homicidal ideation, auditory/visual hallucinations, delusions or paranoia. Patient verbalizes his interest interested in going to a residential treatment centers in [facility] or [place] because they last 30 days or more.  He is currently awaiting placement. Khyre has agreed to continue current plan of care as already in progress. He is attending some group sessions.  He does not appear to be responding to any internal stimuli.  Principal Problem: Severe recurrent major depression without psychotic features ([place])  Diagnosis:   Patient Active Problem List   Diagnosis Date Noted  . Cocaine dependence with cocaine-induced mood disorder ([place]) [F14.24]   . MDD (major depressive disorder), recurrent episode ([place]) [F33.9] 04/25/2017  . Acute renal failure (ARF) ([place]) [N17.9] 04/20/2017  . Hypertensive urgency [I16.0] 04/20/2017  . Acute congestive heart failure with left ventricular diastolic dysfunction ([place]) [I50.31] 12/04/2016  . Essential hypertension [I10]   . Substance abuse ([place]) [F19.10] 10/26/2014  . Substance induced mood disorder ([place]) [F19.94]   . Major depressive disorder, recurrent, severe without psychotic features ([place]) [F33.2]   . Severe major depression ([place]) [F32.2] 10/23/2014  . PTSD (post-traumatic stress disorder) [F43.10]   . Essential hypertension, benign [I10] 05/11/2014  . Elevated serum creatinine [R79.89] 05/08/2014  . Depressive disorder [F32.9] 05/07/2014  . Suicidal ideation [R45.851] 05/07/2014  . Homicidal ideation [R45.850] 05/07/2014  . Anxiety [F41.9] 05/07/2014  . Severe recurrent major depression without psychotic features ([place]) [F33.2] 05/07/2014   Total Time spent with patient: 15 minutes  Past Psychiatric History: See H&P  Past Medical History:  Past Medical History:  Diagnosis Date  . Anemia   . Anxiety   . Asthma   . Cannabis use in remisison   . Cocaine use   . ED (erectile dysfunction)   . Hypertension   . Insomnia   . Kidney disease   . OSA (obstructive sleep apnea)   . Post traumatic stress disorder (PTSD)   . Vitamin D deficiency    History reviewed. No pertinent surgical history.  Family History:  Family History  Problem Relation Age of Onset  . Breast cancer Mother   .  Cancer Father        type unknown   Family Psychiatric  History: See H&P  Social History:  Social History   Substance and Sexual Activity  Alcohol Use Not Currently   Comment: 2-3 years ago     Social History   Substance and Sexual Activity  Drug Use Yes  . Types: Cocaine, Marijuana    Social  History   Socioeconomic History  . Marital status: Single    Spouse name: Not on file  . Number of children: 1  . Years of education: Not on file  . Highest education level: Not on file  Occupational History  . Not on file  Social Needs  . Financial resource strain: Not on file  . Food insecurity:    Worry: Not on file    Inability: Not on file  . Transportation needs:    Medical: Not on file    Non-medical: Not on file  Tobacco Use  . Smoking status: Former Smoker    Packs/day: 1.00    Years: 31.00    Pack years: 31.00    Last attempt to quit: 11/03/2017    Years since quitting: 0.7  . Smokeless tobacco: Never Used  Substance and Sexual Activity  . Alcohol use: Not Currently    Comment: 2-3 years ago  . Drug use: Yes    Types: Cocaine, Marijuana  . Sexual activity: Yes  Lifestyle  . Physical activity:    Days per week: Not on file    Minutes per session: Not on file  . Stress: Not on file  Relationships  . Social connections:    Talks on phone: Not on file    Gets together: Not on file    Attends religious service: Not on file    Active member of club or organization: Not on file    Attends meetings of clubs or organizations: Not on file    Relationship status: Not on file  Other Topics Concern  . Not on file  Social History Narrative  . Not on file   Additional Social History:   History of alcohol / drug use?: Yes  Sleep: Good  Appetite:  Good  Current Medications: Current Facility-Administered Medications  Medication Dose Route Frequency Provider Last Rate Last Dose  . albuterol (PROVENTIL HFA;VENTOLIN HFA) 108 (90 Base) MCG/ACT inhaler 2 puff  2 puff Inhalation Q6H PRN Rankin, Shuvon B, NP      . amLODipine (NORVASC) tablet 10 mg  10 mg Oral Daily Rankin, Shuvon B, NP   10 mg at 07/29/18 0931  . carvedilol (COREG) tablet 12.5 mg  12.5 mg Oral Daily Rankin, Shuvon B, NP   12.5 mg at 07/29/18 0932  . cloNIDine (CATAPRES) tablet 0.2 mg  0.2 mg Oral BID  Rankin, Shuvon B, NP   0.2 mg at 07/29/18 0931  . furosemide (LASIX) tablet 40 mg  40 mg Oral Daily Rankin, Shuvon B, NP   40 mg at 07/29/18 0932  . hydrOXYzine (ATARAX/VISTARIL) tablet 50 mg  50 mg Oral Q8H PRN Pennelope Bracken, MD   50 mg at 07/28/18 2200  . multivitamin with minerals tablet 1 tablet  1 tablet Oral Daily Rankin, Shuvon B, NP   1 tablet at 07/29/18 0932  . sertraline (ZOLOFT) tablet 100 mg  100 mg Oral Daily Suella Broad, FNP   100 mg at 07/29/18 0932  . traZODone (DESYREL) tablet 100 mg  100 mg Oral QHS PRN Rankin, Shuvon B, NP  100 mg at 07/28/18 2200   Lab Results:  Results for orders placed or performed during the hospital encounter of 07/25/18 (from the past 48 hour(s))  TSH     Status: None   Collection Time: 07/28/18  6:34 AM  Result Value Ref Range   TSH 1.546 0.350 - 4.500 uIU/mL    Comment: Performed by a 3rd Generation assay with a functional sensitivity of <=0.01 uIU/mL. Performed at [facility], [place] [address], [place], [place] [address]   Lipid panel     Status: None   Collection Time: 07/28/18  6:34 AM  Result Value Ref Range   Cholesterol 147 0 - 200 mg/dL   Triglycerides 57 <150 mg/dL   HDL 41 >40 mg/dL   Total CHOL/HDL Ratio 3.6 RATIO   VLDL 11 0 - 40 mg/dL   LDL Cholesterol 95 0 - 99 mg/dL    Comment:        Total Cholesterol/HDL:CHD Risk Coronary Heart Disease Risk Table                     Men   Women  1/2 Average Risk   3.4   3.3  Average Risk       5.0   4.4  2 X Average Risk   9.6   7.1  3 X Average Risk  23.4   11.0        Use the calculated Patient Ratio above and the CHD Risk Table to determine the patient's CHD Risk.        ATP III CLASSIFICATION (LDL):  <100     mg/dL   Optimal  100-129  mg/dL   Near or Above                    Optimal  130-159  mg/dL   Borderline  160-189  mg/dL   High  >190     mg/dL   Very High Performed at [facility] [address]  [place], [place] [address]   Hemoglobin A1c     Status: None   Collection Time: 07/28/18  6:34 AM  Result Value Ref Range   Hgb A1c MFr Bld 5.6 4.8 - 5.6 %    Comment: (NOTE) Pre diabetes:          5.7%-6.4% Diabetes:              >6.4% Glycemic control for   <7.0% adults with diabetes    Mean Plasma Glucose 114.02 mg/dL    Comment: Performed at [facility] [address], [place], [place] [address]   Blood Alcohol level:  Lab Results  Component Value Date   [facility] <10 07/23/2018   ETH <5 41/96/2229    Metabolic Disorder Labs: Lab Results  Component Value Date   HGBA1C 5.6 07/28/2018   MPG 114.02 07/28/2018   No results found for: PROLACTIN Lab Results  Component Value Date   CHOL 147 07/28/2018   TRIG 57 07/28/2018   HDL 41 07/28/2018   CHOLHDL 3.6 07/28/2018   VLDL 11 07/28/2018   LDLCALC 95 07/28/2018   Physical Findings: AIMS: Facial and Oral Movements Muscles of Facial Expression: None, normal Lips and Perioral Area: None, normal Jaw: None, normal Tongue: None, normal,Extremity Movements Upper (arms, wrists, hands, fingers): None, normal Lower (legs, knees, ankles, toes): None, normal, Trunk Movements Neck, shoulders, hips: None, normal, Overall Severity Severity of abnormal movements (highest score from questions above):  None, normal Incapacitation due to abnormal movements: None, normal Patient's awareness of abnormal movements (rate only patient's report): No Awareness, Dental Status Current problems with teeth and/or dentures?: No Does patient usually wear dentures?: No  CIWA:    COWS:     Musculoskeletal: Strength & Muscle Tone: within normal limits Gait & Station: normal Patient leans: N/A  Psychiatric Specialty Exam: Physical Exam  Nursing note and vitals reviewed. Constitutional: He is oriented to person, place, and time. He appears well-developed.  Neurological: He is alert and oriented to person, place, and time.  Psychiatric: His speech  is normal. Thought content normal. His affect is blunt. He is withdrawn. He expresses impulsivity. He exhibits a depressed mood. He exhibits abnormal remote memory. He is inattentive.    Review of Systems  Constitutional: Negative.   Respiratory: Negative.  Negative for cough and shortness of breath.   Cardiovascular: Negative for chest pain, palpitations and leg swelling.       Hx. HTN  Gastrointestinal: Negative.  Negative for abdominal pain, heartburn, nausea and vomiting.  Neurological: Negative for dizziness, tingling and headaches.  Psychiatric/Behavioral: Positive for depression and substance abuse. Negative for hallucinations. The patient is not nervous/anxious.     Blood pressure (!) 158/92, pulse 66, temperature 97.7 F (36.5 C), temperature source Oral, resp. rate 20, height 5\' 11"  (1.803 m), weight 86.2 kg, SpO2 100 %.Body mass index is 26.5 kg/m.  General Appearance: Fairly Groomed  Eye Contact:  Poor  Speech:  Slow  Volume:  Decreased  Mood:  Depressed  Affect:  Appropriate  Thought Process:  Goal Directed  Orientation:  Full (Time, Place, and Person)  Thought Content:  Logical  Suicidal Thoughts:  Yes.  without intent/plan  Homicidal Thoughts:  No  Memory:  Immediate;   Fair Recent;   Fair Remote;   Fair  Judgement:  Fair  Insight:  Fair  Psychomotor Activity:  Normal  Concentration:  Concentration: Fair and Attention Span: Fair  Recall:  Good  Fund of Knowledge:  Good  Language:  Good  Akathisia:  No  Handed:  Right  AIMS (if indicated):     Assets:  Desire for Improvement Physical Health  ADL's:  Intact  Cognition:  WNL  Sleep:  Number of Hours: 6   Treatment Plan Summary: Daily contact with patient to assess and evaluate symptoms and progress in treatment, Medication management and Plan to continue current regimen.  - Continue inpatient hospitalization.  - Will continue today 07/29/2018 plan as below except where it is noted.  Depression.     -  Continue Sertraline 100 mg po Q day.       - DC'ed Prozac.   Anxiety     - Continue Vistaril 50mg  po Q 6 hours prn anxiety.   DC'ed buspar.   Anxiety     - Continue trazodone 100 mg po Q hs.   Other medical issues.     - Continue albuterol inhaler 2 puffs Q 6 hours prn For Asthma.     - Continue amlodipine 10 mg po Q daily for HTN.     - Continue Clonidine 0.2 mg po bid for HTN.     - Continue carvedilol 12.5 mg po Q daily for HTN.     - Continue Lasix 40 mg po Q daily for edema/HTN.  Continue all other current orders/ PRN's without changes - see MAR.  - Patient to attend & participate in the group counseling sessions.  - Discharge disposition plan is  ongoing.  Lindell Spar, NP, PMHNP, FNP_BC 07/29/2018, 10:54 AMPatient ID: [name], male   DOB: 26-Sep-1965, 52 y.o.   MRN: [id number]

## 2018-07-29 NOTE — Progress Notes (Signed)
Patient ID: Gabriel Ashley, male   DOB: [birth year], 52 y.o.   MRN: [id number]  D. Pt presents with a flat affect and depressed behavior. Pt currently denies HI and AVH. Pt endorses passive SI, no plan while at [facility] or at home. Pt agrees to contact staff before acting on any harmful thoughts.   A. Labs and vitals monitored. Pt given and educated on medications. Pt supported emotionally and encouraged to express concerns and ask questions.    R. Pt remains safe with 15 minute checks. Pt attends morning group, limited interactions with others. Will continue POC.

## 2018-07-29 NOTE — [facility] Group Notes (Signed)
Pt attended spiritual care group on grief and loss facilitated by chaplain Jerene Pitch   Group goal of establishing open and affirming space for members to engage grief, normalize and support grief experience and provide psycho social education and grief support. Group opened with brief discussion and psycho-social ed around grief and loss in relationships and in relation to self - identifying life patterns, circumstances, changes connected to grief response. Group participated in facilitated process group around topic of grief.

## 2018-07-29 NOTE — [facility] Group Notes (Signed)
Pt was invited but did not attend orientation group facilitated by [facility] Leo P.

## 2018-07-30 NOTE — [facility] Group Notes (Signed)
[facility] Group Therapy 07/30/2018 1:15pm  Type of Therapy: Mental [employer] Presentation  Participation Level: Pt invited. Chose to remain in bed. DID NOT ATTEND>    Avelina Laine, LCSW 07/30/2018 2:13 PM

## 2018-07-30 NOTE — Progress Notes (Signed)
[facility] MD Progress Note  07/30/2018 10:45 AM Gabriel Ashley  MRN:  [id number]  Subjective: Gabriel Ashley reports, "I'm still feeling very depressed. That is the reason I'm still lying down in bed. I'm not doing too good because I'm having suicidal thoughts. I have not attended any group milieu today. I will really need to go to a rehab facility that is 30 days or more, like the ones in [place] or [place]. I heard these places are really good".   Per HPI: Gabriel Ashley is a 51 y/o M with history of MDD, PTSD, and cocaine use disorder who was admitted voluntarily from [facility] where he presented with worsening depression, SI with plan, and worsening use of crack cocaine. He also reported being off of his psychotropic medications for several months. He was medically cleared and then transferred to [facility] for additional treatment and stabilization.  Upon evaluation, Gabriel Ashley is in his room, lying down in bed. He presents with a  significant depressed affect, responding to the assessment questions in a very low tone, making fair eye contact. He presents & reports today as similar as he was yesterday . However, he is generally cooperative with this interview.l He rates his depression at #8 & anxiety at #6 on the scale of (1-10). He continues to endorse passive suicidal ideations without any plans or intent to hurt himself. He is able to verbally contract for safety on the unit. He currently denies any homicidal ideation, auditory/visual hallucinations, delusions or paranoia. Patient verbalizes his interest interested in going to a residential treatment centers in [facility] or [place] because they last 30 days or more.  He is currently awaiting placement. Gabriel Ashley has agreed to continue current plan of care as already in progress. He is not attending group sessions today, but encouraged to get out of bed & participate in the group sessions.  He does not appear to be responding to any internal stimuli. No changes made on his  current plan of care.  Principal Problem: Severe recurrent major depression without psychotic features ([place])  Diagnosis:   Patient Active Problem List   Diagnosis Date Noted  . Cocaine dependence with cocaine-induced mood disorder ([place]) [F14.24]   . MDD (major depressive disorder), recurrent episode ([place]) [F33.9] 04/25/2017  . Acute renal failure (ARF) ([place]) [N17.9] 04/20/2017  . Hypertensive urgency [I16.0] 04/20/2017  . Acute congestive heart failure with left ventricular diastolic dysfunction ([place]) [I50.31] 12/04/2016  . Essential hypertension [I10]   . Substance abuse ([place]) [F19.10] 10/26/2014  . Substance induced mood disorder ([place]) [F19.94]   . Major depressive disorder, recurrent, severe without psychotic features ([place]) [F33.2]   . Severe major depression ([place]) [F32.2] 10/23/2014  . PTSD (post-traumatic stress disorder) [F43.10]   . Essential hypertension, benign [I10] 05/11/2014  . Elevated serum creatinine [R79.89] 05/08/2014  . Depressive disorder [F32.9] 05/07/2014  . Suicidal ideation [R45.851] 05/07/2014  . Homicidal ideation [R45.850] 05/07/2014  . Anxiety [F41.9] 05/07/2014  . Severe recurrent major depression without psychotic features ([place]) [F33.2] 05/07/2014   Total Time spent with patient: 15 minutes  Past Psychiatric History: See H&P  Past Medical History:  Past Medical History:  Diagnosis Date  . Anemia   . Anxiety   . Asthma   . Cannabis use in remisison   . Cocaine use   . ED (erectile dysfunction)   . Hypertension   . Insomnia   . Kidney disease   . OSA (obstructive sleep apnea)   . Post traumatic stress disorder (PTSD)   .  Vitamin D deficiency    History reviewed. No pertinent surgical history.  Family History:  Family History  Problem Relation Age of Onset  . Breast cancer Mother   . Cancer Father        type unknown   Family Psychiatric  History: See H&P  Social History:  Social History   Substance and Sexual Activity  Alcohol Use  Not Currently   Comment: 2-3 years ago     Social History   Substance and Sexual Activity  Drug Use Yes  . Types: Cocaine, Marijuana    Social History   Socioeconomic History  . Marital status: Single    Spouse name: Not on file  . Number of children: 1  . Years of education: Not on file  . Highest education level: Not on file  Occupational History  . Not on file  Social Needs  . Financial resource strain: Not on file  . Food insecurity:    Worry: Not on file    Inability: Not on file  . Transportation needs:    Medical: Not on file    Non-medical: Not on file  Tobacco Use  . Smoking status: Former Smoker    Packs/day: 1.00    Years: 31.00    Pack years: 31.00    Last attempt to quit: 11/03/2017    Years since quitting: 0.7  . Smokeless tobacco: Never Used  Substance and Sexual Activity  . Alcohol use: Not Currently    Comment: 2-3 years ago  . Drug use: Yes    Types: Cocaine, Marijuana  . Sexual activity: Yes  Lifestyle  . Physical activity:    Days per week: Not on file    Minutes per session: Not on file  . Stress: Not on file  Relationships  . Social connections:    Talks on phone: Not on file    Gets together: Not on file    Attends religious service: Not on file    Active member of club or organization: Not on file    Attends meetings of clubs or organizations: Not on file    Relationship status: Not on file  Other Topics Concern  . Not on file  Social History Narrative  . Not on file   Additional Social History:   History of alcohol / drug use?: Yes  Sleep: Good  Appetite:  Good  Current Medications: Current Facility-Administered Medications  Medication Dose Route Frequency Provider Last Rate Last Dose  . albuterol (PROVENTIL HFA;VENTOLIN HFA) 108 (90 Base) MCG/ACT inhaler 2 puff  2 puff Inhalation Q6H PRN Rankin, Shuvon B, NP      . amLODipine (NORVASC) tablet 10 mg  10 mg Oral Daily Rankin, Shuvon B, NP   10 mg at 07/30/18 0756  .  carvedilol (COREG) tablet 12.5 mg  12.5 mg Oral Daily Rankin, Shuvon B, NP   12.5 mg at 07/30/18 0757  . cloNIDine (CATAPRES) tablet 0.2 mg  0.2 mg Oral BID Rankin, Shuvon B, NP   0.2 mg at 07/30/18 0756  . furosemide (LASIX) tablet 40 mg  40 mg Oral Daily Rankin, Shuvon B, NP   40 mg at 07/30/18 0757  . hydrOXYzine (ATARAX/VISTARIL) tablet 50 mg  50 mg Oral Q8H PRN Pennelope Bracken, MD   50 mg at 07/29/18 2150  . multivitamin with minerals tablet 1 tablet  1 tablet Oral Daily Rankin, Shuvon B, NP   1 tablet at 07/30/18 0756  . sertraline (ZOLOFT) tablet 100 mg  100 mg Oral Daily Suella Broad, FNP   100 mg at 07/30/18 0756  . traZODone (DESYREL) tablet 100 mg  100 mg Oral QHS PRN Rankin, Shuvon B, NP   100 mg at 07/29/18 2151   Lab Results:  No results found for this or any previous visit (from the past 48 hour(s)). Blood Alcohol level:  Lab Results  Component Value Date   ETH <10 07/23/2018   ETH <5 67/89/3810   Metabolic Disorder Labs: Lab Results  Component Value Date   HGBA1C 5.6 07/28/2018   MPG 114.02 07/28/2018   No results found for: PROLACTIN Lab Results  Component Value Date   CHOL 147 07/28/2018   TRIG 57 07/28/2018   HDL 41 07/28/2018   CHOLHDL 3.6 07/28/2018   VLDL 11 07/28/2018   LDLCALC 95 07/28/2018   Physical Findings: AIMS: Facial and Oral Movements Muscles of Facial Expression: None, normal Lips and Perioral Area: None, normal Jaw: None, normal Tongue: None, normal,Extremity Movements Upper (arms, wrists, hands, fingers): None, normal Lower (legs, knees, ankles, toes): None, normal, Trunk Movements Neck, shoulders, hips: None, normal, Overall Severity Severity of abnormal movements (highest score from questions above): None, normal Incapacitation due to abnormal movements: None, normal Patient's awareness of abnormal movements (rate only patient's report): No Awareness, Dental Status Current problems with teeth and/or dentures?:  No Does patient usually wear dentures?: No  CIWA:    COWS:     Musculoskeletal: Strength & Muscle Tone: within normal limits Gait & Station: normal Patient leans: N/A  Psychiatric Specialty Exam: Physical Exam  Nursing note and vitals reviewed. Constitutional: He is oriented to person, place, and time. He appears well-developed.  Neurological: He is alert and oriented to person, place, and time.  Psychiatric: His speech is normal. Thought content normal. His affect is blunt. He is withdrawn. He expresses impulsivity. He exhibits a depressed mood. He exhibits abnormal remote memory. He is inattentive.    Review of Systems  Constitutional: Negative.   Respiratory: Negative.  Negative for cough and shortness of breath.   Cardiovascular: Negative for chest pain, palpitations and leg swelling.       Hx. HTN  Gastrointestinal: Negative.  Negative for abdominal pain, heartburn, nausea and vomiting.  Neurological: Negative for dizziness, tingling and headaches.  Psychiatric/Behavioral: Positive for depression and substance abuse. Negative for hallucinations. The patient is not nervous/anxious.     Blood pressure (!) 149/103, pulse 82, temperature 97.7 F (36.5 C), temperature source Oral, resp. rate 20, height 5\' 11"  (1.803 m), weight 86.2 kg, SpO2 100 %.Body mass index is 26.5 kg/m.  General Appearance: Fairly Groomed  Eye Contact:  Poor  Speech:  Slow  Volume:  Decreased  Mood:  Depressed  Affect:  Appropriate  Thought Process:  Goal Directed  Orientation:  Full (Time, Place, and Person)  Thought Content:  Logical  Suicidal Thoughts:  Yes.  without intent/plan  Homicidal Thoughts:  No  Memory:  Immediate;   Fair Recent;   Fair Remote;   Fair  Judgement:  Fair  Insight:  Fair  Psychomotor Activity:  Normal  Concentration:  Concentration: Fair and Attention Span: Fair  Recall:  Good  Fund of Knowledge:  Good  Language:  Good  Akathisia:  No  Handed:  Right  AIMS (if  indicated):     Assets:  Desire for Improvement Physical Health  ADL's:  Intact  Cognition:  WNL  Sleep:  Number of Hours: 4.5   Treatment Plan Summary: Daily contact with  patient to assess and evaluate symptoms and progress in treatment, Medication management and Plan to continue current regimen.  - Continue inpatient hospitalization.  - Will continue today 07/30/2018 plan as below except where it is noted.  Depression.     - Continue Sertraline 100 mg po Q day.       - DC'ed Prozac.   Anxiety     - Continue Vistaril 50mg  po Q 6 hours prn anxiety.   DC'ed buspar.   Anxiety     - Continue trazodone 100 mg po Q hs.   Other medical issues.     - Continue albuterol inhaler 2 puffs Q 6 hours prn For Asthma.     - Continue amlodipine 10 mg po Q daily for HTN.     - Continue Clonidine 0.2 mg po bid for HTN.     - Continue carvedilol 12.5 mg po Q daily for HTN.     - Continue Lasix 40 mg po Q daily for edema/HTN.  Continue all other current orders/ PRN's without changes - see MAR.  - Patient to attend & participate in the group counseling sessions.  - Discharge disposition plan is ongoing.  Lindell Spar, NP, PMHNP, FNP_BC 07/30/2018, 10:45 AMPatient ID: [name], male   DOB: 11-02-1965, 53 y.o.   MRN: [id number]

## 2018-07-30 NOTE — Progress Notes (Signed)
Nursing note 7p-7a  Pt observed interacting with peers on unit this shift. Displayed a flat affect and irritable mood upon interaction with this Probation officer. Pt denies pain ,denies SI/HI, and also denies any audio or visual hallucinations at this time. Pt complains of anxiety and insomnia. See MAR for prn medication administration. Pt is able to verbally contract for safety with this RN. Goal: "to be conscious all day" Pt educated on falls and encouraged to wear nonskid socks when ambulating in the halls. Pt also encouraged to call for help if feeling weak or dizzy. Pt verbalized understanding of all education provided. Pt is now resting in bed with eyes closed, with no signs or symptoms of pain or distress noted. Pt continues to remain safe on the unit and is observed by rounding every 15 min. RN will continue to monitor.

## 2018-07-30 NOTE — Progress Notes (Signed)
Recreation Therapy Notes  Animal-Assisted Activity (AAA) Program Checklist/Progress Notes Patient Eligibility Criteria Checklist & Daily Group note for Rec Tx Intervention  Date: 11.12.19 Time: 29 Location: [address]   AAA/T Program Assumption of Risk Form signed by Teacher, music or Parent Legal Guardian YES   Patient is free of allergies or sever asthma  YES   Patient reports no fear of animals  YES  Patient reports no history of cruelty to animals YES   Patient understands his/her participation is voluntary YES      Patient washes hands before animal contact  YES   Patient washes hands after animal contact YES   Education: Contractor, Appropriate Animal Interaction   Education Outcome: Acknowledges understanding/In group clarification offered/Needs additional education.   Clinical Observations/Feedback: Pt did not attend activity.     Victorino Sparrow, LRT/CTRS         Victorino Sparrow A 07/30/2018 3:25 PM

## 2018-07-30 NOTE — Plan of Care (Addendum)
Patient has been calm and appropriate today. Patient still endorses passive SI, but contracts for safety while in the hospital.  Patient is compliant with medications prescribed. No side effects noted. Safety is maintained with 15 minute checks as well as environmental checks. Will continue to monitor.  Problem: Education: Goal: Mental status will improve Outcome: Progressing Goal: Verbalization of understanding the information provided will improve Outcome: Progressing   Problem: Activity: Goal: Interest or engagement in activities will improve Outcome: Progressing   Problem: Coping: Goal: Ability to verbalize frustrations and anger appropriately will improve Outcome: Progressing Goal: Ability to demonstrate self-control will improve Outcome: Progressing

## 2018-07-30 NOTE — Progress Notes (Signed)
The patient attended the evening A.A.meeting and was appropriate.  

## 2018-07-31 MED ORDER — QUETIAPINE FUMARATE 100 MG PO TABS
100.0000 mg | ORAL_TABLET | Freq: Every day | ORAL | Status: DC
  Administered 2018-07-31: 100 mg via ORAL
  Filled 2018-07-31 (×2): qty 1

## 2018-07-31 NOTE — Progress Notes (Addendum)
Patient ID: Gabriel Ashley, male   DOB: 20-Mar-1966, 52 y.o.   MRN: [id number]  Nursing Progress Note [id number]  Data: Patient presents with flat/sullen affect and depressed mood. Patient compliant with scheduled medications. Patient denies pain/physical complaints. Patient reports he is working on discharge options but did minimal during interactions with Probation officer and forwards little. Patient is seen attending groups and visible in the milieu. Patient currently reports passive SI this morning with no plan and denies HI/AVH.  Patient completed self-inventory sheet and rates depression, hopelessness, and anxiety 9,9,5 respectively. Patient rates their sleep and appetite as fair/fair respectively. Patient states goal for today is to "have patience".  Action: Patient is educated about and provided medication per provider's orders. Patient safety maintained with q15 min safety checks and frequent rounding. Low fall risk precautions in place. Emotional support given. 1:1 interaction and active listening provided. Patient encouraged to attend meals, groups, and work on treatment plan and goals. Labs, vital signs and patient behavior monitored throughout shift.   Response: Patient remains safe on the unit at this time and agrees to come to staff with any issues/concerns. Patient is interacting with peers appropriately on the unit. Will continue to support and monitor.

## 2018-07-31 NOTE — B[facility] Group Notes (Addendum)
Adult Nursing Psychoeducational Group Note  Date: 07/31/2018  Time: 3:00 PM  Group Topic/Focus: Relaxation Techniques Facilitator: Marzetta Merino RN  Participation Level:  Did Not Attend  Additional Comments:  Patient was invited but declined to attend group.  Gabriel Ashley 07/31/2018 3:30 PM

## 2018-07-31 NOTE — Plan of Care (Signed)
  Problem: Activity: Goal: Interest or engagement in activities will improve Outcome: Progressing   Problem: Health Behavior/Discharge Planning: Goal: Compliance with treatment plan for underlying cause of condition will improve Outcome: Progressing   Problem: Safety: Goal: Periods of time without injury will increase Outcome: Progressing   Baldo Daub, RN, BSN 07/31/2018 11:28 AM

## 2018-07-31 NOTE — Tx Team (Signed)
Interdisciplinary Treatment and Diagnostic Plan Update  07/31/2018 Time of Session:  0830AM Gabriel Ashley MRN: [id number]  Principal Diagnosis: MDD, recurrent, severe, without psychosis  Secondary Diagnoses: Principal Problem:   Severe recurrent major depression without psychotic features ([place]) Active Problems:   PTSD (post-traumatic stress disorder)   Cocaine dependence with cocaine-induced mood disorder ([place])   Current Medications:  Current Facility-Administered Medications  Medication Dose Route Frequency Provider Last Rate Last Dose  . albuterol (PROVENTIL HFA;VENTOLIN HFA) 108 (90 Base) MCG/ACT inhaler 2 puff  2 puff Inhalation Q6H PRN Rankin, Shuvon B, NP      . amLODipine (NORVASC) tablet 10 mg  10 mg Oral Daily Rankin, Shuvon B, NP   10 mg at 07/31/18 0832  . carvedilol (COREG) tablet 12.5 mg  12.5 mg Oral Daily Rankin, Shuvon B, NP   12.5 mg at 07/31/18 0832  . cloNIDine (CATAPRES) tablet 0.2 mg  0.2 mg Oral BID Rankin, Shuvon B, NP   0.2 mg at 07/31/18 0832  . furosemide (LASIX) tablet 40 mg  40 mg Oral Daily Rankin, Shuvon B, NP   40 mg at 07/31/18 0832  . hydrOXYzine (ATARAX/VISTARIL) tablet 50 mg  50 mg Oral Q8H PRN Pennelope Bracken, MD   50 mg at 07/30/18 2302  . multivitamin with minerals tablet 1 tablet  1 tablet Oral Daily Rankin, Shuvon B, NP   1 tablet at 07/31/18 0832  . QUEtiapine (SEROQUEL) tablet 100 mg  100 mg Oral QHS Maris Berger T, MD      . sertraline (ZOLOFT) tablet 100 mg  100 mg Oral Daily Suella Broad, FNP   100 mg at 07/31/18 4008  . traZODone (DESYREL) tablet 100 mg  100 mg Oral QHS PRN Rankin, Shuvon B, NP   100 mg at 07/30/18 2302   PTA Medications: Medications Prior to Admission  Medication Sig Dispense Refill Last Dose  . albuterol (PROVENTIL HFA;VENTOLIN HFA) 108 (90 Base) MCG/ACT inhaler Inhale 2 puffs into the lungs every 6 (six) hours as needed for wheezing or shortness of breath.   Past Week at Unknown time  .  amLODipine (NORVASC) 10 MG tablet Take 1 tablet (10 mg total) by mouth daily. 30 tablet 0 Past Week at Unknown time  . busPIRone (BUSPAR) 5 MG tablet Take 5 mg by mouth 2 (two) times daily.   Past Week at Unknown time  . carvedilol (COREG) 12.5 MG tablet Take 12.5 mg by mouth daily.   Past Week at Unknown time  . cloNIDine (CATAPRES) 0.2 MG tablet Take 1 tablet (0.2 mg total) by mouth 2 (two) times daily. 60 tablet 0 Past Week at Unknown time  . FLUoxetine (PROZAC) 20 MG capsule Take 20 mg by mouth daily.   Past Week at Unknown time  . furosemide (LASIX) 40 MG tablet Take 40 mg by mouth daily.   Past Week at Unknown time  . hydrOXYzine (VISTARIL) 25 MG capsule Take 25 mg by mouth every 8 (eight) hours as needed for anxiety.    Past Week at Unknown time  . Multiple Vitamin (MULTIVITAMIN) tablet Take 1 tablet by mouth daily.   Past Week at Unknown time  . sildenafil (VIAGRA) 100 MG tablet Take 100 mg by mouth as needed for erectile dysfunction.   11 Past Month at Unknown time  . traZODone (DESYREL) 100 MG tablet Take 100 mg by mouth at bedtime as needed for sleep.   Past Week at Unknown time    Treatment Modalities: Medication Management, Group  therapy, Case management,  1 to 1 session with clinician, Psychoeducation, Recreational therapy.   Physician Treatment Plan for Primary Diagnosis: MDD, recurrent, severe, without psychosis Medication Management: Evaluate patient's response, side effects, and tolerance of medication regimen.  Therapeutic Interventions: 1 to 1 sessions, Unit Group sessions and Medication administration.  Evaluation of Outcomes: Progressing   Physician Treatment Plan for Secondary Diagnosis: Principal Problem:   Severe recurrent major depression without psychotic features ([place]) Active Problems:   PTSD (post-traumatic stress disorder)   Cocaine dependence with cocaine-induced mood disorder ([place])   Medication Management: Evaluate patient's response, side effects, and  tolerance of medication regimen.  Therapeutic Interventions: 1 to 1 sessions, Unit Group sessions and Medication administration.  Evaluation of Outcomes: Progressing  RN Treatment Plan for Primary Diagnosis:MDD, recurrent, severe, without psychosis Long Term Goal(s): Knowledge of disease and therapeutic regimen to maintain health will improve  Short Term Goals: Ability to remain free from injury will improve, Ability to disclose and discuss suicidal ideas and Ability to identify and develop effective coping behaviors will improve  Medication Management: RN will administer medications as ordered by provider, will assess and evaluate patient's response and provide education to patient for prescribed medication. RN will report any adverse and/or side effects to prescribing provider.  Therapeutic Interventions: 1 on 1 counseling sessions, Psychoeducation, Medication administration, Evaluate responses to treatment, Monitor vital signs and CBGs as ordered, Perform/monitor CIWA, COWS, AIMS and Fall Risk screenings as ordered, Perform wound care treatments as ordered.  Evaluation of Outcomes: Progressing  LCSW Treatment Plan for Primary Diagnosis: Severe recurrent major depression without psychotic features ([place]) Long Term Goal(s): Safe transition to appropriate next level of care at discharge, Engage patient in therapeutic group addressing interpersonal concerns.  Short Term Goals: Engage patient in aftercare planning with referrals and resources, Increase emotional regulation, Facilitate patient progression through stages of change regarding substance use diagnoses and concerns and Identify triggers associated with mental health/substance abuse issues  Therapeutic Interventions: Assess for all discharge needs, 1 to 1 time with Social worker, Explore available resources and support systems, Assess for adequacy in community support network, Educate family and significant other(s) on suicide prevention,  Complete Psychosocial Assessment, Interpersonal group therapy.  Evaluation of Outcomes: Progressing  Progress in Treatment: Attending groups: Yes Participating in groups: Yes Taking medication as prescribed: Yes. Toleration medication: Yes. Family/Significant other contact made: SPE completed with pt; pt declined to consent to family contact.  Patient understands diagnosis: Yes. Discussing patient identified problems/goals with staff: Yes. Medical problems stabilized or resolved: Yes. Denies suicidal/homicidal ideation: No. passive SI/able to contract for safety. Continues to endorse SI thoughts.  Issues/concerns per patient self-inventory: no Other: n/a  New problem(s) identified: No, Describe:  n/a  New Short Term/Long Term Goal(s): detox, medication management for mood stabilization; elimination of SI thoughts; development of comprehensive mental wellness/sobriety plan.   Patient Goals:  "I need to get back on meds for PTSD and depression."  Discharge Plan or Barriers: Pt must attend face to face assessment at [facility] with substance abuse counselor in order to be approved for a referral to residential treatment through the [place]. Hospital follow-up appt with Mental Health Psychiatrist at the [facility]. Pt has been offered a [facility] referral and declined. Pt has been offered an [facility] referral and requested a day to think about this option. CSW continuing to assess. [place] pamphlet, Mobile Crisis information, and AA/NA information provided to patient for additional community support and resources.   Reason for Continuation  of Hospitalization: Depression Medication stabilization Suicidal ideation  Estimated Length of Stay: Friday, 08/02/18  Attendees: Patient: 07/31/2018 10:51 AM  Physician: Dr. Nancy Fetter MD 07/31/2018 10:51 AM  Nursing: Estill Bamberg RN; [facility] RN 07/31/2018 10:51 AM  RN Care Manager:x 07/31/2018 10:51 AM  Social Worker: Janice Norrie LCSW 07/31/2018 10:51 AM  Recreational Therapist: x 07/31/2018 10:51 AM  Other: Lindell Spar NP 07/31/2018 10:51 AM  Other:  07/31/2018 10:51 AM  Other: 07/31/2018 10:51 AM    Scribe for Treatment Team: Avelina Laine, LCSW 07/31/2018 10:51 AM

## 2018-07-31 NOTE — [facility] Group Notes (Signed)
[facility] Group Therapy 07/31/2018 1:15pm  Type of Therapy: Mental Health Association Presentation  Participation Level: Active  Participation Quality: Attentive  Affect: Appropriate  Cognitive: Oriented  Insight: Developing/Improving  Engagement in Therapy: Engaged  Modes of Intervention: Discussion, Education and Socialization  Summary of Progress/Problems: [place] Speaker came to talk about his personal journey with mental health. The pt processed ways by which to relate to the speaker. [place] speaker provided handouts and educational information pertaining to groups and services offered by the [facility]. Pt was engaged in speaker's presentation and was receptive to resources provided.    Avelina Laine, LCSW 07/31/2018 9:46 AM

## 2018-07-31 NOTE — Progress Notes (Signed)
[facility] MD Progress Note  07/31/2018 12:15 PM Gabriel Ashley  MRN:  [id number] Subjective:    History as per psychiatric intake: Gabriel Ashley is a 52 y/o M with history of MDD, PTSD, and cocaine use disorder who was admitted voluntarily from [facility] where he presented with worsening depression, SI with plan, and worsening use of crack cocaine. He also reported being off of his psychotropic medications for several months. He was medically cleared and then transferred to [facility] for additional treatment and stabilization. Upon evaluation, pt was seen in the office with SW team present. Pt has significantly depressed affect, somewhat increased latency of responses, poor eye contact, and low speech. His responses are somewhat minimal and vague at times, limiting his ability as a historian. However, he is generally cooperative with the interview. Pt shares, "I've just been feeling suicidal." He identifies plan of walking into traffic. He identifies main stressor as struggling with use of crack cocaine. When asked about cocaine use, pt is guarded and he does not share exactly how much he has been using aside from reporting that he uses "all day" multiple times per day. He denies HI/AH/VH. He endorses depressive symptoms of initial insomnia, anhedonia, guilty feelings, low energy, and poor concentration. He denies symptoms of mania/hypomania and OCD. He endorses PTSD symptoms of flashbacks, nightmares, hypervigilance, hyperarousal, and avoidance. He smokes 1ppd and he denies alcohol use; he denies other illicit substance use. Discussed with patient about treatment options. He has previous trials of prozac, buspar, and lexaparo, and he reports these medications have not been helpful. He is in agreement to trial of zoloft, vistaril, and trazodone. He is in agreement to be referred to substance use treatment, and he voices preference to be referred to the [place]. Pt was in agreement with the above plan, and he had no further  questions, comments, or concerns.  As per evaluation today: Today upon evaluation, pt shares, "I'm about the same." Pt reports ongoing depression, anxiety, and SI without plan. He is able to contract for safety while in the hospital. He also reports that his sleep remains poor which he partially attributes to not having access to his C-PAP machine (it is in his vehicle which is in the ED parking lot, and we cannot access it). Discussed with patient about elevating his head with an additional pillow tonight to help with opening his airway further, and pt will attempt this. He denies any other specific concerns. His appetite is fair. He denies other physical complaints. He denies HI/AH/VH. He is tolerating his medications well, and he is in agreement to continue his current regimen without changes. He requests for trial of seroquel to help with sleep. SW team was present for interview, and discussed with patient that he will need to go in for an intake interview at the [place] prior to being referred for residential substance use treatment, and we discussed about alternative option of referral to a program such as [facility] where he could go directly from [facility] at time of discharge. Pt would like some time to consider these options. He was in agreement with the above plan, and he had no further questions, comments, or concerns.  Principal Problem: Severe recurrent major depression without psychotic features ([place]) Diagnosis:   Patient Active Problem List   Diagnosis Date Noted  . Cocaine dependence with cocaine-induced mood disorder ([place]) [F14.24]   . MDD (major depressive disorder), recurrent episode ([place]) [F33.9] 04/25/2017  . Acute renal failure (ARF) ([place]) [N17.9] 04/20/2017  . Hypertensive  urgency [I16.0] 04/20/2017  . Acute congestive heart failure with left ventricular diastolic dysfunction ([place]) [I50.31] 12/04/2016  . Essential hypertension [I10]   . Substance abuse ([place]) [F19.10] 10/26/2014  . Substance  induced mood disorder ([place]) [F19.94]   . Major depressive disorder, recurrent, severe without psychotic features ([place]) [F33.2]   . Severe major depression ([place]) [F32.2] 10/23/2014  . PTSD (post-traumatic stress disorder) [F43.10]   . Essential hypertension, benign [I10] 05/11/2014  . Elevated serum creatinine [R79.89] 05/08/2014  . Depressive disorder [F32.9] 05/07/2014  . Suicidal ideation [R45.851] 05/07/2014  . Homicidal ideation [R45.850] 05/07/2014  . Anxiety [F41.9] 05/07/2014  . Severe recurrent major depression without psychotic features ([place]) [F33.2] 05/07/2014   Total Time spent with patient: 30 minutes  Past Psychiatric History: see H&P  Past Medical History:  Past Medical History:  Diagnosis Date  . Anemia   . Anxiety   . Asthma   . Cannabis use in remisison   . Cocaine use   . ED (erectile dysfunction)   . Hypertension   . Insomnia   . Kidney disease   . OSA (obstructive sleep apnea)   . Post traumatic stress disorder (PTSD)   . Vitamin D deficiency    History reviewed. No pertinent surgical history. Family History:  Family History  Problem Relation Age of Onset  . Breast cancer Mother   . Cancer Father        type unknown   Family Psychiatric  History: see H&P Social History:  Social History   Substance and Sexual Activity  Alcohol Use Not Currently   Comment: 2-3 years ago     Social History   Substance and Sexual Activity  Drug Use Yes  . Types: Cocaine, Marijuana    Social History   Socioeconomic History  . Marital status: Single    Spouse name: Not on file  . Number of children: 1  . Years of education: Not on file  . Highest education level: Not on file  Occupational History  . Not on file  Social Needs  . Financial resource strain: Not on file  . Food insecurity:    Worry: Not on file    Inability: Not on file  . Transportation needs:    Medical: Not on file    Non-medical: Not on file  Tobacco Use  . Smoking status: Former  Smoker    Packs/day: 1.00    Years: 31.00    Pack years: 31.00    Last attempt to quit: 11/03/2017    Years since quitting: 0.7  . Smokeless tobacco: Never Used  Substance and Sexual Activity  . Alcohol use: Not Currently    Comment: 2-3 years ago  . Drug use: Yes    Types: Cocaine, Marijuana  . Sexual activity: Yes  Lifestyle  . Physical activity:    Days per week: Not on file    Minutes per session: Not on file  . Stress: Not on file  Relationships  . Social connections:    Talks on phone: Not on file    Gets together: Not on file    Attends religious service: Not on file    Active member of club or organization: Not on file    Attends meetings of clubs or organizations: Not on file    Relationship status: Not on file  Other Topics Concern  . Not on file  Social History Narrative  . Not on file   Additional Social History:    History of alcohol /  drug use?: Yes                    Sleep: Fair  Appetite:  Good  Current Medications: Current Facility-Administered Medications  Medication Dose Route Frequency Provider Last Rate Last Dose  . albuterol (PROVENTIL HFA;VENTOLIN HFA) 108 (90 Base) MCG/ACT inhaler 2 puff  2 puff Inhalation Q6H PRN Rankin, Shuvon B, NP      . amLODipine (NORVASC) tablet 10 mg  10 mg Oral Daily Rankin, Shuvon B, NP   10 mg at 07/31/18 0832  . carvedilol (COREG) tablet 12.5 mg  12.5 mg Oral Daily Rankin, Shuvon B, NP   12.5 mg at 07/31/18 0832  . cloNIDine (CATAPRES) tablet 0.2 mg  0.2 mg Oral BID Rankin, Shuvon B, NP   0.2 mg at 07/31/18 0832  . furosemide (LASIX) tablet 40 mg  40 mg Oral Daily Rankin, Shuvon B, NP   40 mg at 07/31/18 0832  . hydrOXYzine (ATARAX/VISTARIL) tablet 50 mg  50 mg Oral Q8H PRN Pennelope Bracken, MD   50 mg at 07/30/18 2302  . multivitamin with minerals tablet 1 tablet  1 tablet Oral Daily Rankin, Shuvon B, NP   1 tablet at 07/31/18 0832  . QUEtiapine (SEROQUEL) tablet 100 mg  100 mg Oral QHS Maris Berger T, MD      . sertraline (ZOLOFT) tablet 100 mg  100 mg Oral Daily Suella Broad, FNP   100 mg at 07/31/18 6295  . traZODone (DESYREL) tablet 100 mg  100 mg Oral QHS PRN Rankin, Shuvon B, NP   100 mg at 07/30/18 2302    Lab Results: No results found for this or any previous visit (from the past 48 hour(s)).  Blood Alcohol level:  Lab Results  Component Value Date   ETH <10 07/23/2018   ETH <5 28/41/3244    Metabolic Disorder Labs: Lab Results  Component Value Date   HGBA1C 5.6 07/28/2018   MPG 114.02 07/28/2018   No results found for: PROLACTIN Lab Results  Component Value Date   CHOL 147 07/28/2018   TRIG 57 07/28/2018   HDL 41 07/28/2018   CHOLHDL 3.6 07/28/2018   VLDL 11 07/28/2018   LDLCALC 95 07/28/2018    Physical Findings: AIMS: Facial and Oral Movements Muscles of Facial Expression: None, normal Lips and Perioral Area: None, normal Jaw: None, normal Tongue: None, normal,Extremity Movements Upper (arms, wrists, hands, fingers): None, normal Lower (legs, knees, ankles, toes): None, normal, Trunk Movements Neck, shoulders, hips: None, normal, Overall Severity Severity of abnormal movements (highest score from questions above): None, normal Incapacitation due to abnormal movements: None, normal Patient's awareness of abnormal movements (rate only patient's report): No Awareness, Dental Status Current problems with teeth and/or dentures?: No Does patient usually wear dentures?: No  CIWA:    COWS:     Musculoskeletal: Strength & Muscle Tone: within normal limits Gait & Station: normal Patient leans: N/A  Psychiatric Specialty Exam: Physical Exam  Nursing note and vitals reviewed.   Review of Systems  Constitutional: Negative for chills and fever.  Respiratory: Negative for cough and shortness of breath.   Cardiovascular: Negative for chest pain.  Gastrointestinal: Negative for abdominal pain, heartburn, nausea and vomiting.   Psychiatric/Behavioral: Positive for depression and suicidal ideas. Negative for hallucinations. The patient has insomnia. The patient is not nervous/anxious.     Blood pressure (!) 141/80, pulse (!) 57, temperature 98.2 F (36.8 C), temperature source Oral, resp. rate 20, height 5'  11" (1.803 m), weight 86.2 kg, SpO2 100 %.Body mass index is 26.5 kg/m.  General Appearance: Casual and Fairly Groomed  Eye Contact:  Good  Speech:  Clear and Coherent and Normal Rate  Volume:  Normal  Mood:  Anxious and Depressed  Affect:  Congruent and Depressed  Thought Process:  Coherent and Goal Directed  Orientation:  Full (Time, Place, and Person)  Thought Content:  Logical  Suicidal Thoughts:  Yes.  without intent/plan  Homicidal Thoughts:  No  Memory:  Immediate;   Fair Recent;   Fair Remote;   Fair  Judgement:  Fair  Insight:  Fair  Psychomotor Activity:  Normal  Concentration:  Concentration: Fair  Recall:  [employer] of Knowledge:  Fair  Language:  Fair  Akathisia:  No  Handed:    AIMS (if indicated):     Assets:  Resilience Social Support  ADL's:  Intact  Cognition:  WNL  Sleep:  Number of Hours: 5.5   Treatment Plan Summary: Daily contact with patient to assess and evaluate symptoms and progress in treatment and Medication management   -Continue hospitalization  -PTSD   -Continue zoloft 100mg  po qDay   -Start seroquel 100mg  po qhs    -Anxiety   -Continue vistaril 50mg  po q6h prn anxiety  -insomnia   -Continue Continue trazodone 100mg  po qhs prn insomnia  -asthma   -Continue albuterol 108 mcg/ACT take 2 puffs inhaled q6h prn wheeze/SOB  -HTN   -Continue amlodipine 10mg  po qDay   -Continue clonidine 0.2mg  po BID   -Continue furosemide 40mg  po qDay  -Encourage participation in groups and therapeutic milieu  -disposition planning will be ongoing  Pennelope Bracken, MD 07/31/2018, 12:15 PM

## 2018-07-31 NOTE — Progress Notes (Signed)
Pt observed in the dayroom, seen watching TV. Pt appears animated in affect and mood. Pt denies HI/AVH/Pain at this time. Endorses passive SI but verbal contracts for safety. Seroquel was started at bedtime. Support and encouragement provided. Will continue with POC.

## 2018-08-01 MED ORDER — QUETIAPINE FUMARATE 200 MG PO TABS
200.0000 mg | ORAL_TABLET | Freq: Every day | ORAL | Status: DC
  Administered 2018-08-01 – 2018-08-05 (×5): 200 mg via ORAL
  Filled 2018-08-01 (×3): qty 1
  Filled 2018-08-01: qty 7
  Filled 2018-08-01 (×3): qty 1
  Filled 2018-08-01: qty 7

## 2018-08-01 NOTE — Progress Notes (Signed)
Pt observed in the dayroom, seen watching TV. Pt appears animated in affect and mood. Pt denies HI/AVH/Pain at this time. Endorses passive SI but verbal contracts for safety. Seroquel was increase to 200 at bedtime. PRN vistaril requested and given.Support and encouragement provided. Will continue with POC.

## 2018-08-01 NOTE — Progress Notes (Signed)
CSW and MD met with pt to discuss aftercare and progress. Pt continues to report passive SI. He denies HI/AVH. Pt agreeable to referral to [facility] but expressed worry about his car which is parked at the [place] ed parking lot. Pt reports he is behind on payments and requested number to [place], as he is worried that they will repossess his car. Pt willing to drive himself to [facility] if accepted and feels safe to do so.   [facility] referral faxed to admissions 08/01/2018 10:53 AM. Venetia Constable Authorization: [id number] valid from 08/01/18-08/07/18.  Manuel Dall S. Ouida Sills, MSW, LCSW Clinical Social Worker 08/01/2018 10:54 AM

## 2018-08-01 NOTE — [facility] Group Notes (Signed)
Pt was invited, but did not attend orientation / goals group.

## 2018-08-01 NOTE — Plan of Care (Signed)
  Problem: Safety: Goal: Periods of time without injury will increase Outcome: Progressing   Problem: Activity: Goal: Interest or engagement in leisure activities will improve Outcome: Progressing   D: Pt alert and oriented on the unit. Pt engaging with RN staff and other pts. Pt denies HI, A/VH, and passive for SI. Pt's affect was flat and sad. Pt isolated in his room most of the day but sat in the day room towards the evening. Pt is pleasant and cooperative. A: Education, support and encouragement provided, q15 minute safety checks remain in effect. Medications administered per MD orders. R: No reactions/side effects to medicine noted. Pt denies any concerns at this time, and verbally contracts for safety. Pt ambulating on the unit with no issues. Pt remains safe on and off the unit.

## 2018-08-01 NOTE — Progress Notes (Signed)
[facility] MD Progress Note  08/01/2018 11:29 AM Gabriel Ashley  MRN:  [id number] Subjective:    History as per psychiatric intake: Gabriel Ashley is a 52 y/o M with history of MDD, PTSD, and cocaine use disorder who was admitted voluntarily from [place] where he presented with worsening depression, SI with plan, and worsening use of crack cocaine. He also reported being off of his psychotropic medications for several months. He was medically cleared and then transferred to [facility] for additional treatment and stabilization. Upon evaluation, pt was seen in the office with SW team present. Pt has significantly depressed affect, somewhat increased latency of responses, poor eye contact, and low speech. His responses are somewhat minimal and vague at times, limiting his ability as a historian. However, he is generally cooperative with the interview. Pt shares, "I've just been feeling suicidal." He identifies plan of walking into traffic. He identifies main stressor as struggling with use of crack cocaine. When asked about cocaine use, pt is guarded and he does not share exactly how much he has been using aside from reporting that he uses "all day" multiple times per day. He denies HI/AH/VH. He endorses depressive symptoms of initial insomnia, anhedonia, guilty feelings, low energy, and poor concentration. He denies symptoms of mania/hypomania and OCD. He endorses PTSD symptoms of flashbacks, nightmares, hypervigilance, hyperarousal, and avoidance. He smokes 1ppd and he denies alcohol use; he denies other illicit substance use. Discussed with patient about treatment options. He has previous trials of prozac, buspar, and lexaparo, and he reports these medications have not been helpful. He is in agreement to trial of zoloft, vistaril, and trazodone. He is in agreement to be referred to substance use treatment, and he voices preference to be referred to the [place]. Pt was in agreement with the above plan, and he had no further  questions, comments, or concerns.  As per evaluation today: Today upon evaluation, pt shares, "I'm okay." Pt reports ongoing depression and SI without plan. He is able to contract for safety inside the hospital. His sleep is mildly improved with starting seroquel. His appetite is good. He denies other physical complaints. He denies HI/AH/VH. He is tolerating his medications well overall. We discussed about increasing dose of seroquel and pt was in agreement. Discussed with patient with SW team present regarding referral to substance use treatment, and we reviewed pt's options to discharge to his home so that he could complete face-to-face intake at the [place] prior to admission to substance use treatment there or to be referred to an alternative option for substance use treatment with plan to directly discharge there from this hospitalization (such as [facility]). Pt feels he would not be able to maintain his own safety or sobriety if he were to discharge from this hospital stay at this point, so we will plan to begin referral process to [place]. Pt was in agreement with the above plan, and he had no further questions, comments, or concerns.  Principal Problem: Severe recurrent major depression without psychotic features ([name]) Diagnosis:   Patient Active Problem List   Diagnosis Date Noted  . Cocaine dependence with cocaine-induced mood disorder ([name]) [F14.24]   . MDD (major depressive disorder), recurrent episode ([name]) [F33.9] 04/25/2017  . Acute renal failure (ARF) ([name]) [N17.9] 04/20/2017  . Hypertensive urgency [I16.0] 04/20/2017  . Acute congestive heart failure with left ventricular diastolic dysfunction ([name]) [I50.31] 12/04/2016  . Essential hypertension [I10]   . Substance abuse ([place]) [F19.10] 10/26/2014  . Substance induced mood disorder ([place]) [F19.94]   .  Major depressive disorder, recurrent, severe without psychotic features ([name]) [F33.2]   . Severe major depression ([name]) [F32.2] 10/23/2014  .  PTSD (post-traumatic stress disorder) [F43.10]   . Essential hypertension, benign [I10] 05/11/2014  . Elevated serum creatinine [R79.89] 05/08/2014  . Depressive disorder [F32.9] 05/07/2014  . Suicidal ideation [R45.851] 05/07/2014  . Homicidal ideation [R45.850] 05/07/2014  . Anxiety [F41.9] 05/07/2014  . Severe recurrent major depression without psychotic features ([name]) [F33.2] 05/07/2014   Total Time spent with patient: 30 minutes  Past Psychiatric History: see H&P  Past Medical History:  Past Medical History:  Diagnosis Date  . Anemia   . Anxiety   . Asthma   . Cannabis use in remisison   . Cocaine use   . ED (erectile dysfunction)   . Hypertension   . Insomnia   . Kidney disease   . OSA (obstructive sleep apnea)   . Post traumatic stress disorder (PTSD)   . Vitamin D deficiency    History reviewed. No pertinent surgical history. Family History:  Family History  Problem Relation Age of Onset  . Breast cancer Mother   . Cancer Father        type unknown   Family Psychiatric  History: see H&P Social History:  Social History   Substance and Sexual Activity  Alcohol Use Not Currently   Comment: 2-3 years ago     Social History   Substance and Sexual Activity  Drug Use Yes  . Types: Cocaine, Marijuana    Social History   Socioeconomic History  . Marital status: Single    Spouse name: Not on file  . Number of children: 1  . Years of education: Not on file  . Highest education level: Not on file  Occupational History  . Not on file  Social Needs  . Financial resource strain: Not on file  . Food insecurity:    Worry: Not on file    Inability: Not on file  . Transportation needs:    Medical: Not on file    Non-medical: Not on file  Tobacco Use  . Smoking status: Former Smoker    Packs/day: 1.00    Years: 31.00    Pack years: 31.00    Last attempt to quit: 11/03/2017    Years since quitting: 0.7  . Smokeless tobacco: Never Used  Substance and  Sexual Activity  . Alcohol use: Not Currently    Comment: 2-3 years ago  . Drug use: Yes    Types: Cocaine, Marijuana  . Sexual activity: Yes  Lifestyle  . Physical activity:    Days per week: Not on file    Minutes per session: Not on file  . Stress: Not on file  Relationships  . Social connections:    Talks on phone: Not on file    Gets together: Not on file    Attends religious service: Not on file    Active member of club or organization: Not on file    Attends meetings of clubs or organizations: Not on file    Relationship status: Not on file  Other Topics Concern  . Not on file  Social History Narrative  . Not on file   Additional Social History:    History of alcohol / drug use?: Yes                    Sleep: Fair  Appetite:  Good  Current Medications: Current Facility-Administered Medications  Medication Dose Route Frequency Provider  Last Rate Last Dose  . albuterol (PROVENTIL HFA;VENTOLIN HFA) 108 (90 Base) MCG/ACT inhaler 2 puff  2 puff Inhalation Q6H PRN [place], Shuvon B, NP      . amLODipine (NORVASC) tablet 10 mg  10 mg Oral Daily Rankin, Shuvon B, NP   10 mg at 08/01/18 0800  . carvedilol (COREG) tablet 12.5 mg  12.5 mg Oral Daily Rankin, Shuvon B, NP   12.5 mg at 08/01/18 0800  . cloNIDine (CATAPRES) tablet 0.2 mg  0.2 mg Oral BID Rankin, Shuvon B, NP   0.2 mg at 08/01/18 0800  . furosemide (LASIX) tablet 40 mg  40 mg Oral Daily Rankin, Shuvon B, NP   40 mg at 08/01/18 0759  . hydrOXYzine (ATARAX/VISTARIL) tablet 50 mg  50 mg Oral Q8H PRN Pennelope Bracken, MD   50 mg at 07/30/18 2302  . multivitamin with minerals tablet 1 tablet  1 tablet Oral Daily Rankin, Shuvon B, NP   1 tablet at 08/01/18 0800  . QUEtiapine (SEROQUEL) tablet 200 mg  200 mg Oral QHS Maris Berger T, MD      . sertraline (ZOLOFT) tablet 100 mg  100 mg Oral Daily Suella Broad, FNP   100 mg at 08/01/18 0759  . traZODone (DESYREL) tablet 100 mg  100 mg Oral  QHS PRN Rankin, Shuvon B, NP   100 mg at 07/31/18 2146    Lab Results: No results found for this or any previous visit (from the past 48 hour(s)).  Blood Alcohol level:  Lab Results  Component Value Date   ETH <10 07/23/2018   ETH <5 78/29/5621    Metabolic Disorder Labs: Lab Results  Component Value Date   HGBA1C 5.6 07/28/2018   MPG 114.02 07/28/2018   No results found for: PROLACTIN Lab Results  Component Value Date   CHOL 147 07/28/2018   TRIG 57 07/28/2018   HDL 41 07/28/2018   CHOLHDL 3.6 07/28/2018   VLDL 11 07/28/2018   LDLCALC 95 07/28/2018    Physical Findings: AIMS: Facial and Oral Movements Muscles of Facial Expression: None, normal Lips and Perioral Area: None, normal Jaw: None, normal Tongue: None, normal,Extremity Movements Upper (arms, wrists, hands, fingers): None, normal Lower (legs, knees, ankles, toes): None, normal, Trunk Movements Neck, shoulders, hips: None, normal, Overall Severity Severity of abnormal movements (highest score from questions above): None, normal Incapacitation due to abnormal movements: None, normal Patient's awareness of abnormal movements (rate only patient's report): No Awareness, Dental Status Current problems with teeth and/or dentures?: No Does patient usually wear dentures?: No  CIWA:    COWS:     Musculoskeletal: Strength & Muscle Tone: within normal limits Gait & Station: normal Patient leans: N/A  Psychiatric Specialty Exam: Physical Exam  Nursing note and vitals reviewed.   Review of Systems  Constitutional: Negative for chills and fever.  Respiratory: Negative for cough and shortness of breath.   Cardiovascular: Negative for chest pain.  Gastrointestinal: Negative for abdominal pain, heartburn, nausea and vomiting.  Psychiatric/Behavioral: Positive for depression and suicidal ideas. Negative for hallucinations. The patient has insomnia. The patient is not nervous/anxious.     Blood pressure 117/65,  pulse 67, temperature 98.2 F (36.8 C), temperature source Oral, resp. rate 20, height 5\' 11"  (1.803 m), weight 86.2 kg, SpO2 100 %.Body mass index is 26.5 kg/m.  General Appearance: Casual and Fairly Groomed  Eye Contact:  Good  Speech:  Clear and Coherent and Normal Rate  Volume:  Normal  Mood:  Depressed  Affect:  Congruent, Constricted and Depressed  Thought Process:  Coherent and Goal Directed  Orientation:  Full (Time, Place, and Person)  Thought Content:  Logical  Suicidal Thoughts:  Yes.  without intent/plan  Homicidal Thoughts:  No  Memory:  Immediate;   Fair Recent;   Fair Remote;   Fair  Judgement:  Poor  Insight:  Lacking  Psychomotor Activity:  Normal  Concentration:  Concentration: Fair  Recall:  [employer] of Knowledge:  Fair  Language:  Fair  Akathisia:  No  Handed:    AIMS (if indicated):     Assets:  Resilience Social Support  ADL's:  Intact  Cognition:  WNL  Sleep:  Number of Hours: 6.75   Treatment Plan Summary: Daily contact with patient to assess and evaluate symptoms and progress in treatment and Medication management   -Continue inpatient hospitalization  -PTSD             -Continue zoloft 100mg  po qDay             -Change seroquel 100mg  po qhs to seroquel 200mg  po qhs              -Anxiety             -Continue vistaril 50mg  po q6h prn anxiety  -insomnia              -Continue Continue trazodone 100mg  po qhs prn insomnia  -asthma              -Continue albuterol 108 mcg/ACT take 2 puffs inhaled q6h prn wheeze/SOB  -HTN             -Continue amlodipine 10mg  po qDay             -Continue clonidine 0.2mg  po BID             -Continue furosemide 40mg  po qDay  -Encourage participation in groups and therapeutic milieu  -disposition planning will be ongoing  Pennelope Bracken, MD 08/01/2018, 11:29 AM

## 2018-08-01 NOTE — [facility] Group Notes (Signed)
LCSW Group Therapy Note   08/01/2018 1:15pm   Type of Therapy and Topic:  Group Therapy:  Overcoming Obstacles   Participation Level:  Did Not Attend--pt invited. Chose to remain in bed.    Description of Group:    In this group patients will be encouraged to explore what they see as obstacles to their own wellness and recovery. They will be guided to discuss their thoughts, feelings, and behaviors related to these obstacles. The group will process together ways to cope with barriers, with attention given to specific choices patients can make. Each patient will be challenged to identify changes they are motivated to make in order to overcome their obstacles. This group will be process-oriented, with patients participating in exploration of their own experiences as well as giving and receiving support and challenge from other group members.   Therapeutic Goals: 1. Patient will identify personal and current obstacles as they relate to admission. 2. Patient will identify barriers that currently interfere with their wellness or overcoming obstacles.  3. Patient will identify feelings, thought process and behaviors related to these barriers. 4. Patient will identify two changes they are willing to make to overcome these obstacles:      Summary of Patient Progress   x   Therapeutic Modalities:   Cognitive Behavioral Therapy Solution Focused Therapy Motivational Interviewing Relapse Prevention Therapy  Avelina Laine, LCSW 08/01/2018 11:05 AM

## 2018-08-02 MED ORDER — ACETAMINOPHEN 325 MG PO TABS
650.0000 mg | ORAL_TABLET | Freq: Four times a day (QID) | ORAL | Status: DC | PRN
  Administered 2018-08-02 – 2018-08-04 (×2): 650 mg via ORAL
  Filled 2018-08-02 (×2): qty 2

## 2018-08-02 NOTE — [facility] Group Notes (Signed)
LCSW Group Therapy Note  08/02/2018 1:15pm  Type of Therapy and Topic:  Group Therapy: Avoiding Self-Sabotaging and Enabling Behaviors  Participation Level:  Active   Description of Group:   In this group, patients will learn how to identify obstacles, self-sabotaging and enabling behaviors, as well as: what are they, why do we do them and what needs these behaviors meet. Discuss unhealthy relationships and how to have positive healthy boundaries with those that sabotage and enable. Explore aspects of self-sabotage and enabling in yourself and how to limit these self-destructive behaviors in everyday life.   Therapeutic Goals: 1. Patient will identify one obstacle that relates to self-sabotage and enabling behaviors 2. Patient will identify one personal self-sabotaging or enabling behavior they did prior to admission 3. Patient will state a plan to change the above identified behavior 4. Patient will demonstrate ability to communicate their needs through discussion and/or role play.   Summary of Patient Progress:  Jeanne was attentive and engaged during today's processing group. He shared that he has engaged in self sabatoge "because I'm afraid of failing, so I set myself up to fail." Cheng shared that he wants to set small goals for himself instead of big goals "so I don't get too ahead of myself." Gaspard continues to show progress in the group setting with improving insight.   Therapeutic Modalities:   Cognitive Behavioral Therapy Person-Centered Therapy Motivational Interviewing   Avelina Laine, [place] 08/02/2018 10:52 AM

## 2018-08-02 NOTE — Progress Notes (Signed)
D: Pt was in dayroom upon initial approach.  Pt presents with depressed, anxious affect and mood.  He describes his day as "fair."  His goal tonight is to "go to group" and pt met goal.  Pt denies HI, denies hallucinations, reports back pain of 5/10.  Pt reports passive SI without a plan.  Pt verbally contracts for safety.  Pt has been visible in milieu interacting with peers and staff appropriately.  He has been playing cards with peers in the dayroom after group.  A: Introduced self to pt.  Met with pt 1:1.  Actively listened to pt and offered support and encouragement.  Medication administered per order.  PRN medication administered for anxiety and pain.  Q15 minute safety checks maintained.  R: Pt is safe on the unit.  Pt is compliant with medications.  Pt verbally contracts for safety.  Will continue to monitor and assess.

## 2018-08-02 NOTE — Plan of Care (Signed)
  Problem: Education: Goal: Knowledge of [facility] Education information/materials will improve Outcome: Progressing Goal: Emotional status will improve Outcome: Progressing

## 2018-08-02 NOTE — Progress Notes (Signed)
D Pt is observed OOB UAL on the 300 hall today. HE tolerates this fairly well. HE endorses a flat, sad depressed affect. HE makes eye contact with this Probation officer. HE is dressed in hospital 0-issue patient scrubs.     A HE completed his daily assessment and on this he wrote he has experienced SI today and he rated his depression, hopeelssness and anxeity " 8-9/8/7", respectively. He says " I just don't kown" in repsonse to writer asking him where he is going when he is discharged.    R Safety is in place.

## 2018-08-02 NOTE — Progress Notes (Signed)
[facility] MD Progress Note  08/02/2018 9:06 AM [name]  MRN:  [id number] Subjective:    History as per psychiatric intake: Gabriel Ashley is a 52 y/o M with history of MDD, PTSD, and cocaine use disorder who was admitted voluntarily from [place] where he presented with worsening depression, SI with plan, and worsening use of crack cocaine. He also reported being off of his psychotropic medications for several months. He was medically cleared and then transferred to [facility] for additional treatment and stabilization. Upon evaluation, pt was seen in the office with SW team present. Pt has significantly depressed affect, somewhat increased latency of responses, poor eye contact, and low speech. His responses are somewhat minimal and vague at times, limiting his ability as a historian. However, he is generally cooperative with the interview. Pt shares, "I've just been feeling suicidal." He identifies plan of walking into traffic. He identifies main stressor as struggling with use of crack cocaine. When asked about cocaine use, pt is guarded and he does not share exactly how much he has been using aside from reporting that he uses "all day" multiple times per day. He denies HI/AH/VH. He endorses depressive symptoms of initial insomnia, anhedonia, guilty feelings, low energy, and poor concentration. He denies symptoms of mania/hypomania and OCD. He endorses PTSD symptoms of flashbacks, nightmares, hypervigilance, hyperarousal, and avoidance. He smokes 1ppd and he denies alcohol use; he denies other illicit substance use. Discussed with patient about treatment options. He has previous trials of prozac, buspar, and lexaparo, and he reports these medications have not been helpful. He is in agreement to trial of zoloft, vistaril, and trazodone. He is in agreement to be referred to substance use treatment, and he voices preference to be referred to the [place]. Pt was in agreement with the above plan, and he had no further questions,  comments, or concerns.  As per evaluation today: Today upon evaluation, pt shares, "I'm about the same." Pt reports ongoing depression and SI without plan. He rates SI as "8-9/10" in intensity today. He is able to contract for safety inside the hospital. His sleep is improved with seroquel, and pt reports "I'm not feeling as dragging in the morning." His appetite is good. He denies other physical complaints. He denies HI/AH/VH. He is tolerating his medications well overall, and he is in agreement to continue his current regimen without changes. Pt remains in agreement for referral to [place], and he was accepted into the program for next Tuesday. Pt would like to check on his truck to be sure it was not repossessed, as it contains all of his belongings. Pt was in agreement with the above plan, and he had no further questions, comments, or concerns.  Principal Problem: Severe recurrent major depression without psychotic features ([place]) Diagnosis:   Patient Active Problem List   Diagnosis Date Noted  . Cocaine dependence with cocaine-induced mood disorder ([place]) [F14.24]   . MDD (major depressive disorder), recurrent episode ([place]) [F33.9] 04/25/2017  . Acute renal failure (ARF) ([place]) [N17.9] 04/20/2017  . Hypertensive urgency [I16.0] 04/20/2017  . Acute congestive heart failure with left ventricular diastolic dysfunction ([place]) [I50.31] 12/04/2016  . Essential hypertension [I10]   . Substance abuse ([place]) [F19.10] 10/26/2014  . Substance induced mood disorder ([place]) [F19.94]   . Major depressive disorder, recurrent, severe without psychotic features ([place]) [F33.2]   . Severe major depression ([place]) [F32.2] 10/23/2014  . PTSD (post-traumatic stress disorder) [F43.10]   . Essential hypertension, benign [I10] 05/11/2014  . Elevated serum creatinine [  R79.89] 05/08/2014  . Depressive disorder [F32.9] 05/07/2014  . Suicidal ideation [R45.851] 05/07/2014  . Homicidal ideation [R45.850] 05/07/2014  . Anxiety  [F41.9] 05/07/2014  . Severe recurrent major depression without psychotic features ([place]) [F33.2] 05/07/2014   Total Time spent with patient: 30 minutes  Past Psychiatric History: see H&P  Past Medical History:  Past Medical History:  Diagnosis Date  . Anemia   . Anxiety   . Asthma   . Cannabis use in remisison   . Cocaine use   . ED (erectile dysfunction)   . Hypertension   . Insomnia   . Kidney disease   . OSA (obstructive sleep apnea)   . Post traumatic stress disorder (PTSD)   . Vitamin D deficiency    History reviewed. No pertinent surgical history. Family History:  Family History  Problem Relation Age of Onset  . Breast cancer Mother   . Cancer Father        type unknown   Family Psychiatric  History: see H&P Social History:  Social History   Substance and Sexual Activity  Alcohol Use Not Currently   Comment: 2-3 years ago     Social History   Substance and Sexual Activity  Drug Use Yes  . Types: Cocaine, Marijuana    Social History   Socioeconomic History  . Marital status: Single    Spouse name: Not on file  . Number of children: 1  . Years of education: Not on file  . Highest education level: Not on file  Occupational History  . Not on file  Social Needs  . Financial resource strain: Not on file  . Food insecurity:    Worry: Not on file    Inability: Not on file  . Transportation needs:    Medical: Not on file    Non-medical: Not on file  Tobacco Use  . Smoking status: Former Smoker    Packs/day: 1.00    Years: 31.00    Pack years: 31.00    Last attempt to quit: 11/03/2017    Years since quitting: 0.7  . Smokeless tobacco: Never Used  Substance and Sexual Activity  . Alcohol use: Not Currently    Comment: 2-3 years ago  . Drug use: Yes    Types: Cocaine, Marijuana  . Sexual activity: Yes  Lifestyle  . Physical activity:    Days per week: Not on file    Minutes per session: Not on file  . Stress: Not on file  Relationships  .  Social connections:    Talks on phone: Not on file    Gets together: Not on file    Attends religious service: Not on file    Active member of club or organization: Not on file    Attends meetings of clubs or organizations: Not on file    Relationship status: Not on file  Other Topics Concern  . Not on file  Social History Narrative  . Not on file   Additional Social History:    History of alcohol / drug use?: Yes                    Sleep: Good  Appetite:  Good  Current Medications: Current Facility-Administered Medications  Medication Dose Route Frequency Provider Last Rate Last Dose  . albuterol (PROVENTIL HFA;VENTOLIN HFA) 108 (90 Base) MCG/ACT inhaler 2 puff  2 puff Inhalation Q6H PRN Rankin, Shuvon B, NP      . amLODipine (NORVASC) tablet 10 mg  10 mg Oral Daily Rankin, Shuvon B, NP   10 mg at 08/02/18 0852  . carvedilol (COREG) tablet 12.5 mg  12.5 mg Oral Daily Rankin, Shuvon B, NP   12.5 mg at 08/02/18 0852  . cloNIDine (CATAPRES) tablet 0.2 mg  0.2 mg Oral BID Rankin, Shuvon B, NP   0.2 mg at 08/02/18 0853  . furosemide (LASIX) tablet 40 mg  40 mg Oral Daily Rankin, Shuvon B, NP   40 mg at 08/02/18 0852  . hydrOXYzine (ATARAX/VISTARIL) tablet 50 mg  50 mg Oral Q8H PRN Pennelope Bracken, MD   50 mg at 08/01/18 2148  . multivitamin with minerals tablet 1 tablet  1 tablet Oral Daily Rankin, Shuvon B, NP   1 tablet at 08/02/18 0853  . QUEtiapine (SEROQUEL) tablet 200 mg  200 mg Oral QHS Pennelope Bracken, MD   200 mg at 08/01/18 2148  . sertraline (ZOLOFT) tablet 100 mg  100 mg Oral Daily Suella Broad, FNP   100 mg at 08/02/18 0853  . traZODone (DESYREL) tablet 100 mg  100 mg Oral QHS PRN Rankin, Shuvon B, NP   100 mg at 07/31/18 2146    Lab Results: No results found for this or any previous visit (from the past 48 hour(s)).  Blood Alcohol level:  Lab Results  Component Value Date   ETH <10 07/23/2018   ETH <5 59/56/3875    Metabolic  Disorder Labs: Lab Results  Component Value Date   HGBA1C 5.6 07/28/2018   MPG 114.02 07/28/2018   No results found for: PROLACTIN Lab Results  Component Value Date   CHOL 147 07/28/2018   TRIG 57 07/28/2018   HDL 41 07/28/2018   CHOLHDL 3.6 07/28/2018   VLDL 11 07/28/2018   LDLCALC 95 07/28/2018    Physical Findings: AIMS: Facial and Oral Movements Muscles of Facial Expression: None, normal Lips and Perioral Area: None, normal Jaw: None, normal Tongue: None, normal,Extremity Movements Upper (arms, wrists, hands, fingers): None, normal Lower (legs, knees, ankles, toes): None, normal, Trunk Movements Neck, shoulders, hips: None, normal, Overall Severity Severity of abnormal movements (highest score from questions above): None, normal Incapacitation due to abnormal movements: None, normal Patient's awareness of abnormal movements (rate only patient's report): No Awareness, Dental Status Current problems with teeth and/or dentures?: No Does patient usually wear dentures?: No  CIWA:    COWS:     Musculoskeletal: Strength & Muscle Tone: within normal limits Gait & Station: normal Patient leans: N/A  Psychiatric Specialty Exam: Physical Exam  Nursing note and vitals reviewed.   Review of Systems  Constitutional: Negative for chills and fever.  Respiratory: Negative for cough and shortness of breath.   Cardiovascular: Negative for chest pain.  Gastrointestinal: Negative for abdominal pain, heartburn, nausea and vomiting.  Psychiatric/Behavioral: Positive for depression and suicidal ideas. Negative for hallucinations. The patient is not nervous/anxious and does not have insomnia.     Blood pressure (!) 156/102, pulse 79, temperature 98 F (36.7 C), temperature source Oral, resp. rate 20, height 5\' 11"  (1.803 m), weight 86.2 kg, SpO2 100 %.Body mass index is 26.5 kg/m.  General Appearance: Casual and Fairly Groomed  Eye Contact:  Good  Speech:  Clear and Coherent and  Normal Rate  Volume:  Normal  Mood:  Depressed  Affect:  Congruent, Constricted and Depressed  Thought Process:  Coherent and Goal Directed  Orientation:  Full (Time, Place, and Person)  Thought Content:  Logical  Suicidal Thoughts:  Yes.  without intent/plan  Homicidal Thoughts:  No  Memory:  Immediate;   Fair Recent;   Fair Remote;   Fair  Judgement:  Fair  Insight:  Fair  Psychomotor Activity:  Normal  Concentration:  Concentration: Fair  Recall:  [employer] of Knowledge:  Fair  Language:  Fair  Akathisia:  No  Handed:    AIMS (if indicated):     Assets:  Resilience  ADL's:  Intact  Cognition:  WNL  Sleep:  Number of Hours: 6.75   Treatment Plan Summary: Daily contact with patient to assess and evaluate symptoms and progress in treatment and Medication management   -Continue inpatient hospitalization  -PTSD -Continue zoloft 100mg  po qDay -Continue seroquel 200mg  po qhs  -Anxiety -Continue vistaril 50mg  po q6h prn anxiety  -insomnia  -Continue Continue trazodone 100mg  po qhs prn insomnia  -asthma -Continue albuterol 108 mcg/ACT take 2 puffs inhaled q6h prn wheeze/SOB  -HTN -Continue amlodipine 10mg  po qDay -Continue clonidine 0.2mg  po BID -Continue furosemide 40mg  po qDay  -Encourage participation in groups and therapeutic milieu  -disposition planning will be ongoing  Pennelope Bracken, MD 08/02/2018, 9:06 AM

## 2018-08-02 NOTE — Progress Notes (Signed)
Recreation Therapy Notes  Date: 11.15.19 Time: 0930 Location: [address] Dayroom  Group Topic: Stress Management  Goal Area(s) Addresses:  Patient will verbalize importance of using healthy stress management.  Patient will identify positive emotions associated with healthy stress management.   Behavioral Response: Engaged  Intervention: Stress Management  Activity :  Guided Imagery.  LRT introduced the stress management technique of guided imagery.  LRT read a script that allowed patients to envision patients being on the beach enjoying the sounds of the waves.  Patients were to listen and follow along as script was read.  Education:  Stress Management, Discharge Planning.   Education Outcome: Acknowledges edcuation/In group clarification offered/Needs additional education  Clinical Observations/Feedback: Pt attended and participated in group.     Victorino Sparrow, LRT/CTRS         Ria Comment, Mariangel Ringley A 08/02/2018 11:09 AM

## 2018-08-03 DIAGNOSIS — G4700 Insomnia, unspecified: Secondary | ICD-10-CM

## 2018-08-03 DIAGNOSIS — I10 Essential (primary) hypertension: Secondary | ICD-10-CM

## 2018-08-03 NOTE — Progress Notes (Addendum)
[facility] MD Progress Note  08/03/2018 11:00 AM Gabriel Ashley  MRN:  [id number]   Subjective: Patient reports today that he does not feel much better than he did yesterday.  He is still reporting suicidal ideations without a plan.  He states that he depressed.  He denies any homicidal ideations or any hallucinations.  When questioned on his suicidal ideations and depression he states that he is worried about where his vehicle will be and if it is going to be impounded.  He states that not knowing if it will be towed from the hospital parking lot while he has gone worries him because all of his some belongings are in the vehicle.  He states that if he is sure that the vehicle would stay at the hospital why he was going to treatment than it would relieve a lot of his stress and worries and decrease his depression and suicidal ideations.  Patient reports that he did sleep better last night and he is feeling more energetic in the mornings when he gets up.  He states that he will be safe while he is on the unit.  Objective: Patient's chart and findings reviewed and discussed with treatment team.  Patient presents in the day room and is watching TV.  Patient has flat and depressed affect.  Patient has been seen interacting with peers and staff appropriately, but has been minimally.  Principal Problem: Severe recurrent major depression without psychotic features ([place]) Diagnosis:   Patient Active Problem List   Diagnosis Date Noted  . Cocaine dependence with cocaine-induced mood disorder ([place]) [F14.24]   . MDD (major depressive disorder), recurrent episode ([place]) [F33.9] 04/25/2017  . Acute renal failure (ARF) ([place]) [N17.9] 04/20/2017  . Hypertensive urgency [I16.0] 04/20/2017  . Acute congestive heart failure with left ventricular diastolic dysfunction ([place]) [I50.31] 12/04/2016  . Essential hypertension [I10]   . Substance abuse ([place]) [F19.10] 10/26/2014  . Substance induced mood disorder ([place]) [F19.94]   .  Major depressive disorder, recurrent, severe without psychotic features ([place]) [F33.2]   . Severe major depression ([place]) [F32.2] 10/23/2014  . PTSD (post-traumatic stress disorder) [F43.10]   . Essential hypertension, benign [I10] 05/11/2014  . Elevated serum creatinine [R79.89] 05/08/2014  . Depressive disorder [F32.9] 05/07/2014  . Suicidal ideation [R45.851] 05/07/2014  . Homicidal ideation [R45.850] 05/07/2014  . Anxiety [F41.9] 05/07/2014  . Severe recurrent major depression without psychotic features ([place]) [F33.2] 05/07/2014   Total Time spent with patient: 15 minutes  Past Psychiatric History: See H&P  Past Medical History:  Past Medical History:  Diagnosis Date  . Anemia   . Anxiety   . Asthma   . Cannabis use in remisison   . Cocaine use   . ED (erectile dysfunction)   . Hypertension   . Insomnia   . Kidney disease   . OSA (obstructive sleep apnea)   . Post traumatic stress disorder (PTSD)   . Vitamin D deficiency    History reviewed. No pertinent surgical history. Family History:  Family History  Problem Relation Age of Onset  . Breast cancer Mother   . Cancer Father        type unknown   Family Psychiatric  History: See H&P Social History:  Social History   Substance and Sexual Activity  Alcohol Use Not Currently   Comment: 2-3 years ago     Social History   Substance and Sexual Activity  Drug Use Yes  . Types: Cocaine, Marijuana    Social History  Socioeconomic History  . Marital status: Single    Spouse name: Not on file  . Number of children: 1  . Years of education: Not on file  . Highest education level: Not on file  Occupational History  . Not on file  Social Needs  . Financial resource strain: Not on file  . Food insecurity:    Worry: Not on file    Inability: Not on file  . Transportation needs:    Medical: Not on file    Non-medical: Not on file  Tobacco Use  . Smoking status: Former Smoker    Packs/day: 1.00    Years: 31.00     Pack years: 31.00    Last attempt to quit: 11/03/2017    Years since quitting: 0.7  . Smokeless tobacco: Never Used  Substance and Sexual Activity  . Alcohol use: Not Currently    Comment: 2-3 years ago  . Drug use: Yes    Types: Cocaine, Marijuana  . Sexual activity: Yes  Lifestyle  . Physical activity:    Days per week: Not on file    Minutes per session: Not on file  . Stress: Not on file  Relationships  . Social connections:    Talks on phone: Not on file    Gets together: Not on file    Attends religious service: Not on file    Active member of club or organization: Not on file    Attends meetings of clubs or organizations: Not on file    Relationship status: Not on file  Other Topics Concern  . Not on file  Social History Narrative  . Not on file   Additional Social History:    History of alcohol / drug use?: Yes                    Sleep: Good  Appetite:  Good  Current Medications: Current Facility-Administered Medications  Medication Dose Route Frequency Provider Last Rate Last Dose  . acetaminophen (TYLENOL) tablet 650 mg  650 mg Oral Q6H PRN Laverle Hobby, PA-C   650 mg at 08/02/18 2231  . albuterol (PROVENTIL HFA;VENTOLIN HFA) 108 (90 Base) MCG/ACT inhaler 2 puff  2 puff Inhalation Q6H PRN Rankin, Shuvon B, NP      . amLODipine (NORVASC) tablet 10 mg  10 mg Oral Daily Rankin, Shuvon B, NP   10 mg at 08/03/18 0852  . carvedilol (COREG) tablet 12.5 mg  12.5 mg Oral Daily Rankin, Shuvon B, NP   12.5 mg at 08/03/18 0852  . cloNIDine (CATAPRES) tablet 0.2 mg  0.2 mg Oral BID Rankin, Shuvon B, NP   0.2 mg at 08/03/18 0853  . furosemide (LASIX) tablet 40 mg  40 mg Oral Daily Rankin, Shuvon B, NP   40 mg at 08/03/18 0853  . hydrOXYzine (ATARAX/VISTARIL) tablet 50 mg  50 mg Oral Q8H PRN Pennelope Bracken, MD   50 mg at 08/02/18 2230  . multivitamin with minerals tablet 1 tablet  1 tablet Oral Daily Rankin, Shuvon B, NP   1 tablet at 08/03/18 0853   . QUEtiapine (SEROQUEL) tablet 200 mg  200 mg Oral QHS Maris Berger T, MD   200 mg at 08/02/18 2230  . sertraline (ZOLOFT) tablet 100 mg  100 mg Oral Daily Suella Broad, FNP   100 mg at 08/03/18 0853  . traZODone (DESYREL) tablet 100 mg  100 mg Oral QHS PRN Rankin, Shuvon B, NP   100 mg  at 07/31/18 2146    Lab Results: No results found for this or any previous visit (from the past 48 hour(s)).  Blood Alcohol level:  Lab Results  Component Value Date   ETH <10 07/23/2018   ETH <5 69/62/9528    Metabolic Disorder Labs: Lab Results  Component Value Date   HGBA1C 5.6 07/28/2018   MPG 114.02 07/28/2018   No results found for: PROLACTIN Lab Results  Component Value Date   CHOL 147 07/28/2018   TRIG 57 07/28/2018   HDL 41 07/28/2018   CHOLHDL 3.6 07/28/2018   VLDL 11 07/28/2018   LDLCALC 95 07/28/2018    Physical Findings: AIMS: Facial and Oral Movements Muscles of Facial Expression: None, normal Lips and Perioral Area: None, normal Jaw: None, normal Tongue: None, normal,Extremity Movements Upper (arms, wrists, hands, fingers): None, normal Lower (legs, knees, ankles, toes): None, normal, Trunk Movements Neck, shoulders, hips: None, normal, Overall Severity Severity of abnormal movements (highest score from questions above): None, normal Incapacitation due to abnormal movements: None, normal Patient's awareness of abnormal movements (rate only patient's report): No Awareness, Dental Status Current problems with teeth and/or dentures?: No Does patient usually wear dentures?: No  CIWA:    COWS:     Musculoskeletal: Strength & Muscle Tone: within normal limits Gait & Station: normal Patient leans: N/A  Psychiatric Specialty Exam: Physical Exam  Nursing note and vitals reviewed. Constitutional: He is oriented to person, place, and time. He appears well-developed and well-nourished.  Respiratory: Effort normal.  Musculoskeletal: Normal range of  motion.  Neurological: He is alert and oriented to person, place, and time.  Skin: Skin is warm.    Review of Systems  Constitutional: Negative.   HENT: Negative.   Eyes: Negative.   Respiratory: Negative.   Cardiovascular: Negative.   Gastrointestinal: Negative.   Genitourinary: Negative.   Musculoskeletal: Negative.   Skin: Negative.   Neurological: Negative.   Endo/Heme/Allergies: Negative.   Psychiatric/Behavioral: Positive for depression and suicidal ideas. The patient is nervous/anxious.     Blood pressure (!) 151/93, pulse (!) 55, temperature 98.2 F (36.8 C), resp. rate 20, height 5\' 11"  (1.803 m), weight 86.2 kg, SpO2 100 %.Body mass index is 26.5 kg/m.  General Appearance: Casual  Eye Contact:  Fair  Speech:  Clear and Coherent and Normal Rate  Volume:  Decreased  Mood:  Depressed  Affect:  Depressed and Flat  Thought Process:  Linear and Descriptions of Associations: Intact  Orientation:  Full (Time, Place, and Person)  Thought Content:  WDL  Suicidal Thoughts:  Yes.  without intent/plan  Homicidal Thoughts:  No  Memory:  Immediate;   Good Recent;   Good Remote;   Good  Judgement:  Fair  Insight:  Fair  Psychomotor Activity:  Normal  Concentration:  Concentration: Fair and Attention Span: Fair  Recall:  Good  Fund of Knowledge:  Good  Language:  Good  Akathisia:  No  Handed:  Right  AIMS (if indicated):     Assets:  Communication Skills Desire for Improvement Physical Health Transportation  ADL's:  Intact  Cognition:  WNL  Sleep:  Number of Hours: 6.75   Problems addressed PTSD MDD severe recurrent Cocaine dependence  Treatment Plan Summary: Daily contact with patient to assess and evaluate symptoms and progress in treatment, Medication management and Plan is to: Discussed with the [facility] about contacting security to check on patient's car and to verify the patient's car will still remain at [facility] in the parking  lot until he returns from  treatment.  AC reported that she will discuss it with the security at [facility] and will notify the patient. Continue Seroquel 200 mg p.o. nightly for mood stability Continue Zoloft 100 mg p.o. daily for mood stability Continue trazodone 100 mg p.o. nightly as needed for insomnia Continue Vistaril 50 mg p.o. every 8 hours as needed for anxiety Encourage group therapy participation Continue Norvasc 10 mg daily, Coreg 12.5 mg daily, and clonidine 0.2 mg twice daily and Lasix 40 mg p.o. daily for hypertension  Lewis Shock, FNP 08/03/2018, 11:00 AM    ..Agree with NP Progress Note

## 2018-08-03 NOTE — [facility] Group Notes (Signed)
LCSW Group Therapy Note  08/03/2018    10:45-11:30am   Type of Therapy and Topic:  Group Therapy: Early Messages Received About Anger  Participation Level:  Minimal   Description of Group:   In this group, patients shared and discussed the early messages received in their lives about anger through parental or other adult modeling, teaching, repression, punishment, violence, and more.  Participants identified how those childhood lessons influence even now how they usually or often react when angered.  The group discussed that anger is a secondary emotion and what may be the underlying emotional themes that come out through anger outbursts or that are ignored through anger suppression.  Finally, as a group there was a conversation about the workbook's quote that "There is nothing wrong with anger; it is just a sign something needs to change."     Therapeutic Goals: 1. Patients will identify one or more childhood message about anger that they received and how it was taught to them. 2. Patients will discuss how these childhood experiences have influenced and continue to influence their own expression or repression of anger even today. 3. Patients will explore possible primary emotions that tend to fuel their secondary emotion of anger. 4. Patients will learn that anger itself is normal and cannot be eliminated, and that healthier coping skills can assist with resolving conflict rather than worsening situations.    Therapeutic Modalities:   Cognitive Behavioral Therapy Motivation Interviewing Summary of Patient Progress:  The patient was present throughout the entire group but was not attentive, seemed to be resting with his eyes closed.  When it was his turn to share as we were going around the room, he did not even know the question being asked "what were the early messages you received about anger?"  He said he would have to think about it.  He continues to not engage for the remainder of  group.  Gabriel Ashley  .

## 2018-08-03 NOTE — Progress Notes (Signed)
D.  Pt pleasant on approach, no complaints voiced. Pt did deny SI/HI/AVH at this time but reported some passive SI on day shift.  Pt was positive for evening AA group, observed engaged in appropriate interaction with peers on the unit.  A.  Support and encouragement offered, medication given as ordered  R. Pt remains safe on the unit, will continue to monitor.

## 2018-08-03 NOTE — Progress Notes (Addendum)
D. Pt presents with a flat affect/ depressed mood and calm, cooperative behavior. Pt observed interacting well with peers in the dayroom and attending group. Pt continues to express concern that he "won't be able to beat the addiction".  Pt endorses passive SI without a plan and agrees to contact staff before acting on any harmful thoughts.  A. Labs and vitals monitored. Pt compliant with medications. Pt supported emotionally and encouraged to express concerns and ask questions.   R. Pt remains safe with 15 minute checks. Will continue POC.

## 2018-08-03 NOTE — [facility] Group Notes (Addendum)
[facility] Group Notes:  (Nursing)  Date:  08/03/2018  Time: 130 PM Type of Therapy:  Nurse Education  Participation Level:  Active  Participation Quality:  Appropriate  Affect:  Appropriate  Cognitive:  Appropriate  Insight:  Appropriate  Engagement in Group:  Engaged  Modes of Intervention:  Discussion and Education  Summary of Progress/Problems:  Nurse led group played a noncompetitive learning/communication board game that fosters listening skills as well as self expression.  Waymond Cera 08/03/2018, 3:05 PM

## 2018-08-04 NOTE — Progress Notes (Signed)
D. Pt presents with a flat affect/depressed mood- calm and cooperative- visible in the milieu- interacts appropriately with peers. Pt continues to endorse passive SI and agrees to contact staff before acting on any harmful thoughts.  A. Labs and vitals monitored. Pt compliant with medications. Pt supported emotionally and encouraged to express concerns and ask questions.   R. Pt remains safe with 15 minute checks. Will continue POC.

## 2018-08-04 NOTE — Progress Notes (Addendum)
[facility] MD Progress Note  08/04/2018 10:02 AM Gabriel Ashley  MRN:  [id number]   Subjective: Patient states today that he feels the same as he did yesterday.  He reports that he is still concerned about his car and that makes him feel depressed and suicidal thoughts if he loses everything.  He states that no one ever came back to him yesterday to inform him of what they were going to do about his car at the hospital.  He does deny any homicidal ideations and denies any hallucinations.  Patient is concerned that his car will be towed with all of his belongings and that would make things worse for him.  He states that he is still good to go to [facility] on Tuesday.  He just wants to have an answer about his car.  He denies any withdrawal symptoms and reports that he is sleeping good and has a good appetite.  Objective: Patient's chart and findings reviewed and discussed with treatment team.  Patient presents in his bed lying down but is awake.  Patient is calm, cooperative, and pleasant.  I do go discuss the issue with security again today.  Security contacts [facility] and then informed me that they will not to the patient's car and that it is something they used to with having the patient did leave a car in a parking lot if they drove themselves to the hospital.  Martin Majestic to inform the patient but he was in group and requested the nurse to notify the patient that his car will stay at [facility] until he was finished with treatment.  This is intention of reducing patient's stress and depression so that he does not report any further SI so he can continue with his treatment.  Principal Problem: Severe recurrent major depression without psychotic features ([place]) Diagnosis:   Patient Active Problem List   Diagnosis Date Noted  . Cocaine dependence with cocaine-induced mood disorder ([place]) [F14.24]   . MDD (major depressive disorder), recurrent episode ([place]) [F33.9] 04/25/2017  . Acute renal failure (ARF)  ([place]) [N17.9] 04/20/2017  . Hypertensive urgency [I16.0] 04/20/2017  . Acute congestive heart failure with left ventricular diastolic dysfunction ([place]) [I50.31] 12/04/2016  . Essential hypertension [I10]   . Substance abuse ([place]) [F19.10] 10/26/2014  . Substance induced mood disorder ([place]) [F19.94]   . Major depressive disorder, recurrent, severe without psychotic features ([place]) [F33.2]   . Severe major depression ([place]) [F32.2] 10/23/2014  . PTSD (post-traumatic stress disorder) [F43.10]   . Essential hypertension, benign [I10] 05/11/2014  . Elevated serum creatinine [R79.89] 05/08/2014  . Depressive disorder [F32.9] 05/07/2014  . Suicidal ideation [R45.851] 05/07/2014  . Homicidal ideation [R45.850] 05/07/2014  . Anxiety [F41.9] 05/07/2014  . Severe recurrent major depression without psychotic features ([place]) [F33.2] 05/07/2014   Total Time spent with patient: 15 minutes  Past Psychiatric History: See H&P  Past Medical History:  Past Medical History:  Diagnosis Date  . Anemia   . Anxiety   . Asthma   . Cannabis use in remisison   . Cocaine use   . ED (erectile dysfunction)   . Hypertension   . Insomnia   . Kidney disease   . OSA (obstructive sleep apnea)   . Post traumatic stress disorder (PTSD)   . Vitamin D deficiency    History reviewed. No pertinent surgical history. Family History:  Family History  Problem Relation Age of Onset  . Breast cancer Mother   . Cancer Father  type unknown   Family Psychiatric  History: See H&P Social History:  Social History   Substance and Sexual Activity  Alcohol Use Not Currently   Comment: 2-3 years ago     Social History   Substance and Sexual Activity  Drug Use Yes  . Types: Cocaine, Marijuana    Social History   Socioeconomic History  . Marital status: Single    Spouse name: Not on file  . Number of children: 1  . Years of education: Not on file  . Highest education level: Not on file  Occupational History   . Not on file  Social Needs  . Financial resource strain: Not on file  . Food insecurity:    Worry: Not on file    Inability: Not on file  . Transportation needs:    Medical: Not on file    Non-medical: Not on file  Tobacco Use  . Smoking status: Former Smoker    Packs/day: 1.00    Years: 31.00    Pack years: 31.00    Last attempt to quit: 11/03/2017    Years since quitting: 0.7  . Smokeless tobacco: Never Used  Substance and Sexual Activity  . Alcohol use: Not Currently    Comment: 2-3 years ago  . Drug use: Yes    Types: Cocaine, Marijuana  . Sexual activity: Yes  Lifestyle  . Physical activity:    Days per week: Not on file    Minutes per session: Not on file  . Stress: Not on file  Relationships  . Social connections:    Talks on phone: Not on file    Gets together: Not on file    Attends religious service: Not on file    Active member of club or organization: Not on file    Attends meetings of clubs or organizations: Not on file    Relationship status: Not on file  Other Topics Concern  . Not on file  Social History Narrative  . Not on file   Additional Social History:    History of alcohol / drug use?: Yes                    Sleep: Good  Appetite:  Good  Current Medications: Current Facility-Administered Medications  Medication Dose Route Frequency Provider Last Rate Last Dose  . acetaminophen (TYLENOL) tablet 650 mg  650 mg Oral Q6H PRN Laverle Hobby, PA-C   650 mg at 08/04/18 0843  . albuterol (PROVENTIL HFA;VENTOLIN HFA) 108 (90 Base) MCG/ACT inhaler 2 puff  2 puff Inhalation Q6H PRN Rankin, Shuvon B, NP      . amLODipine (NORVASC) tablet 10 mg  10 mg Oral Daily Rankin, Shuvon B, NP   10 mg at 08/04/18 0840  . carvedilol (COREG) tablet 12.5 mg  12.5 mg Oral Daily Rankin, Shuvon B, NP   12.5 mg at 08/04/18 0840  . cloNIDine (CATAPRES) tablet 0.2 mg  0.2 mg Oral BID Rankin, Shuvon B, NP   0.2 mg at 08/04/18 0840  . furosemide (LASIX) tablet  40 mg  40 mg Oral Daily Rankin, Shuvon B, NP   40 mg at 08/04/18 0840  . hydrOXYzine (ATARAX/VISTARIL) tablet 50 mg  50 mg Oral Q8H PRN Pennelope Bracken, MD   50 mg at 08/03/18 2233  . multivitamin with minerals tablet 1 tablet  1 tablet Oral Daily Rankin, Shuvon B, NP   1 tablet at 08/04/18 0840  . QUEtiapine (SEROQUEL) tablet 200 mg  200 mg Oral QHS Pennelope Bracken, MD   200 mg at 08/03/18 2233  . sertraline (ZOLOFT) tablet 100 mg  100 mg Oral Daily Suella Broad, FNP   100 mg at 08/04/18 0840  . traZODone (DESYREL) tablet 100 mg  100 mg Oral QHS PRN Rankin, Shuvon B, NP   100 mg at 07/31/18 2146    Lab Results: No results found for this or any previous visit (from the past 48 hour(s)).  Blood Alcohol level:  Lab Results  Component Value Date   ETH <10 07/23/2018   ETH <5 75/91/6384    Metabolic Disorder Labs: Lab Results  Component Value Date   HGBA1C 5.6 07/28/2018   MPG 114.02 07/28/2018   No results found for: PROLACTIN Lab Results  Component Value Date   CHOL 147 07/28/2018   TRIG 57 07/28/2018   HDL 41 07/28/2018   CHOLHDL 3.6 07/28/2018   VLDL 11 07/28/2018   LDLCALC 95 07/28/2018    Physical Findings: AIMS: Facial and Oral Movements Muscles of Facial Expression: None, normal Lips and Perioral Area: None, normal Jaw: None, normal Tongue: None, normal,Extremity Movements Upper (arms, wrists, hands, fingers): None, normal Lower (legs, knees, ankles, toes): None, normal, Trunk Movements Neck, shoulders, hips: None, normal, Overall Severity Severity of abnormal movements (highest score from questions above): None, normal Incapacitation due to abnormal movements: None, normal Patient's awareness of abnormal movements (rate only patient's report): No Awareness, Dental Status Current problems with teeth and/or dentures?: No Does patient usually wear dentures?: No  CIWA:    COWS:     Musculoskeletal: Strength & Muscle Tone: within normal  limits Gait & Station: normal Patient leans: N/A  Psychiatric Specialty Exam: Physical Exam  Nursing note and vitals reviewed. Constitutional: He is oriented to person, place, and time. He appears well-developed and well-nourished.  Respiratory: Effort normal.  Musculoskeletal: Normal range of motion.  Neurological: He is alert and oriented to person, place, and time.  Skin: Skin is warm.    Review of Systems  Constitutional: Negative.   HENT: Negative.   Eyes: Negative.   Respiratory: Negative.   Cardiovascular: Negative.   Gastrointestinal: Negative.   Genitourinary: Negative.   Musculoskeletal: Negative.   Skin: Negative.   Neurological: Negative.   Endo/Heme/Allergies: Negative.   Psychiatric/Behavioral: Positive for depression and suicidal ideas. The patient is nervous/anxious.     Blood pressure (!) 153/101, pulse 62, temperature 98.2 F (36.8 C), temperature source Oral, resp. rate 20, height 5\' 11"  (1.803 m), weight 86.2 kg, SpO2 100 %.Body mass index is 26.5 kg/m.  General Appearance: Casual  Eye Contact:  Fair  Speech:  Clear and Coherent and Normal Rate  Volume:  Decreased  Mood:  Depressed  Affect:  Depressed and Flat  Thought Process:  Linear and Descriptions of Associations: Intact  Orientation:  Full (Time, Place, and Person)  Thought Content:  WDL  Suicidal Thoughts:  Yes.  without intent/plan  Homicidal Thoughts:  No  Memory:  Immediate;   Good Recent;   Good Remote;   Good  Judgement:  Fair  Insight:  Fair  Psychomotor Activity:  Normal  Concentration:  Concentration: Fair and Attention Span: Fair  Recall:  Good  Fund of Knowledge:  Good  Language:  Good  Akathisia:  No  Handed:  Right  AIMS (if indicated):     Assets:  Communication Skills Desire for Improvement Physical Health Transportation  ADL's:  Intact  Cognition:  WNL  Sleep:  Number of Hours:  6.75   Problems addressed PTSD MDD severe recurrent Cocaine dependence  Treatment  Plan Summary: Daily contact with patient to assess and evaluate symptoms and progress in treatment, Medication management and Plan is to: Continue Seroquel 200 mg p.o. nightly for mood stability Continue Zoloft 100 mg p.o. daily for mood stability Continue trazodone 100 mg p.o. nightly as needed for insomnia Continue Vistaril 50 mg p.o. every 8 hours as needed for anxiety Encourage group therapy participation Continue Norvasc 10 mg daily Coreg 12.5 mg daily, and clonidine 0.2 mg twice daily Lasix 40 mg p.o. daily for hypertension  Lewis Shock, FNP 08/04/2018, 10:02 AM   ..Agree with NP Progress Note

## 2018-08-04 NOTE — [facility] Group Notes (Addendum)
[place] [facility] Group Notes:  (Nursing/MHT/Case Management/Adjunct)  Date:  08/04/2018  Time:  4:46 PM  Type of Therapy:  Nurse Education  Participation Level:  Patient attended and participated, he was appropriate and attentive.  Summary of Progress/Problems: This group focused on [place] 5 love languages. We discussed definitions of each of the different love languages. We also covered healthy boundaries in our relationships. The group was given homework to take a quiz and find out what their love language is and apply that to their relationships.  Lesli Albee 08/04/2018, 4:46 PM

## 2018-08-04 NOTE — Progress Notes (Signed)
D.  Pt pleasant on approach, flat affect, denies complaints.   Pt was positive for evening AA group, has been observed in dayroom watching movie with peers.  Pt has had minimal interaction this evening.  Pt denies SI/HI/AVH at this time.  A.  Support and encouragement offered, medication given as ordered  R.  Pt remains safe on the unit, will continue to monitor.

## 2018-08-04 NOTE — [facility] Group Notes (Signed)
[place] [facility] LCSW Group Therapy Note  08/04/2018  10:00-11:00AM  Type of Therapy and Topic:  Group Therapy:  Adding Supports Including Being Your Own Support  Participation Level:  Active   Description of Group:  Patients in this group were introduced to the concept that additional supports including self-support are an essential part of recovery.  A song entitled "I Need Help!" was played and a group discussion was held in reaction to the idea of needing to add supports.  A song entitled "My Own Hero" was played and a group discussion ensued in which patients stated they could relate to the song and it inspired them to realize they have be willing to help themselves in order to succeed, because other people cannot achieve sobriety or stability for them.  We discussed adding a variety of healthy supports to address the various needs in their lives.    Therapeutic Goals: 1)  demonstrate the importance of being a part of one's own support system 2)  discuss reasons people in one's life may eventually be unable to be continually supportive  3)  identify the patient's current support system and   4)  elicit commitments to add healthy supports and to become more conscious of being self-supportive   Summary of Patient Progress:  The patient expressed that he wants to go back to Narcotics Anonymous and build a new network, get a new sponsor.   He spoke little in group but remained engaged in actively listening.  Therapeutic Modalities:   Motivational Interviewing Activity  Maretta Los

## 2018-08-04 NOTE — Progress Notes (Signed)
Patient did attend the evening speaker AA meeting.  

## 2018-08-05 MED ORDER — CARVEDILOL 12.5 MG PO TABS: 12.5000 mg | ORAL_TABLET | Freq: Every day | ORAL | 0 refills | Status: AC

## 2018-08-05 MED ORDER — AMLODIPINE BESYLATE 10 MG PO TABS: 10.0000 mg | ORAL_TABLET | Freq: Every day | ORAL | 0 refills | Status: AC

## 2018-08-05 MED ORDER — SERTRALINE HCL 100 MG PO TABS: 100.0000 mg | ORAL_TABLET | Freq: Every day | ORAL | 0 refills | Status: AC

## 2018-08-05 MED ORDER — CLONIDINE HCL 0.2 MG PO TABS: 0.2000 mg | ORAL_TABLET | Freq: Two times a day (BID) | ORAL | 0 refills | Status: AC

## 2018-08-05 MED ORDER — HYDROXYZINE HCL 50 MG PO TABS: 50.0000 mg | ORAL_TABLET | Freq: Three times a day (TID) | ORAL | 0 refills | Status: DC | PRN

## 2018-08-05 MED ORDER — FUROSEMIDE 40 MG PO TABS: 40.0000 mg | ORAL_TABLET | Freq: Every day | ORAL | 0 refills | Status: AC

## 2018-08-05 MED ORDER — QUETIAPINE FUMARATE 200 MG PO TABS: 200.0000 mg | ORAL_TABLET | Freq: Every day | ORAL | 0 refills | Status: DC

## 2018-08-05 MED ORDER — ALBUTEROL SULFATE HFA 108 (90 BASE) MCG/ACT IN AERS: 2.0000 | INHALATION_SPRAY | Freq: Four times a day (QID) | RESPIRATORY_TRACT | 0 refills | Status: AC | PRN

## 2018-08-05 NOTE — Progress Notes (Signed)
D    Pt reports being ready for discharge tomorrow and looking forward to ongoing treatment and [facility]    He reports improved mood and outlook on life    A   Verbal support and encouragement given    Medications administered and effectiveness monitored    Q 15 min checks  R   Pt is safe at this time

## 2018-08-05 NOTE — Tx Team (Signed)
Interdisciplinary Treatment and Diagnostic Plan Update  08/05/2018 Time of Session:  0830AM Gabriel Ashley MRN: [id number]  Principal Diagnosis: MDD, recurrent, severe, without psychosis  Secondary Diagnoses: Principal Problem:   Severe recurrent major depression without psychotic features ([name]) Active Problems:   PTSD (post-traumatic stress disorder)   Cocaine dependence with cocaine-induced mood disorder ([name])   Current Medications:  Current Facility-Administered Medications  Medication Dose Route Frequency Provider Last Rate Last Dose  . acetaminophen (TYLENOL) tablet 650 mg  650 mg Oral Q6H PRN Laverle Hobby, PA-C   650 mg at 08/04/18 0843  . albuterol (PROVENTIL HFA;VENTOLIN HFA) 108 (90 Base) MCG/ACT inhaler 2 puff  2 puff Inhalation Q6H PRN Rankin, Shuvon B, NP      . amLODipine (NORVASC) tablet 10 mg  10 mg Oral Daily Rankin, Shuvon B, NP   10 mg at 08/05/18 0808  . carvedilol (COREG) tablet 12.5 mg  12.5 mg Oral Daily Rankin, Shuvon B, NP   12.5 mg at 08/05/18 0807  . cloNIDine (CATAPRES) tablet 0.2 mg  0.2 mg Oral BID Rankin, Shuvon B, NP   0.2 mg at 08/05/18 0808  . furosemide (LASIX) tablet 40 mg  40 mg Oral Daily Rankin, Shuvon B, NP   40 mg at 08/05/18 0808  . hydrOXYzine (ATARAX/VISTARIL) tablet 50 mg  50 mg Oral Q8H PRN Pennelope Bracken, MD   50 mg at 08/04/18 2130  . multivitamin with minerals tablet 1 tablet  1 tablet Oral Daily Rankin, Shuvon B, NP   1 tablet at 08/05/18 0807  . QUEtiapine (SEROQUEL) tablet 200 mg  200 mg Oral QHS Pennelope Bracken, MD   200 mg at 08/04/18 2130  . sertraline (ZOLOFT) tablet 100 mg  100 mg Oral Daily Suella Broad, FNP   100 mg at 08/05/18 9323  . traZODone (DESYREL) tablet 100 mg  100 mg Oral QHS PRN Rankin, Shuvon B, NP   100 mg at 07/31/18 2146   PTA Medications: Medications Prior to Admission  Medication Sig Dispense Refill Last Dose  . albuterol (PROVENTIL HFA;VENTOLIN HFA) 108 (90 Base) MCG/ACT inhaler  Inhale 2 puffs into the lungs every 6 (six) hours as needed for wheezing or shortness of breath.   Past Week at Unknown time  . amLODipine (NORVASC) 10 MG tablet Take 1 tablet (10 mg total) by mouth daily. 30 tablet 0 Past Week at Unknown time  . busPIRone (BUSPAR) 5 MG tablet Take 5 mg by mouth 2 (two) times daily.   Past Week at Unknown time  . carvedilol (COREG) 12.5 MG tablet Take 12.5 mg by mouth daily.   Past Week at Unknown time  . cloNIDine (CATAPRES) 0.2 MG tablet Take 1 tablet (0.2 mg total) by mouth 2 (two) times daily. 60 tablet 0 Past Week at Unknown time  . FLUoxetine (PROZAC) 20 MG capsule Take 20 mg by mouth daily.   Past Week at Unknown time  . furosemide (LASIX) 40 MG tablet Take 40 mg by mouth daily.   Past Week at Unknown time  . hydrOXYzine (VISTARIL) 25 MG capsule Take 25 mg by mouth every 8 (eight) hours as needed for anxiety.    Past Week at Unknown time  . Multiple Vitamin (MULTIVITAMIN) tablet Take 1 tablet by mouth daily.   Past Week at Unknown time  . sildenafil (VIAGRA) 100 MG tablet Take 100 mg by mouth as needed for erectile dysfunction.   11 Past Month at Unknown time  . traZODone (DESYREL) 100 MG  tablet Take 100 mg by mouth at bedtime as needed for sleep.   Past Week at Unknown time    Treatment Modalities: Medication Management, Group therapy, Case management,  1 to 1 session with clinician, Psychoeducation, Recreational therapy.   Physician Treatment Plan for Primary Diagnosis: MDD, recurrent, severe, without psychosis Medication Management: Evaluate patient's response, side effects, and tolerance of medication regimen.  Therapeutic Interventions: 1 to 1 sessions, Unit Group sessions and Medication administration.  Evaluation of Outcomes: Progressing   Physician Treatment Plan for Secondary Diagnosis: Principal Problem:   Severe recurrent major depression without psychotic features ([name]) Active Problems:   PTSD (post-traumatic stress disorder)   Cocaine  dependence with cocaine-induced mood disorder ([name])   Medication Management: Evaluate patient's response, side effects, and tolerance of medication regimen.  Therapeutic Interventions: 1 to 1 sessions, Unit Group sessions and Medication administration.  Evaluation of Outcomes: Progressing  RN Treatment Plan for Primary Diagnosis:MDD, recurrent, severe, without psychosis Long Term Goal(s): Knowledge of disease and therapeutic regimen to maintain health will improve  Short Term Goals: Ability to remain free from injury will improve, Ability to disclose and discuss suicidal ideas and Ability to identify and develop effective coping behaviors will improve  Medication Management: RN will administer medications as ordered by provider, will assess and evaluate patient's response and provide education to patient for prescribed medication. RN will report any adverse and/or side effects to prescribing provider.  Therapeutic Interventions: 1 on 1 counseling sessions, Psychoeducation, Medication administration, Evaluate responses to treatment, Monitor vital signs and CBGs as ordered, Perform/monitor CIWA, COWS, AIMS and Fall Risk screenings as ordered, Perform wound care treatments as ordered.  Evaluation of Outcomes: Progressing  LCSW Treatment Plan for Primary Diagnosis: Severe recurrent major depression without psychotic features ([name]) Long Term Goal(s): Safe transition to appropriate next level of care at discharge, Engage patient in therapeutic group addressing interpersonal concerns.  Short Term Goals: Engage patient in aftercare planning with referrals and resources, Increase emotional regulation, Facilitate patient progression through stages of change regarding substance use diagnoses and concerns and Identify triggers associated with mental health/substance abuse issues  Therapeutic Interventions: Assess for all discharge needs, 1 to 1 time with Social worker, Explore available resources and  support systems, Assess for adequacy in community support network, Educate family and significant other(s) on suicide prevention, Complete Psychosocial Assessment, Interpersonal group therapy.  Evaluation of Outcomes: Progressing  Progress in Treatment: Attending groups: Yes Participating in groups: Yes Taking medication as prescribed: Yes. Toleration medication: Yes. Family/Significant other contact made: SPE completed with pt; pt declined to consent to family contact.  Patient understands diagnosis: Yes. Discussing patient identified problems/goals with staff: Yes. Medical problems stabilized or resolved: Yes. Denies suicidal/homicidal ideation: No. passive SI/able to contract for safety. Continues to endorse SI thoughts.  Issues/concerns per patient self-inventory: no Other: n/a  New problem(s) identified: No, Describe:  n/a  New Short Term/Long Term Goal(s): detox, medication management for mood stabilization; elimination of SI thoughts; development of comprehensive mental wellness/sobriety plan.   Patient Goals:  "I need to get back on meds for PTSD and depression."  Discharge Plan or Barriers: Pt must attend face to face assessment at [facility] with substance abuse counselor in order to be approved for a referral to residential treatment through the [place]. Hospital follow-up appt with Mental Health Psychiatrist at the [facility]. Pt has been offered a [facility] referral and declined. Pt has been offered an [facility] referral and requested a day to think about this  option. CSW continuing to assess. [place] pamphlet, Mobile Crisis information, and AA/NA information provided to patient for additional community support and resources.   Reason for Continuation of Hospitalization: Depression Medication stabilization Suicidal ideation  Estimated Length of Stay: Friday, 08/02/18  Attendees: Patient: 08/05/2018 1:24 PM  Physician: Dr. Nancy Fetter MD 08/05/2018 1:24  PM  Nursing: Estill Bamberg RN; Chrys Racer RN; Elberta Fortis.A, RN 08/05/2018 1:24 PM  RN Care Manager:x 08/05/2018 1:24 PM  Social Worker: Janice Norrie LCSW; Radonna Ricker, [place] 08/05/2018 1:24 PM  Recreational Therapist: x 08/05/2018 1:24 PM  Other: Lindell Spar NP 08/05/2018 1:24 PM  Other:  08/05/2018 1:24 PM  Other: 08/05/2018 1:24 PM    Scribe for Treatment Team: Marylee Floras, [place] 08/05/2018 1:24 PM

## 2018-08-05 NOTE — Progress Notes (Signed)
Recreation Therapy Notes  Date: 11.18.19 Time: 0930 Location: 300 [address] Dayroom  Group Topic: Stress Management  Goal Area(s) Addresses:  Patient will verbalize importance of using healthy stress management.  Patient will identify positive emotions associated with healthy stress management.   Intervention: Stress Management  Activity :  Guided Imagery.  LRT introduced the stress management technique of guided imagery.  LRT read a script that took patients on a journey through the forest.  Patients were to listen as the script was read to engage in the activity.  Education:  Stress Management, Discharge Planning.   Education Outcome: Acknowledges edcuation/In group clarification offered/Needs additional education  Clinical Observations/Feedback: Pt did not attend group.     Victorino Sparrow,  LRT/CTRS         Victorino Sparrow A 08/05/2018 11:24 AM

## 2018-08-05 NOTE — Discharge Summary (Signed)
Physician Discharge Summary Note  Patient:  Gabriel Ashley is an 52 y.o., male MRN:  [id number] DOB:  1965/10/15 Patient phone:  [phone] (home)  Patient address:   [address] [place] [address],  Total Time spent with patient: 30 minutes  Date of Admission:  07/25/2018 Date of Discharge: 08/05/18  Reason for Admission:  Depression, SI, cocaine use  Principal Problem: Severe recurrent major depression without psychotic features [facility]) Discharge Diagnoses: Principal Problem:   Severe recurrent major depression without psychotic features ([place]) Active Problems:   PTSD (post-traumatic stress disorder)   Cocaine dependence with cocaine-induced mood disorder ([place])   Past Psychiatric History: see H&P  Past Medical History:  Past Medical History:  Diagnosis Date  . Anemia   . Anxiety   . Asthma   . Cannabis use in remisison   . Cocaine use   . ED (erectile dysfunction)   . Hypertension   . Insomnia   . Kidney disease   . OSA (obstructive sleep apnea)   . Post traumatic stress disorder (PTSD)   . Vitamin D deficiency    History reviewed. No pertinent surgical history. Family History:  Family History  Problem Relation Age of Onset  . Breast cancer Mother   . Cancer Father        type unknown   Family Psychiatric  History: see H&P Social History:  Social History   Substance and Sexual Activity  Alcohol Use Not Currently   Comment: 2-3 years ago     Social History   Substance and Sexual Activity  Drug Use Yes  . Types: Cocaine, Marijuana    Social History   Socioeconomic History  . Marital status: Single    Spouse name: Not on file  . Number of children: 1  . Years of education: Not on file  . Highest education level: Not on file  Occupational History  . Not on file  Social Needs  . Financial resource strain: Not on file  . Food insecurity:    Worry: Not on file    Inability: Not on file  . Transportation needs:    Medical: Not on file     Non-medical: Not on file  Tobacco Use  . Smoking status: Former Smoker    Packs/day: 1.00    Years: 31.00    Pack years: 31.00    Last attempt to quit: 11/03/2017    Years since quitting: 0.7  . Smokeless tobacco: Never Used  Substance and Sexual Activity  . Alcohol use: Not Currently    Comment: 2-3 years ago  . Drug use: Yes    Types: Cocaine, Marijuana  . Sexual activity: Yes  Lifestyle  . Physical activity:    Days per week: Not on file    Minutes per session: Not on file  . Stress: Not on file  Relationships  . Social connections:    Talks on phone: Not on file    Gets together: Not on file    Attends religious service: Not on file    Active member of club or organization: Not on file    Attends meetings of clubs or organizations: Not on file    Relationship status: Not on file  Other Topics Concern  . Not on file  Social History Narrative  . Not on file    Hospital Course:    History as per psychiatric intake: Gabriel Ashley is a 52 y/o M with history of MDD, PTSD, and cocaine use disorder who  was admitted voluntarily from [facility] where he presented with worsening depression, SI with plan, and worsening use of crack cocaine. He also reported being off of his psychotropic medications for several months. He was medically cleared and then transferred to [facility] for additional treatment and stabilization. Upon evaluation, pt was seen in the office with SW team present. Pt has significantly depressed affect, somewhat increased latency of responses, poor eye contact, and low speech. His responses are somewhat minimal and vague at times, limiting his ability as a historian. However, he is generally cooperative with the interview. Pt shares, "I've just been feeling suicidal." He identifies plan of walking into traffic. He identifies main stressor as struggling with use of crack cocaine. When asked about cocaine use, pt is guarded and he does not share exactly how much he has been using  aside from reporting that he uses "all day" multiple times per day. He denies HI/AH/VH. He endorses depressive symptoms of initial insomnia, anhedonia, guilty feelings, low energy, and poor concentration. He denies symptoms of mania/hypomania and OCD. He endorses PTSD symptoms of flashbacks, nightmares, hypervigilance, hyperarousal, and avoidance. He smokes 1ppd and he denies alcohol use; he denies other illicit substance use. Discussed with patient about treatment options. He has previous trials of prozac, buspar, and lexaparo, and he reports these medications have not been helpful. He is in agreement to trial of zoloft, vistaril, and trazodone. He is in agreement to be referred to substance use treatment, and he voices preference to be referred to the [place]. Pt was in agreement with the above plan, and he had no further questions, comments, or concerns.  As per evaluation today: Today upon evaluation, pt shares, "I'm not too good." He reports ongoing depression, anxiety, and SI without specific plan at this point. He describes anxiety regarding being unable to pay for his car payment and his car currently contains all of his belongings. He denies HI/AH/VH. He is tolerating his medications well, and he is in agreement to continue his current regimen without changes. Pt remains in agreement to be discharged directly to [place] for substance use treatment. As he feels unable to maintain his safety outside of the structured setting, we will place IVC at this time, and pt will be transported to [place] tomorrow AM. Pt will follow up at the [facility] after completion of substance use treatment at [place]. He was able to engage in safety planning regarding this disposition plan including contigency to return to [facility] or contact emergency services if he feels unable to maintain his own safety or the safety of others outside of setting of [facility]. Pt had no further questions, comments, or concerns.   The patient is at low risk of  imminent suicide. Patient denied thoughts, intent, or plan for harm to self or others, expressed significant future orientation, and expressed an ability to mobilize assistance for his needs. He is presently void of any contributing psychiatric symptoms, cognitive difficulties, or substance use which would elevate his risk for lethality. Chronic risk for lethality is elevated in light of poor social support, poor adherence, and impulsivity. The chronic risk is presently mitigated by his ongoing desire and engagement in [facility] treatment and mobilization of support from family and friends. Chronic risk may elevate if he experiences any significant loss or worsening of symptoms, which can be managed and monitored through outpatient providers. At this time, acute risk for lethality is low and he is stable for ongoing outpatient management.    Modifiable risk factors were addressed  during this hospitalization through appropriate pharmacotherapy and establishment of outpatient follow-up treatment. Some risk factors for suicide are situational (i.e. Unstable social support) or related personality pathology (i.e. Poor coping mechanisms) and thus cannot be further mitigated by continued hospitalization in this setting.   Physical Findings: AIMS: Facial and Oral Movements Muscles of Facial Expression: None, normal Lips and Perioral Area: None, normal Jaw: None, normal Tongue: None, normal,Extremity Movements Upper (arms, wrists, hands, fingers): None, normal Lower (legs, knees, ankles, toes): None, normal, Trunk Movements Neck, shoulders, hips: None, normal, Overall Severity Severity of abnormal movements (highest score from questions above): None, normal Incapacitation due to abnormal movements: None, normal Patient's awareness of abnormal movements (rate only patient's report): No Awareness, Dental Status Current problems with teeth and/or dentures?: No Does patient usually wear dentures?: No  CIWA:    COWS:      Musculoskeletal: Strength & Muscle Tone: within normal limits Gait & Station: normal Patient leans: N/A  Psychiatric Specialty Exam: Physical Exam  Nursing note and vitals reviewed.   Review of Systems  Constitutional: Negative for chills and fever.  Respiratory: Negative for cough and shortness of breath.   Cardiovascular: Negative for chest pain.  Gastrointestinal: Negative for abdominal pain, heartburn, nausea and vomiting.  Psychiatric/Behavioral: Negative for depression, hallucinations and suicidal ideas. The patient is not nervous/anxious and does not have insomnia.     Blood pressure (!) 138/111, pulse 61, temperature 98.2 F (36.8 C), temperature source Oral, resp. rate 20, height 5\' 11"  (1.803 m), weight 86.2 kg, SpO2 100 %.Body mass index is 26.5 kg/m.  General Appearance: Casual and Fairly Groomed  Eye Contact:  Good  Speech:  Clear and Coherent and Normal Rate  Volume:  Normal  Mood:  Depressed  Affect:  Appropriate and Congruent  Thought Process:  Coherent and Goal Directed  Orientation:  Full (Time, Place, and Person)  Thought Content:  Logical  Suicidal Thoughts:  Yes.  without intent/plan  Homicidal Thoughts:  No  Memory:  Immediate;   Good Recent;   Good Remote;   Good  Judgement:  Poor  Insight:  Lacking  Psychomotor Activity:  Normal  Concentration:  Concentration: Fair  Recall:  [place] of Knowledge:  Fair  Language:  Fair  Akathisia:  No  Handed:    AIMS (if indicated):     Assets:  Resilience Social Support  ADL's:  Intact  Cognition:  WNL  Sleep:  Number of Hours: 6     Have you used any form of tobacco in the last 30 days? (Cigarettes, Smokeless Tobacco, Cigars, and/or Pipes): Yes  Has this patient used any form of tobacco in the last 30 days? (Cigarettes, Smokeless Tobacco, Cigars, and/or Pipes) Yes, Yes, A prescription for an FDA-approved tobacco cessation medication was offered at discharge and the patient refused  Blood Alcohol  level:  Lab Results  Component Value Date   [facility] <10 07/23/2018   ETH <5 92/07/9416    Metabolic Disorder Labs:  Lab Results  Component Value Date   HGBA1C 5.6 07/28/2018   MPG 114.02 07/28/2018   No results found for: PROLACTIN Lab Results  Component Value Date   CHOL 147 07/28/2018   TRIG 57 07/28/2018   HDL 41 07/28/2018   CHOLHDL 3.6 07/28/2018   VLDL 11 07/28/2018   [place] 95 07/28/2018    See Psychiatric Specialty Exam and Suicide Risk Assessment completed by Attending Physician prior to discharge.  Discharge destination:  [facility]  Is patient on multiple antipsychotic  therapies at discharge:  No   Has Patient had three or more failed trials of antipsychotic monotherapy by history:  No  Recommended Plan for Multiple Antipsychotic Therapies: NA   Allergies as of 08/05/2018   No Known Allergies     Medication List    STOP taking these medications   busPIRone 5 MG tablet Commonly known as:  BUSPAR   FLUoxetine 20 MG capsule Commonly known as:  PROZAC   hydrOXYzine 25 MG capsule Commonly known as:  VISTARIL   traZODone 100 MG tablet Commonly known as:  DESYREL     TAKE these medications     Indication  albuterol 108 (90 Base) MCG/ACT inhaler Commonly known as:  PROVENTIL HFA;VENTOLIN HFA Inhale 2 puffs into the lungs every 6 (six) hours as needed for wheezing or shortness of breath.  Indication:  Asthma   amLODipine 10 MG tablet Commonly known as:  NORVASC Take 1 tablet (10 mg total) by mouth daily.  Indication:  High Blood Pressure Disorder   carvedilol 12.5 MG tablet Commonly known as:  COREG Take 1 tablet (12.5 mg total) by mouth daily.  Indication:  Heart Failure   cloNIDine 0.2 MG tablet Commonly known as:  CATAPRES Take 1 tablet (0.2 mg total) by mouth 2 (two) times daily.  Indication:  High Blood Pressure Disorder   furosemide 40 MG tablet Commonly known as:  LASIX Take 1 tablet (40 mg total) by mouth daily.  Indication:  High  Blood Pressure Disorder   hydrOXYzine 50 MG tablet Commonly known as:  ATARAX/VISTARIL Take 1 tablet (50 mg total) by mouth every 8 (eight) hours as needed for anxiety.  Indication:  Feeling Anxious   multivitamin tablet Take 1 tablet by mouth daily.  Indication:  Vitamin Supplementation   QUEtiapine 200 MG tablet Commonly known as:  SEROQUEL Take 1 tablet (200 mg total) by mouth at bedtime.  Indication:  Posttraumatic Stress Disorder   sertraline 100 MG tablet Commonly known as:  ZOLOFT Take 1 tablet (100 mg total) by mouth daily. Start taking on:  08/06/2018  Indication:  Posttraumatic Stress Disorder   sildenafil 100 MG tablet Commonly known as:  VIAGRA Take 100 mg by mouth as needed for erectile dysfunction.  Indication:  Erectile Dysfunction      Follow-up Information    [facility] Follow up.   Why:  In order to be eligible for substance abuse residential treatment, you must have face to face assessment with substance abuse social worker at the mental health clinic per your counselor, Jonelle Sidle. Walk in betweeen 11am-1pm Monday-Friday for assessment.  Contact information: [place] [address] Floor, [facility] [place], [place] [address] Phone: [phone] ext [phone] Fax: [phone]       [name] (Outpatient mental health) Follow up on 08/07/2018.   Why:  Hospital follow-up/medication management appt on Wednesday, 11/20at 2:00PM with Dr. Robynn Pane. Thank you.  Contact information: [place] [address], [facility] [place], [place] [address] Phone: [phone] ext [phone] Fax: [place], [facility] Follow up on 08/06/2018.   Why:  You have been accepted for admission on Tuesday, 11/19. Accepting provider: Dr. Ileene Patrick. Please arrive to the facility by 10:00AM for admission. Thank you.  Contact information: [address] [place]  [address] [phone]           Follow-up recommendations:  Activity:  as tolerated Diet:  normal Tests:  NA Other:  see  above for DC plan  Comments:    Signed: Pennelope Bracken, MD 08/05/2018, 3:27 PM

## 2018-08-05 NOTE — Progress Notes (Signed)
D:  Patient's self inventory sheet, patient has fair sleep, sleep medication given.  Fair appetite, low energy level, poor concentration.  Rated depression, hopeless #8, anxiety #5.  Denied withdrawals. SI off/on, contracts for safety, no plan.  Physical problems, back, worst pain #5.  Pain medication helpful.  Goal is good sleep.  Plans to sleep.  Discharge to [facility] tomorrow. A:  Medications administered per MD orders.  Emotional support and encouragement given patient. R:  Denied HI.  Denied A/V hallucinations.  SI off/on, contracts for safety, no plan.  Denied pain.  Safety maintained with 15 minute checks.

## 2018-08-05 NOTE — B[facility] Suicide Risk Assessment (Signed)
[facility] Discharge Suicide Risk Assessment   Principal Problem: Severe recurrent major depression without psychotic features [facility]) Discharge Diagnoses: Principal Problem:   Severe recurrent major depression without psychotic features ([place]) Active Problems:   PTSD (post-traumatic stress disorder)   Cocaine dependence with cocaine-induced mood disorder ([place])   Total Time spent with patient: 30 minutes  Psychiatric Specialty Exam:   Blood pressure (!) 138/111, pulse 61, temperature 98.2 F (36.8 C), temperature source Oral, resp. rate 20, height 5\' 11"  (1.803 m), weight 86.2 kg, SpO2 100 %.Body mass index is 26.5 kg/m.   Mental Status Per Nursing Assessment::   On Admission:  Suicidal ideation indicated by patient  Demographic Factors:  Male, Low socioeconomic status and Unemployed  Loss Factors: Financial problems/change in socioeconomic status  Historical Factors: Impulsivity  Risk Reduction Factors:   Positive coping skills or problem solving skills  Continued Clinical Symptoms:  Severe Anxiety and/or Agitation Depression:   Comorbid alcohol abuse/dependence Impulsivity Alcohol/Substance Abuse/Dependencies  Cognitive Features That Contribute To Risk:  Thought constriction (tunnel vision)    Suicide Risk:  Moderate:  Frequent suicidal ideation with limited intensity, and duration, some specificity in terms of plans, no associated intent, good self-control, limited dysphoria/symptomatology, some risk factors present, and identifiable protective factors, including available and accessible social support.  Follow-up Information    [facility] Follow up.   Why:  In order to be eligible for substance abuse residential treatment, you must have face to face assessment with substance abuse social worker at the mental health clinic per your counselor, Jonelle Sidle. Walk in betweeen 11am-1pm Monday-Friday for assessment.  Contact information: [place] [address], [facility] [place], [place] [address] Phone: [phone] ext [phone] Fax: [phone]       [name] (Outpatient mental [facility]) Follow up on 08/07/2018.   Why:  Hospital follow-up/medication management appt on Wednesday, 11/20at 2:00PM with Dr. Robynn Pane. Thank you.  Contact information: [place] [address], [facility] [place], [place] [address] Phone: [phone] ext [phone] Fax: [place] Follow up on 08/06/2018.   Why:  You have been accepted for admission on Tuesday, 11/19. Accepting provider: Dr. Ileene Patrick. Please arrive to the facility by 10:00AM for admission. Thank you.  Contact information: [place] [address] [phone]           Plan Of Care/Follow-up recommendations:  Activity:  as tolerated Diet:  normal Tests:  NA Other:  see above for [place], MD 08/05/2018, 3:27 PM

## 2018-08-06 NOTE — Progress Notes (Signed)
Discharge Note:  Patient discharged with police going to [facility] for treatment.  Patient denied SI and HI.  Denied A/V hallucinations.  Suicide prevention information given and reviewed with patient who stated he understood and had no questions. My3 suicide prevention information also given to patient.  Patient stated he received all his belongings, clothing, toiletries, misc items, prescriptions, etc.  Patient stated he appreciated all assistance received from [facility] staff.  All required discharge information given to patient at discharge.

## 2018-08-06 NOTE — Progress Notes (Signed)
D:  Patient's self inventory sheet, patient has fair sleep, sleep medication helpful.  Good appetite, low energy level, poor concentration.  Rated depression and hopeless 5, anxiety 4.       Withdrawals.  Denied SI.  Physical problems, back, worst pain #4 in past 24 hours.  Pain medicine helpful.  Does have discharge plans. A:  Medications administered per MD orders.  Emotional support and encouragement given patient. R:  Denied SI and HI, contracts for safety.  Denied A/V hallucinations.  Denied pain.  Safety maintained with 15 minute checks.

## 2018-08-06 NOTE — Progress Notes (Signed)
  [facility] Adult Case Management Discharge Plan :  Will you be returning to the same living situation after discharge:  No. Pt to be transported for admission to [facility] residential substance abuse treatment.  At discharge, do you have transportation home?: Yes,  Sheriff Do you have the ability to pay for your medications: No. PT will work with [facility], who will provide meds.  Release of information consent forms completed and in the chart;  Patient's signature needed at discharge.  Patient to Follow up at: Follow-up Information    [facility] Follow up.   Why:  In order to be eligible for substance abuse residential treatment, you must have face to face assessment with substance abuse social worker at the mental health clinic per your counselor, Gabriel Ashley. Walk in betweeen 11am-1pm Monday-Friday for assessment.  Contact information: [place] [address], [facility] [place], [place] [address] Phone: [phone] ext [phone] Fax: [phone]       [name] (Outpatient mental health) Follow up on 08/07/2018.   Why:  Hospital follow-up/medication management appt on Wednesday, 11/20at 2:00PM with Dr. Robynn Ashley. Thank you.  Contact information: [place] [address], [facility] [place], [place] [address] Phone: [phone] ext [phone] Fax: [place] Follow up on 08/06/2018.   Why:  You have been accepted for admission on Tuesday, 11/19. Accepting provider: Dr. Ileene Ashley. Please arrive to the facility by 10:00AM for admission. Thank you.  Contact information: [place] [address] [phone]           Next level of care provider has access to [facility] and Suicide Prevention discussed: No. Pt declined consent.  SPE completed with patient.   Have you used any form of tobacco in the last 30 days? (Cigarettes, Smokeless  Tobacco, Cigars, and/or Pipes): Yes  Has patient been referred to the Quitline?: Patient refused referral  Patient has been referred for addiction treatment: Yes  Gabriel Ashley, Gabriel Ashley 08/06/2018, 11:09 AM

## 2019-06-24 DIAGNOSIS — N189 Chronic kidney disease, unspecified: Secondary | ICD-10-CM | POA: Insufficient documentation

## 2020-05-31 ENCOUNTER — Other Ambulatory Visit: Payer: Self-pay

## 2020-05-31 DIAGNOSIS — N189 Chronic kidney disease, unspecified: Secondary | ICD-10-CM

## 2020-05-31 DIAGNOSIS — N170 Acute kidney failure with tubular necrosis: Secondary | ICD-10-CM

## 2020-06-11 ENCOUNTER — Ambulatory Visit (HOSPITAL_COMMUNITY)
Admission: RE | Admit: 2020-06-11 | Discharge: 2020-06-11 | Disposition: A | Discharge status: Home / Self Care | Payer: No Typology Code available for payment source | Source: Ambulatory Visit | Attending: Vascular Surgery | Admitting: Vascular Surgery

## 2020-06-11 ENCOUNTER — Other Ambulatory Visit: Payer: Self-pay

## 2020-06-11 ENCOUNTER — Ambulatory Visit (INDEPENDENT_AMBULATORY_CARE_PROVIDER_SITE_OTHER): Payer: No Typology Code available for payment source | Admitting: Vascular Surgery

## 2020-06-11 ENCOUNTER — Ambulatory Visit (INDEPENDENT_AMBULATORY_CARE_PROVIDER_SITE_OTHER)
Admission: RE | Admit: 2020-06-11 | Discharge: 2020-06-11 | Disposition: A | Discharge status: Home / Self Care | Payer: No Typology Code available for payment source | Source: Ambulatory Visit | Attending: Vascular Surgery | Admitting: Vascular Surgery

## 2020-06-11 ENCOUNTER — Encounter: Payer: Self-pay | Admitting: Vascular Surgery

## 2020-06-11 VITALS — BP 170/119 | HR 71 | Temp 98.2°F | Resp 20 | Ht 71.0 in | Wt 234.0 lb

## 2020-06-11 DIAGNOSIS — N170 Acute kidney failure with tubular necrosis: Secondary | ICD-10-CM

## 2020-06-11 DIAGNOSIS — N186 End stage renal disease: Secondary | ICD-10-CM | POA: Diagnosis not present

## 2020-06-11 DIAGNOSIS — N189 Chronic kidney disease, unspecified: Secondary | ICD-10-CM | POA: Diagnosis not present

## 2020-06-11 NOTE — Progress Notes (Signed)
Patient ID: Gabriel Ashley, male   DOB: 1966/06/20, 54 y.o.   MRN: [id number]  Reason for Consult: New Patient (Initial Visit)   Referred by Raynelle Jan, MD  Subjective:     HPI:  Gabriel Ashley is a 54 y.o. male with end-stage renal disease currently on dialysis via tunneled dialysis catheter.  He has a history of a left radiocephalic AV fistula that failed.  He is right-hand dominant.  He is never had any other upper extremity chest or breast surgery.  Past Medical History:  Diagnosis Date  . Anemia   . Anxiety   . Asthma   . Cannabis use in remisison   . Cocaine use   . ED (erectile dysfunction)   . Hypertension   . Insomnia   . Kidney disease   . OSA (obstructive sleep apnea)   . Post traumatic stress disorder (PTSD)   . Vitamin D deficiency    Family History  Problem Relation Age of Onset  . Breast cancer Mother   . Cancer Father        type unknown   History reviewed. No pertinent surgical history.  Short Social History:  Social History   Tobacco Use  . Smoking status: Current Every Day Smoker    Packs/day: 1.00    Years: 31.00    Pack years: 31.00    Last attempt to quit: 11/03/2017    Years since quitting: 2.6  . Smokeless tobacco: Never Used  Substance Use Topics  . Alcohol use: Not Currently    Comment: 2-3 years ago    No Known Allergies  Current Outpatient Medications  Medication Sig Dispense Refill  . albuterol (PROVENTIL HFA;VENTOLIN HFA) 108 (90 Base) MCG/ACT inhaler Inhale 2 puffs into the lungs every 6 (six) hours as needed for wheezing or shortness of breath. 1 Inhaler 0  . amLODipine (NORVASC) 10 MG tablet Take 1 tablet (10 mg total) by mouth daily. 30 tablet 0  . carvedilol (COREG) 12.5 MG tablet Take 1 tablet (12.5 mg total) by mouth daily. 30 tablet 0  . cloNIDine (CATAPRES) 0.2 MG tablet Take 1 tablet (0.2 mg total) by mouth 2 (two) times daily. 60 tablet 0  . furosemide (LASIX) 40 MG tablet Take 1 tablet (40 mg total) by mouth daily.  30 tablet 0  . hydrOXYzine (ATARAX/VISTARIL) 50 MG tablet Take 1 tablet (50 mg total) by mouth every 8 (eight) hours as needed for anxiety. 30 tablet 0  . Multiple Vitamin (MULTIVITAMIN) tablet Take 1 tablet by mouth daily.    . QUEtiapine (SEROQUEL) 200 MG tablet Take 1 tablet (200 mg total) by mouth at bedtime. 30 tablet 0  . sertraline (ZOLOFT) 100 MG tablet Take 1 tablet (100 mg total) by mouth daily. 30 tablet 0  . sildenafil (VIAGRA) 100 MG tablet Take 100 mg by mouth as needed for erectile dysfunction.   11   No current facility-administered medications for this visit.    Review of Systems  Constitutional:  Constitutional negative. HENT: HENT negative.  Eyes: Eyes negative.  Respiratory: Respiratory negative.  Cardiovascular: Cardiovascular negative.  GI: Gastrointestinal negative.  Musculoskeletal: Musculoskeletal negative.  Skin: Skin negative.  Neurological: Neurological negative. Hematologic: Hematologic/lymphatic negative.  Psychiatric: Psychiatric negative.        Objective:  Objective   Vitals:   06/11/20 1429  BP: (!) 170/119  Pulse: 71  Resp: 20  Temp: 98.2 F (36.8 C)  SpO2: 97%  Weight: 234 lb (106.1 kg)  Height: 5\' 11"  (1.803  m)   Body mass index is 32.64 kg/m.  Physical Exam HENT:     Head: Normocephalic.     Nose:     Comments: Wearing a mask Cardiovascular:     Rate and Rhythm: Normal rate.  Pulmonary:     Effort: Pulmonary effort is normal.  Abdominal:     General: Abdomen is flat.     Palpations: Abdomen is soft.  Musculoskeletal:        General: No swelling. Normal range of motion.     Cervical back: Normal range of motion and neck supple.  Skin:    General: Skin is warm and dry.     Capillary Refill: Capillary refill takes less than 2 seconds.  Neurological:     General: No focal deficit present.     Mental Status: He is alert.  Psychiatric:        Mood and Affect: Mood normal.        Behavior: Behavior normal.        Thought  Content: Thought content normal.        Judgment: Judgment normal.     Data: +-----------------+-------------+----------+---------+  Right Cephalic  Diameter (cm)Depth (cm)Findings   +-----------------+-------------+----------+---------+  Shoulder       0.33               +-----------------+-------------+----------+---------+  Prox upper arm    0.31               +-----------------+-------------+----------+---------+  Mid upper arm    0.25        branching  +-----------------+-------------+----------+---------+  Dist upper arm    0.23               +-----------------+-------------+----------+---------+  Antecubital fossa  0.52               +-----------------+-------------+----------+---------+  Prox forearm     0.17               +-----------------+-------------+----------+---------+  Mid forearm     0.24               +-----------------+-------------+----------+---------+  Dist forearm     0.18               +-----------------+-------------+----------+---------+   +-----------------+-------------+----------+--------+  Right Basilic  Diameter (cm)Depth (cm)Findings  +-----------------+-------------+----------+--------+  Prox upper arm    0.49              +-----------------+-------------+----------+--------+  Mid upper arm    0.46              +-----------------+-------------+----------+--------+  Dist upper arm    0.52              +-----------------+-------------+----------+--------+  Antecubital fossa  0.40              +-----------------+-------------+----------+--------+   +-----------------+-------------+----------+--------+  Left Cephalic  Diameter (cm)Depth (cm)Findings   +-----------------+-------------+----------+--------+  Shoulder       0.24              +-----------------+-------------+----------+--------+  Prox upper arm    0.24              +-----------------+-------------+----------+--------+  Mid upper arm    0.26              +-----------------+-------------+----------+--------+  Dist upper arm    0.29              +-----------------+-------------+----------+--------+  Antecubital fossa  0.55              +-----------------+-------------+----------+--------+  Prox forearm     0.35              +-----------------+-------------+----------+--------+  Mid forearm     0.14              +-----------------+-------------+----------+--------+  Dist forearm     0.14              +-----------------+-------------+----------+--------+   +-----------------+-------------+----------+--------+  Left Basilic   Diameter (cm)Depth (cm)Findings  +-----------------+-------------+----------+--------+  Mid upper arm    0.46              +-----------------+-------------+----------+--------+  Dist upper arm    0.51              +-----------------+-------------+----------+--------+  Antecubital fossa  0.48              +-----------------+-------------+----------+--------+  Prox forearm     0.32              +-----------------+-------------+----------+--------+   Summary: Right: Patent and compressible cephalic and basilic veins.  Left: Patent and compressible cephalic and basilic veins.   Right Pre-Dialysis Findings:  +-----------------------+----------+--------------------+---------+--------  +  Location        PSV (cm/s)Intralum. Diam. (cm)Waveform  Comments   +-----------------------+----------+--------------------+---------+--------  +  Brachial Antecub. fossa63    0.54        triphasic        +-----------------------+----------+--------------------+---------+--------  +  Radial Art at Wrist  63    0.28        triphasic        +-----------------------+----------+--------------------+---------+--------  +  Ulnar Art at Wrist   63    0.25        triphasic        +-----------------------+----------+--------------------+---------+--------  +      Left Pre-Dialysis Findings:  +-----------------------+----------+--------------------+---------+--------  +  Location        PSV (cm/s)Intralum. Diam. (cm)Waveform  Comments  +-----------------------+----------+--------------------+---------+--------  +  Brachial Antecub. fossa78    0.49        triphasic        +-----------------------+----------+--------------------+---------+--------  +  Radial Art at Wrist  62    0.28        triphasic        +-----------------------+----------+--------------------+---------+--------  +  Ulnar Art at Wrist   44    0.19        triphasic        +-----------------------+----------+--------------------+---------+--------  +      Assessment/Plan:     54 year old male right-hand-dominant on dialysis via catheter.  Plan will be for left upper extremity AV fistula versus graft.  We discussed the options being cephalic versus basilic versus need for graft.  He demonstrates good understanding.  We also discussed the risk and benefits he also demonstrates good understanding.  We will get him set up on a nondialysis day in the near future.     Waynetta Sandy MD [facility]

## 2020-07-05 ENCOUNTER — Other Ambulatory Visit (HOSPITAL_COMMUNITY): Payer: Non-veteran care

## 2020-07-06 ENCOUNTER — Encounter (HOSPITAL_COMMUNITY): Payer: Self-pay | Admitting: Physician Assistant

## 2020-07-06 ENCOUNTER — Telehealth: Payer: Self-pay

## 2020-07-06 MED ORDER — DEXTROSE 5 % IV SOLN: 1.0000 g | Freq: Once | INTRAVENOUS | Status: DC

## 2020-07-06 NOTE — Addendum Note (Signed)
Addended by: Nicholas Lose on: 07/06/2020 11:10 AM   Modules accepted: Orders

## 2020-07-06 NOTE — Telephone Encounter (Signed)
Message received from scheduling, pt called to cancel surgery on tomorrow. Pt was scheduled for a L arm AVF vs AVG with Dr. Donzetta Matters. MD notified.

## 2020-07-07 ENCOUNTER — Encounter (HOSPITAL_COMMUNITY): Admission: RE | Payer: Self-pay | Source: Home / Self Care

## 2020-07-07 ENCOUNTER — Ambulatory Visit (HOSPITAL_COMMUNITY)
Admission: RE | Admit: 2020-07-07 | Payer: No Typology Code available for payment source | Source: Home / Self Care | Admitting: Vascular Surgery

## 2020-07-07 SURGERY — ARTERIOVENOUS (AV) FISTULA CREATION
Anesthesia: Monitor Anesthesia Care | Laterality: Left

## 2020-07-28 ENCOUNTER — Other Ambulatory Visit: Payer: Self-pay

## 2020-08-16 ENCOUNTER — Encounter (HOSPITAL_COMMUNITY): Payer: Self-pay | Admitting: Vascular Surgery

## 2020-08-16 ENCOUNTER — Other Ambulatory Visit (HOSPITAL_COMMUNITY): Payer: No Typology Code available for payment source

## 2020-08-16 NOTE — Progress Notes (Signed)
Denies chest pain or shortness of breath. Reports he sees a cardiologist at [facility]. COVID test rescheduled for 08/17/2020. Will request records from [place].

## 2020-08-17 ENCOUNTER — Other Ambulatory Visit (HOSPITAL_COMMUNITY)
Admission: RE | Admit: 2020-08-17 | Discharge: 2020-08-17 | Disposition: A | Discharge status: Home / Self Care | Payer: No Typology Code available for payment source | Source: Ambulatory Visit | Attending: Vascular Surgery | Admitting: Vascular Surgery

## 2020-08-17 DIAGNOSIS — Z01812 Encounter for preprocedural laboratory examination: Secondary | ICD-10-CM | POA: Diagnosis present

## 2020-08-17 DIAGNOSIS — Z20822 Contact with and (suspected) exposure to covid-19: Secondary | ICD-10-CM | POA: Diagnosis not present

## 2020-08-17 LAB — SARS CORONAVIRUS 2 (TAT 6-24 HRS): SARS Coronavirus 2: NEGATIVE

## 2020-08-17 NOTE — Anesthesia Preprocedure Evaluation (Addendum)
Anesthesia Evaluation  Patient identified by MRN, date of birth, ID band Patient awake    Reviewed: Allergy & Precautions, NPO status , Patient's Chart, lab work & pertinent test results  Airway Mallampati: III  TM Distance: >3 FB Neck ROM: Full    Dental  (+) Teeth Intact, Dental Advisory Given   Pulmonary Current Smoker,    breath sounds clear to auscultation       Cardiovascular hypertension,  Rhythm:Regular Rate:Normal     Neuro/Psych    GI/Hepatic   Endo/Other    Renal/GU      Musculoskeletal   Abdominal   Peds  Hematology   Anesthesia Other Findings   Reproductive/Obstetrics                            Anesthesia Physical Anesthesia Plan  ASA: III  Anesthesia Plan: General   Post-op Pain Management:    Induction: Intravenous  PONV Risk Score and Plan: Ondansetron and Dexamethasone  Airway Management Planned: LMA  Additional Equipment:   Intra-op Plan:   Post-operative Plan:   Informed Consent: I have reviewed the patients History and Physical, chart, labs and discussed the procedure including the risks, benefits and alternatives for the proposed anesthesia with the patient or authorized representative who has indicated his/her understanding and acceptance.     Dental advisory given  Plan Discussed with: CRNA and Anesthesiologist  Anesthesia Plan Comments: (See PAT note written 08/17/2020 by Myra Gianotti, PA-C. )       Anesthesia Quick Evaluation

## 2020-08-17 NOTE — Progress Notes (Signed)
Anesthesia Chart Review: SAME DAY WORK-UP   Case: [id number] Date/Time: 08/18/20 1020   Procedure: LEFT ARM ARTERIOVENOUS (AV) FISTULA VERSUS ARTERIOVENOUS GORE-TEX GRAFT (Left )   Anesthesia type: Monitor Anesthesia Care   Pre-op diagnosis: END STAGE RENAL DISEASE   Location: [facility] OR ROOM 09 / [facility]   Surgeons: Waynetta Sandy, MD      DISCUSSION: Patient is a 54 year old male scheduled for the above procedure. By 06/11/20 VVS notes, patient with ESRD on dialysis via tunneled dialysis catheter. He had a failed left radiocephalic AVF and needs new permanent HD access. Surgery was scheduled for October, but patient cancelled.  History includes smoking, HTN, ESRD (hemodialysis through [facility]), CHF, OSA, anemia, PTSD, anxiety, depression (previous SI admission), asthma, substance abuse (cocaine, marijuana; reported being clean for > 1 year 08/16/20).  - He reported seeing a cardiologist at the [facility] He denied chest pain and SOB per PAT RN phone interview on 08/16/20. I contacted patient on 08/17/20 to obtain more specific details about his cardiac history, but he declined talking while at dialysis and if further questions requested call after 5:30 PM. I attempted to contact Michell Heinrich, RN the vascular access coordinator at the [facility] (phone [phone], est [phone]; fax [phone]) to inquire if she could provide additional details, but she is out of the office until 08/18/20 at 1:00 PM. Historically, the [facility] will not send records without a signed release. We do have records available from 11/2016 admission by cardiologist Dixie Dials, MD for hypertensive emergency and acute systolic and diastolic CHF (in setting of off medication for 2 years, + cocaine, and CKD). Stress test suggestive of prior inferior and inferoapical wall infarct with no reversible ischemia, LVEF 29% but 40-45% by echo. Echo also showed mild AR, severe MR, moderate TR, PA peak pressure 48 mmHg. Medical therapy  recommended.  Patient is a same day work-up. BP at 06/11/20 VVS visit was elevated at 170/119. By current medication list, he is on amlodipine, Coreg, clonidine and was instructed by staff to take on the morning of procedure. He did tell me that while at his [facility] dialysis session on 08/17/20, his BP was 117/71.    Preoperative COVID-19 test is scheduled for 08/17/20. Known history of combined systolic and diastolic CHF with non-ischemic stress test but evidence of inferior infarct on 2018 stress test. EF 40-45% by echo. As above, he reports seeing a cardiologist through the [facility]. Reported being "clean" for illicit drug use of > 1 year. He denied chest pain and SOB. He is now on dialysis, last 08/17/20. Anesthesia team to evaluate on the day of surgery.  If a release of medical records is signed, then the [facility] Records department can be called at [phone], ext [phone].   VS:  At 06/11/20 VVS visit, BP 170/119, HR 71. At his [facility] dialysis session on 08/17/20, his BP was 117/71.    PROVIDERS: He receives care through the [facility].    LABS: He is for ISTAT on the day of procedure.    EKG: Currently last viewable EKG available is from 12/05/16 and showed SB, VH, prolonged QT, T wave abnormality (consider lateral ischemia). By Report Narrative, 06/24/19 EKG at [facility] showed NSR, inferior T wave abnormality.   CV: Nuclear stress test 12/06/16: IMPRESSION: 1. No reversible ischemia. Inferior and inferoapical wall myocardial infarct. 2. Inferior and inferoapical wall hypokinesis, with moderate left ventricular dilatation. 3. Left ventricular ejection fraction 29% 4. Non invasive risk stratification*: High *2012 Appropriate  Use Criteria for Coronary Revascularization Focused Update: J Am Coll Cardiol. 6701;41(0):301-314. http://content.airportbarriers.com.aspx?articleid=1201161   Echo 12/05/2016: Study Conclusions  - Left ventricle: The cavity size was  mildly dilated. There was  mild concentric hypertrophy. Systolic function was mildly to  moderately reduced. The estimated ejection fraction was in the  range of 40% to 45%. There is hypokinesis of the entire  myocardium. Doppler parameters are consistent with abnormal left  ventricular relaxation (grade 1 diastolic dysfunction).  - Aortic valve: There was mild regurgitation.  - Mitral valve: There was severe regurgitation.  - Left atrium: The atrium was severely dilated.  - Right ventricle: Systolic function was mildly reduced.  - Right atrium: The atrium was moderately dilated.  - Tricuspid valve: There was moderate regurgitation.  - Pulmonary arteries: Systolic pressure was mildly to moderately  increased. PA peak pressure: 48 mm Hg (S).     Past Medical History:  Diagnosis Date  . Anemia   . Anxiety   . Asthma   . Cannabis use in remisison   . CHF (congestive heart failure) ([place])   . Cocaine use   . Depression   . ED (erectile dysfunction)   . Hypertension   . Insomnia   . Kidney disease   . OSA (obstructive sleep apnea)    on CPAP  . Post traumatic stress disorder (PTSD)   . Vitamin D deficiency     Past Surgical History:  Procedure Laterality Date  . left arm surgery      MEDICATIONS: . ceFAZolin (ANCEF) 1 g in dextrose 5 % 100 mL IVPB   . albuterol (PROVENTIL HFA;VENTOLIN HFA) 108 (90 Base) MCG/ACT inhaler  . amLODipine (NORVASC) 10 MG tablet  . carvedilol (COREG) 12.5 MG tablet  . cloNIDine (CATAPRES) 0.2 MG tablet  . furosemide (LASIX) 40 MG tablet  . hydrOXYzine (ATARAX/VISTARIL) 50 MG tablet  . Multiple Vitamin (MULTIVITAMIN) tablet  . QUEtiapine (SEROQUEL) 200 MG tablet  . sertraline (ZOLOFT) 100 MG tablet  . sildenafil (VIAGRA) 100 MG tablet    Myra Gianotti, PA-C Surgical Short Stay/Anesthesiology [facility] Phone [phone] [facility] Phone [phone] 08/17/2020 11:55 AM

## 2020-08-18 ENCOUNTER — Ambulatory Visit (HOSPITAL_COMMUNITY): Payer: No Typology Code available for payment source | Admitting: Vascular Surgery

## 2020-08-18 ENCOUNTER — Encounter (HOSPITAL_COMMUNITY)
Admission: RE | Disposition: A | Discharge status: Home / Self Care | Payer: Self-pay | Source: Home / Self Care | Attending: Vascular Surgery

## 2020-08-18 ENCOUNTER — Ambulatory Visit (HOSPITAL_COMMUNITY)
Admission: RE | Admit: 2020-08-18 | Discharge: 2020-08-18 | Disposition: A | Discharge status: Home / Self Care | Payer: No Typology Code available for payment source | Attending: Vascular Surgery | Admitting: Vascular Surgery

## 2020-08-18 ENCOUNTER — Encounter (HOSPITAL_COMMUNITY): Payer: Self-pay | Admitting: Vascular Surgery

## 2020-08-18 ENCOUNTER — Other Ambulatory Visit: Payer: Self-pay

## 2020-08-18 DIAGNOSIS — N186 End stage renal disease: Secondary | ICD-10-CM | POA: Insufficient documentation

## 2020-08-18 DIAGNOSIS — I120 Hypertensive chronic kidney disease with stage 5 chronic kidney disease or end stage renal disease: Secondary | ICD-10-CM | POA: Diagnosis not present

## 2020-08-18 DIAGNOSIS — N1831 Chronic kidney disease, stage 3a: Secondary | ICD-10-CM

## 2020-08-18 DIAGNOSIS — N185 Chronic kidney disease, stage 5: Secondary | ICD-10-CM

## 2020-08-18 DIAGNOSIS — Z79899 Other long term (current) drug therapy: Secondary | ICD-10-CM | POA: Insufficient documentation

## 2020-08-18 DIAGNOSIS — F17210 Nicotine dependence, cigarettes, uncomplicated: Secondary | ICD-10-CM | POA: Insufficient documentation

## 2020-08-18 DIAGNOSIS — Z992 Dependence on renal dialysis: Secondary | ICD-10-CM | POA: Diagnosis not present

## 2020-08-18 HISTORY — DX: Depression, unspecified: F32.A

## 2020-08-18 HISTORY — DX: Heart failure, unspecified: I50.9

## 2020-08-18 HISTORY — PX: AV FISTULA PLACEMENT: SHX1204

## 2020-08-18 LAB — POCT I-STAT, CHEM 8
BUN: 47 mg/dL — ABNORMAL HIGH (ref 6–20)
Calcium, Ion: 0.95 mmol/L — ABNORMAL LOW (ref 1.15–1.40)
Chloride: 98 mmol/L (ref 98–111)
Creatinine, Ser: 13.9 mg/dL — ABNORMAL HIGH (ref 0.61–1.24)
Glucose, Bld: 93 mg/dL (ref 70–99)
HCT: 32 % — ABNORMAL LOW (ref 39.0–52.0)
Hemoglobin: 10.9 g/dL — ABNORMAL LOW (ref 13.0–17.0)
Potassium: 5 mmol/L (ref 3.5–5.1)
Sodium: 135 mmol/L (ref 135–145)
TCO2: 23 mmol/L (ref 22–32)

## 2020-08-18 SURGERY — ARTERIOVENOUS (AV) FISTULA CREATION
Anesthesia: General | Laterality: Left

## 2020-08-18 MED ORDER — CHLORHEXIDINE GLUCONATE 4 % EX LIQD: 60.0000 mL | Freq: Once | CUTANEOUS | Status: DC

## 2020-08-18 MED ORDER — SODIUM CHLORIDE 0.9 % IV SOLN: INTRAVENOUS | Status: DC | PRN

## 2020-08-18 MED ORDER — FENTANYL CITRATE (PF) 250 MCG/5ML IJ SOLN
INTRAMUSCULAR | Status: AC
  Filled 2020-08-18: qty 5

## 2020-08-18 MED ORDER — ORAL CARE MOUTH RINSE: 15.0000 mL | Freq: Once | OROMUCOSAL | Status: AC

## 2020-08-18 MED ORDER — OXYCODONE-ACETAMINOPHEN 5-325 MG PO TABS
1.0000 | ORAL_TABLET | ORAL | 0 refills | Status: DC | PRN
Start: 2020-08-18 — End: 2021-01-10

## 2020-08-18 MED ORDER — CEFAZOLIN SODIUM-DEXTROSE 2-4 GM/100ML-% IV SOLN
INTRAVENOUS | Status: AC
  Filled 2020-08-18: qty 100

## 2020-08-18 MED ORDER — ONDANSETRON HCL 4 MG/2ML IJ SOLN: 4.0000 mg | Freq: Once | INTRAMUSCULAR | Status: DC | PRN

## 2020-08-18 MED ORDER — MIDAZOLAM HCL 5 MG/5ML IJ SOLN
INTRAMUSCULAR | Status: DC | PRN
  Administered 2020-08-18: 2 mg via INTRAVENOUS

## 2020-08-18 MED ORDER — FENTANYL CITRATE (PF) 100 MCG/2ML IJ SOLN: 25.0000 ug | INTRAMUSCULAR | Status: DC | PRN

## 2020-08-18 MED ORDER — PROPOFOL 10 MG/ML IV BOLUS
INTRAVENOUS | Status: AC
  Filled 2020-08-18: qty 20

## 2020-08-18 MED ORDER — CEFAZOLIN SODIUM-DEXTROSE 2-4 GM/100ML-% IV SOLN
2.0000 g | INTRAVENOUS | Status: AC
  Administered 2020-08-18: 2 g via INTRAVENOUS

## 2020-08-18 MED ORDER — CHLORHEXIDINE GLUCONATE 0.12 % MT SOLN: 15.0000 mL | Freq: Once | OROMUCOSAL | Status: AC

## 2020-08-18 MED ORDER — PROPOFOL 10 MG/ML IV BOLUS
INTRAVENOUS | Status: DC | PRN
  Administered 2020-08-18: 10 mg via INTRAVENOUS

## 2020-08-18 MED ORDER — PROPOFOL 500 MG/50ML IV EMUL
INTRAVENOUS | Status: DC | PRN
  Administered 2020-08-18: 100 ug/kg/min via INTRAVENOUS

## 2020-08-18 MED ORDER — OXYCODONE HCL 5 MG/5ML PO SOLN: 5.0000 mg | Freq: Once | ORAL | Status: DC | PRN

## 2020-08-18 MED ORDER — SODIUM CHLORIDE 0.9 % IV SOLN
INTRAVENOUS | Status: AC
  Filled 2020-08-18: qty 1.2

## 2020-08-18 MED ORDER — CHLORHEXIDINE GLUCONATE 0.12 % MT SOLN
OROMUCOSAL | Status: AC
  Administered 2020-08-18: 15 mL via OROMUCOSAL
  Filled 2020-08-18: qty 15

## 2020-08-18 MED ORDER — FENTANYL CITRATE (PF) 100 MCG/2ML IJ SOLN
INTRAMUSCULAR | Status: DC | PRN
  Administered 2020-08-18: 75 ug via INTRAVENOUS

## 2020-08-18 MED ORDER — LIDOCAINE-EPINEPHRINE (PF) 1 %-1:200000 IJ SOLN
INTRAMUSCULAR | Status: DC | PRN
  Administered 2020-08-18: 30 mL

## 2020-08-18 MED ORDER — MIDAZOLAM HCL 2 MG/2ML IJ SOLN
INTRAMUSCULAR | Status: AC
  Filled 2020-08-18: qty 2

## 2020-08-18 MED ORDER — 0.9 % SODIUM CHLORIDE (POUR BTL) OPTIME
TOPICAL | Status: DC | PRN
  Administered 2020-08-18: 1000 mL

## 2020-08-18 MED ORDER — SODIUM CHLORIDE 0.9 % IV SOLN: INTRAVENOUS | Status: DC

## 2020-08-18 MED ORDER — OXYCODONE HCL 5 MG PO TABS: 5.0000 mg | ORAL_TABLET | Freq: Once | ORAL | Status: DC | PRN

## 2020-08-18 MED ORDER — LIDOCAINE-EPINEPHRINE (PF) 1 %-1:200000 IJ SOLN
INTRAMUSCULAR | Status: AC
  Filled 2020-08-18: qty 30

## 2020-08-18 SURGICAL SUPPLY — 29 items
ARMBAND PINK RESTRICT EXTREMIT (MISCELLANEOUS) ×3 IMPLANT
CANISTER SUCT 3000ML PPV (MISCELLANEOUS) ×3 IMPLANT
CLIP VESOCCLUDE MED 6/CT (CLIP) ×3 IMPLANT
CLIP VESOCCLUDE SM WIDE 6/CT (CLIP) ×3 IMPLANT
COVER PROBE W GEL 5X96 (DRAPES) ×3 IMPLANT
COVER WAND RF STERILE (DRAPES) IMPLANT
DERMABOND ADVANCED (GAUZE/BANDAGES/DRESSINGS) ×2
DERMABOND ADVANCED .7 DNX12 (GAUZE/BANDAGES/DRESSINGS) ×1 IMPLANT
ELECT REM PT RETURN 9FT ADLT (ELECTROSURGICAL)
ELECTRODE REM PT RTRN 9FT ADLT (ELECTROSURGICAL) IMPLANT
GLOVE BIO SURGEON STRL SZ7.5 (GLOVE) ×3 IMPLANT
GLOVE SURG POLY MICRO LF SZ6 (GLOVE) ×3 IMPLANT
GOWN STRL REUS W/ TWL LRG LVL3 (GOWN DISPOSABLE) ×2 IMPLANT
GOWN STRL REUS W/ TWL XL LVL3 (GOWN DISPOSABLE) ×1 IMPLANT
GOWN STRL REUS W/TWL LRG LVL3 (GOWN DISPOSABLE) ×4
GOWN STRL REUS W/TWL XL LVL3 (GOWN DISPOSABLE) ×2
INSERT FOGARTY SM (MISCELLANEOUS) IMPLANT
KIT BASIN OR (CUSTOM PROCEDURE TRAY) ×3 IMPLANT
KIT TURNOVER KIT B (KITS) ×3 IMPLANT
NS IRRIG 1000ML POUR BTL (IV SOLUTION) ×3 IMPLANT
PACK CV ACCESS (CUSTOM PROCEDURE TRAY) ×3 IMPLANT
PAD ARMBOARD 7.5X6 YLW CONV (MISCELLANEOUS) ×6 IMPLANT
SUT MNCRL AB 4-0 PS2 18 (SUTURE) ×3 IMPLANT
SUT PROLENE 6 0 BV (SUTURE) ×6 IMPLANT
SUT VIC AB 3-0 SH 27 (SUTURE) ×2
SUT VIC AB 3-0 SH 27X BRD (SUTURE) ×1 IMPLANT
TOWEL GREEN STERILE (TOWEL DISPOSABLE) ×3 IMPLANT
UNDERPAD 30X36 HEAVY ABSORB (UNDERPADS AND DIAPERS) ×3 IMPLANT
WATER STERILE IRR 1000ML POUR (IV SOLUTION) ×3 IMPLANT

## 2020-08-18 NOTE — Anesthesia Procedure Notes (Signed)
Procedure Name: MAC Date/Time: 08/18/2020 10:55 AM Performed by: Eligha Bridegroom, CRNA Pre-anesthesia Checklist: Patient identified, Emergency Drugs available, Suction available, Patient being monitored and Timeout performed Patient Re-evaluated:Patient Re-evaluated prior to induction Oxygen Delivery Method: Nasal cannula Preoxygenation: Pre-oxygenation with 100% oxygen Induction Type: IV induction

## 2020-08-18 NOTE — Anesthesia Postprocedure Evaluation (Signed)
Anesthesia Post Note  Patient: Gabriel Ashley  Procedure(s) Performed: LEFT ARM ARTERIOVENOUS (AV) FISTULA (Left )     Patient location during evaluation: PACU Anesthesia Type: General Level of consciousness: awake and alert Pain management: pain level controlled Vital Signs Assessment: post-procedure vital signs reviewed and stable Respiratory status: spontaneous breathing, nonlabored ventilation, respiratory function stable and patient connected to nasal cannula oxygen Cardiovascular status: stable and blood pressure returned to baseline Postop Assessment: no apparent nausea or vomiting Anesthetic complications: no   No complications documented.  Last Vitals:  Vitals:   08/18/20 1236 08/18/20 1251  BP: 128/84 125/88  Pulse: 78 75  Resp: 16 18  Temp:  36.4 C  SpO2: 100% 100%    Last Pain:  Vitals:   08/18/20 1251  PainSc: 0-No pain                 Glover Capano COKER

## 2020-08-18 NOTE — Transfer of Care (Signed)
Immediate Anesthesia Transfer of Care Note  Patient: Gabriel Ashley  Procedure(s) Performed: LEFT ARM ARTERIOVENOUS (AV) FISTULA (Left )  Patient Location: PACU  Anesthesia Type:MAC  Level of Consciousness: awake, alert  and oriented  Airway & Oxygen Therapy: Patient Spontanous Breathing  Post-op Assessment: Report given to RN and Post -op Vital signs reviewed and stable  Post vital signs: Reviewed and stable  Last Vitals:  Vitals Value Taken Time  BP    Temp    Pulse 80 08/18/20 1222  Resp 19 08/18/20 1222  SpO2 98 % 08/18/20 1222  Vitals shown include unvalidated device data.  Last Pain:  Vitals:   08/18/20 0914  PainSc: 0-No pain      Patients Stated Pain Goal: 3 (11/08/77 8102)  Complications: No complications documented.

## 2020-08-18 NOTE — Op Note (Signed)
    Patient name: Gabriel Ashley MRN: [id number] DOB: Jun 29, 1966 Sex: male  08/18/2020 Pre-operative Diagnosis: esrd Post-operative diagnosis:  Same Surgeon:  Erlene Quan C. Donzetta Matters, MD Assistant: Paulo Fruit, PA Procedure Performed: Left arm brachial artery to cephalic vein AV fistula creation  Indications:  54yo AAM history of end-stage renal disease.  He has a previously failed left arm wrist cephalic fistula.  He is now indicated for upper arm fistula versus graft.  An assistant was necessary for suction, retraction, anastomotic and wound closure assistance.  Findings: Fistula at the antecubitum measured approximately 5 mm.  In the upper arm it was smaller than this.  I did easily dilate.  After fistula creation there was a strong thrill in the upper arm.  There is a palpable radial artery pulse at the wrist both confirmed with Doppler.   Procedure:  The patient was identified in the holding area and taken to the operating room where is placed supine on the operating table.  MAC anesthesia was induced.  He was sterilely prepped draped in left upper extremity usual fashion antibiotics were ministered a timeout was called.  Ultrasound was used to identify what appeared to be a large cephalic vein.  The area was anesthetized 1% lidocaine.  Transverse incision was made.  Dissected out the vein for several centimeters dividing branches between ties.  We dissected through the deep fascia placed a vessel loop around the artery.  The vein was transected distally tied off more for rotation flushed with heparinized saline clamped and spatulated.  We then clamped the artery distally proximally opened longitudinally flushed with heparinized saline distally only.  We then sewed the vein end-to-side with 6-0 Prolene suture.  Prior completion without flushing all directions.  Upon completion we did free up some soft tissue clamp a few branches higher in the arm.  At this time there was a strong thrill there was strong  signal with Doppler palpable radial artery pulse at the wrist also confirmed with Doppler.  We obtained stasis closed in layers of Vicryl and Monocryl.  Dermabond placed to the level of the skin.  He was awakened from anesthesia having tolerated procedure without any complication.  All counts were correct at completion.   EBL: 30cc   Dejohn Ibarra C. Donzetta Matters, MD [facility] [place] Office: [phone] Pager: [phone]

## 2020-08-18 NOTE — Discharge Instructions (Signed)
[facility]  Discharge Instructions  AV Fistula or Graft Surgery for Dialysis Access  Please refer to the following instructions for your post-procedure care. Your surgeon or physician assistant will discuss any changes with you.  Activity  You may drive the day following your surgery, if you are comfortable and no longer taking prescription pain medication. Resume full activity as the soreness in your incision resolves.  Bathing/Showering  You may shower after you go home. Keep your incision dry for 48 hours. Do not soak in a bathtub, hot tub, or swim until the incision heals completely. You may not shower if you have a hemodialysis catheter.  Incision Care  Clean your incision with mild soap and water after 48 hours. Pat the area dry with a clean towel. You do not need a bandage unless otherwise instructed. Do not apply any ointments or creams to your incision. You may have skin glue on your incision. Do not peel it off. It will come off on its own in about one week. Your arm may swell a bit after surgery. To reduce swelling use pillows to elevate your arm so it is above your heart. Your doctor will tell you if you need to lightly wrap your arm with an ACE bandage.  Diet  Resume your normal diet. There are not special food restrictions following this procedure. In order to heal from your surgery, it is CRITICAL to get adequate nutrition. Your body requires vitamins, minerals, and protein. Vegetables are the best source of vitamins and minerals. Vegetables also provide the perfect balance of protein. Processed food has little nutritional value, so try to avoid this.  Medications  Resume taking all of your medications. If your incision is causing pain, you may take over-the counter pain relievers such as acetaminophen (Tylenol). If you were prescribed a stronger pain medication, please be aware these medications can cause nausea and constipation. Prevent  nausea by taking the medication with a snack or meal. Avoid constipation by drinking plenty of fluids and eating foods with high amount of fiber, such as fruits, vegetables, and grains.  Do not take Tylenol if you are taking prescription pain medications.  Follow up Your surgeon may want to see you in the office following your access surgery. If so, this will be arranged at the time of your surgery.  Please call us immediately for any of the following conditions:  . Increased pain, redness, drainage (pus) from your incision site . Fever of 101 degrees or higher . Severe or worsening pain at your incision site . Hand pain or numbness. .  Reduce your risk of vascular disease:  . Stop smoking. If you would like help, call [facility] at 1-800-QUIT-NOW 680-854-6665) or [place] at (905) 839-6902  . Manage your cholesterol . Maintain a desired weight . Control your diabetes . Keep your blood pressure down  Dialysis  It will take several weeks to several months for your new dialysis access to be ready for use. Your surgeon will determine when it is okay to use it. Your nephrologist will continue to direct your dialysis. You can continue to use your Permcath until your new access is ready for use.   08/18/2020 Gabriel Ashley [id number] 16-Dec-1965  Surgeon(s): Waynetta Sandy, MD  Procedure(s): LEFT ARM ARTERIOVENOUS (AV) FISTULA   May stick graft immediately   May stick graft on designated area only:   X Do not stick Left AV fistula for 12 weeks    If  you have any questions, please call the office at [phone].

## 2020-08-18 NOTE — H&P (Signed)
HPI:  Gabriel Ashley is a 54 y.o. male with end-stage renal disease currently on dialysis via tunneled dialysis catheter.  He has a history of a left radiocephalic AV fistula that failed.  He is right-hand dominant.  He is never had any other upper extremity chest or breast surgery.      Past Medical History:  Diagnosis Date  . Anemia   . Anxiety   . Asthma   . Cannabis use in remisison   . Cocaine use   . ED (erectile dysfunction)   . Hypertension   . Insomnia   . Kidney disease   . OSA (obstructive sleep apnea)   . Post traumatic stress disorder (PTSD)   . Vitamin D deficiency         Family History  Problem Relation Age of Onset  . Breast cancer Mother   . Cancer Father        type unknown   History reviewed. No pertinent surgical history.  Short Social History:  Social History        Tobacco Use  . Smoking status: Current Every Day Smoker    Packs/day: 1.00    Years: 31.00    Pack years: 31.00    Last attempt to quit: 11/03/2017    Years since quitting: 2.6  . Smokeless tobacco: Never Used  Substance Use Topics  . Alcohol use: Not Currently    Comment: 2-3 years ago    No Known Allergies        Current Outpatient Medications  Medication Sig Dispense Refill  . albuterol (PROVENTIL HFA;VENTOLIN HFA) 108 (90 Base) MCG/ACT inhaler Inhale 2 puffs into the lungs every 6 (six) hours as needed for wheezing or shortness of breath. 1 Inhaler 0  . amLODipine (NORVASC) 10 MG tablet Take 1 tablet (10 mg total) by mouth daily. 30 tablet 0  . carvedilol (COREG) 12.5 MG tablet Take 1 tablet (12.5 mg total) by mouth daily. 30 tablet 0  . cloNIDine (CATAPRES) 0.2 MG tablet Take 1 tablet (0.2 mg total) by mouth 2 (two) times daily. 60 tablet 0  . furosemide (LASIX) 40 MG tablet Take 1 tablet (40 mg total) by mouth daily. 30 tablet 0  . hydrOXYzine (ATARAX/VISTARIL) 50 MG tablet Take 1 tablet (50 mg total) by mouth every 8 (eight) hours as  needed for anxiety. 30 tablet 0  . Multiple Vitamin (MULTIVITAMIN) tablet Take 1 tablet by mouth daily.    . QUEtiapine (SEROQUEL) 200 MG tablet Take 1 tablet (200 mg total) by mouth at bedtime. 30 tablet 0  . sertraline (ZOLOFT) 100 MG tablet Take 1 tablet (100 mg total) by mouth daily. 30 tablet 0  . sildenafil (VIAGRA) 100 MG tablet Take 100 mg by mouth as needed for erectile dysfunction.   11   No current facility-administered medications for this visit.    Review of Systems  Constitutional:  Constitutional negative. HENT: HENT negative.  Eyes: Eyes negative.  Respiratory: Respiratory negative.  Cardiovascular: Cardiovascular negative.  GI: Gastrointestinal negative.  Musculoskeletal: Musculoskeletal negative.  Skin: Skin negative.  Neurological: Neurological negative. Hematologic: Hematologic/lymphatic negative.  Psychiatric: Psychiatric negative.        Objective:   Vitals:   08/18/20 0814  BP: 126/68  Pulse: 83  Resp: 20  Temp: 97.6 F (36.4 C)  SpO2: 99%    Physical Exam HENT:     Head: Normocephalic.     Nose:     Comments: Wearing a mask Cardiovascular:  Rate and Rhythm: Normal rate.  Pulmonary:     Effort: Pulmonary effort is normal.  Abdominal:     General: Abdomen is flat.     Palpations: Abdomen is soft.  Musculoskeletal:        General: No swelling. Normal range of motion.     Cervical back: Normal range of motion and neck supple.  Skin:    General: Skin is warm and dry.     Capillary Refill: Capillary refill takes less than 2 seconds.  Neurological:     General: No focal deficit present.     Mental Status: He is alert.  Psychiatric:        Mood and Affect: Mood normal.        Behavior: Behavior normal.        Thought Content: Thought content normal.        Judgment: Judgment normal.     Data: +-----------------+-------------+----------+---------+  Right Cephalic  Diameter (cm)Depth (cm)Findings    +-----------------+-------------+----------+---------+  Shoulder       0.33               +-----------------+-------------+----------+---------+  Prox upper arm    0.31               +-----------------+-------------+----------+---------+  Mid upper arm    0.25        branching  +-----------------+-------------+----------+---------+  Dist upper arm    0.23               +-----------------+-------------+----------+---------+  Antecubital fossa  0.52               +-----------------+-------------+----------+---------+  Prox forearm     0.17               +-----------------+-------------+----------+---------+  Mid forearm     0.24               +-----------------+-------------+----------+---------+  Dist forearm     0.18               +-----------------+-------------+----------+---------+   +-----------------+-------------+----------+--------+  Right Basilic  Diameter (cm)Depth (cm)Findings  +-----------------+-------------+----------+--------+  Prox upper arm    0.49              +-----------------+-------------+----------+--------+  Mid upper arm    0.46              +-----------------+-------------+----------+--------+  Dist upper arm    0.52              +-----------------+-------------+----------+--------+  Antecubital fossa  0.40              +-----------------+-------------+----------+--------+   +-----------------+-------------+----------+--------+  Left Cephalic  Diameter (cm)Depth (cm)Findings  +-----------------+-------------+----------+--------+  Shoulder       0.24              +-----------------+-------------+----------+--------+  Prox upper arm    0.24               +-----------------+-------------+----------+--------+  Mid upper arm    0.26              +-----------------+-------------+----------+--------+  Dist upper arm    0.29              +-----------------+-------------+----------+--------+  Antecubital fossa  0.55              +-----------------+-------------+----------+--------+  Prox forearm     0.35              +-----------------+-------------+----------+--------+  Mid forearm     0.14              +-----------------+-------------+----------+--------+  Dist forearm     0.14              +-----------------+-------------+----------+--------+   +-----------------+-------------+----------+--------+  Left Basilic   Diameter (cm)Depth (cm)Findings  +-----------------+-------------+----------+--------+  Mid upper arm    0.46              +-----------------+-------------+----------+--------+  Dist upper arm    0.51              +-----------------+-------------+----------+--------+  Antecubital fossa  0.48              +-----------------+-------------+----------+--------+  Prox forearm     0.32              +-----------------+-------------+----------+--------+   Summary: Right: Patent and compressible cephalic and basilic veins.  Left: Patent and compressible cephalic and basilic veins.   Right Pre-Dialysis Findings:  +-----------------------+----------+--------------------+---------+--------  +  Location        PSV (cm/s)Intralum. Diam. (cm)Waveform  Comments  +-----------------------+----------+--------------------+---------+--------  +  Brachial Antecub. fossa63    0.54        triphasic        +-----------------------+----------+--------------------+---------+--------  +  Radial Art at Wrist   63    0.28        triphasic        +-----------------------+----------+--------------------+---------+--------  +  Ulnar Art at Wrist   63    0.25        triphasic        +-----------------------+----------+--------------------+---------+--------  +      Left Pre-Dialysis Findings:  +-----------------------+----------+--------------------+---------+--------  +  Location        PSV (cm/s)Intralum. Diam. (cm)Waveform  Comments  +-----------------------+----------+--------------------+---------+--------  +  Brachial Antecub. fossa78    0.49        triphasic        +-----------------------+----------+--------------------+---------+--------  +  Radial Art at Wrist  62    0.28        triphasic        +-----------------------+----------+--------------------+---------+--------  +  Ulnar Art at Wrist   44    0.19        triphasic        +-----------------------+----------+--------------------+---------+--------  +      Assessment/Plan:   54 year old male right-hand-dominant on dialysis via catheter.  Plan will be for left upper extremity AV fistula versus graft.  We discussed the options being cephalic versus basilic versus need for graft.  He demonstrates good understanding.  We also discussed the risk and benefits he also demonstrates good understanding.  plan for OR today.     Waynetta Sandy MD [facility]

## 2020-08-19 ENCOUNTER — Encounter (HOSPITAL_COMMUNITY): Payer: Self-pay | Admitting: Vascular Surgery

## 2020-08-20 ENCOUNTER — Encounter (HOSPITAL_COMMUNITY): Payer: Non-veteran care

## 2020-09-07 ENCOUNTER — Other Ambulatory Visit: Payer: Self-pay | Admitting: *Deleted

## 2020-09-07 DIAGNOSIS — N186 End stage renal disease: Secondary | ICD-10-CM

## 2020-09-24 ENCOUNTER — Encounter (HOSPITAL_COMMUNITY): Payer: Non-veteran care

## 2020-10-05 ENCOUNTER — Encounter (HOSPITAL_COMMUNITY): Payer: Self-pay | Admitting: *Deleted

## 2020-10-05 ENCOUNTER — Emergency Department (HOSPITAL_COMMUNITY)
Admission: EM | Admit: 2020-10-05 | Discharge: 2020-10-06 | Disposition: A | Discharge status: Home / Self Care | Payer: No Typology Code available for payment source | Attending: Emergency Medicine | Admitting: Emergency Medicine

## 2020-10-05 ENCOUNTER — Other Ambulatory Visit: Payer: Self-pay

## 2020-10-05 DIAGNOSIS — R197 Diarrhea, unspecified: Secondary | ICD-10-CM | POA: Insufficient documentation

## 2020-10-05 DIAGNOSIS — R1110 Vomiting, unspecified: Secondary | ICD-10-CM | POA: Insufficient documentation

## 2020-10-05 DIAGNOSIS — Z5321 Procedure and treatment not carried out due to patient leaving prior to being seen by health care provider: Secondary | ICD-10-CM | POA: Insufficient documentation

## 2020-10-05 LAB — COMPREHENSIVE METABOLIC PANEL
ALT: 25 U/L (ref 0–44)
AST: 22 U/L (ref 15–41)
Albumin: 3.9 g/dL (ref 3.5–5.0)
Alkaline Phosphatase: 65 U/L (ref 38–126)
Anion gap: 18 — ABNORMAL HIGH (ref 5–15)
BUN: 35 mg/dL — ABNORMAL HIGH (ref 6–20)
CO2: 26 mmol/L (ref 22–32)
Calcium: 8.4 mg/dL — ABNORMAL LOW (ref 8.9–10.3)
Chloride: 91 mmol/L — ABNORMAL LOW (ref 98–111)
Creatinine, Ser: 11.73 mg/dL — ABNORMAL HIGH (ref 0.61–1.24)
GFR, Estimated: 5 mL/min — ABNORMAL LOW (ref >60)
Glucose, Bld: 104 mg/dL — ABNORMAL HIGH (ref 70–99)
Potassium: 4.6 mmol/L (ref 3.5–5.1)
Sodium: 135 mmol/L (ref 135–145)
Total Bilirubin: 0.6 mg/dL (ref 0.3–1.2)
Total Protein: 8.5 g/dL — ABNORMAL HIGH (ref 6.5–8.1)

## 2020-10-05 LAB — CBC
HCT: 31.6 % — ABNORMAL LOW (ref 39.0–52.0)
Hemoglobin: 9.9 g/dL — ABNORMAL LOW (ref 13.0–17.0)
MCH: 29.6 pg (ref 26.0–34.0)
MCHC: 31.3 g/dL (ref 30.0–36.0)
MCV: 94.3 fL (ref 80.0–100.0)
Platelets: 401 10*3/uL — ABNORMAL HIGH (ref 150–400)
RBC: 3.35 MIL/uL — ABNORMAL LOW (ref 4.22–5.81)
RDW: 18.9 % — ABNORMAL HIGH (ref 11.5–15.5)
WBC: 6.5 10*3/uL (ref 4.0–10.5)
nRBC: 0 % (ref 0.0–0.2)

## 2020-10-05 LAB — LIPASE, BLOOD: Lipase: 46 U/L (ref 11–51)

## 2020-10-05 NOTE — ED Triage Notes (Signed)
Pt says that he went to dialysis today, after returning he had 2 episodes of vomiting and 1 episode of diarrhea.

## 2020-10-06 NOTE — ED Notes (Signed)
Patient asked if he would get a bed soon. Explained wait time and acuity. Patient asked if he had covid would he be seen faster. Assured patient his care had been started and they room people according to acuity and lab work. This NT heard the patient call someone and ask them to come get him because we were doing nothing for him

## 2020-10-18 ENCOUNTER — Other Ambulatory Visit: Payer: Self-pay

## 2020-10-18 DIAGNOSIS — N186 End stage renal disease: Secondary | ICD-10-CM

## 2020-11-10 ENCOUNTER — Other Ambulatory Visit: Payer: Self-pay

## 2020-11-10 ENCOUNTER — Ambulatory Visit (INDEPENDENT_AMBULATORY_CARE_PROVIDER_SITE_OTHER): Payer: No Typology Code available for payment source | Admitting: Physician Assistant

## 2020-11-10 ENCOUNTER — Ambulatory Visit (HOSPITAL_COMMUNITY)
Admission: RE | Admit: 2020-11-10 | Discharge: 2020-11-10 | Disposition: A | Discharge status: Home / Self Care | Payer: No Typology Code available for payment source | Source: Ambulatory Visit | Attending: Vascular Surgery | Admitting: Vascular Surgery

## 2020-11-10 VITALS — BP 154/96 | HR 75 | Temp 98.5°F | Resp 20 | Ht 71.0 in | Wt 240.6 lb

## 2020-11-10 DIAGNOSIS — N186 End stage renal disease: Secondary | ICD-10-CM

## 2020-11-10 MED ORDER — SODIUM CHLORIDE 0.9 % IV SOLN: 250.0000 mL | INTRAVENOUS | Status: AC | PRN

## 2020-11-10 MED ORDER — SODIUM CHLORIDE 0.9% FLUSH: 3.0000 mL | Freq: Two times a day (BID) | INTRAVENOUS | Status: AC

## 2020-11-10 MED ORDER — SODIUM CHLORIDE 0.9% FLUSH: 3.0000 mL | INTRAVENOUS | Status: AC | PRN

## 2020-11-10 NOTE — Progress Notes (Signed)
  POST OPERATIVE DIALYSIS ACCESS OFFICE NOTE    CC:  F/u for dialysis access surgery  HPI:  This is a 55 y.o. male who is s/p left brachiocephalic arteriovenous fistula by Dr. Donzetta Matters on August 18, 2020.  The patient dialyzes Tuesdays, Thursdays and Saturdays via right tunneled hemodialysis catheter.  He denies fever, chills, hand pain or numbness.  Dialysis days:  TTS   Dialysis center:  [facility]  No Known Allergies  Current Outpatient Medications  Medication Sig Dispense Refill  . albuterol (PROVENTIL HFA;VENTOLIN HFA) 108 (90 Base) MCG/ACT inhaler Inhale 2 puffs into the lungs every 6 (six) hours as needed for wheezing or shortness of breath. 1 Inhaler 0  . amLODipine (NORVASC) 10 MG tablet Take 1 tablet (10 mg total) by mouth daily. 30 tablet 0  . carvedilol (COREG) 12.5 MG tablet Take 1 tablet (12.5 mg total) by mouth daily. 30 tablet 0  . cloNIDine (CATAPRES) 0.2 MG tablet Take 1 tablet (0.2 mg total) by mouth 2 (two) times daily. 60 tablet 0  . furosemide (LASIX) 40 MG tablet Take 1 tablet (40 mg total) by mouth daily. 30 tablet 0  . hydrOXYzine (ATARAX/VISTARIL) 50 MG tablet Take 1 tablet (50 mg total) by mouth every 8 (eight) hours as needed for anxiety. 30 tablet 0  . Multiple Vitamin (MULTIVITAMIN) tablet Take 1 tablet by mouth daily.    Marland Kitchen oxyCODONE-acetaminophen (PERCOCET) 5-325 MG tablet Take 1 tablet by mouth every 4 (four) hours as needed for severe pain. 6 tablet 0  . QUEtiapine (SEROQUEL) 200 MG tablet Take 1 tablet (200 mg total) by mouth at bedtime. 30 tablet 0  . sertraline (ZOLOFT) 100 MG tablet Take 1 tablet (100 mg total) by mouth daily. 30 tablet 0  . sildenafil (VIAGRA) 100 MG tablet Take 100 mg by mouth as needed for erectile dysfunction.   11   Current Facility-Administered Medications  Medication Dose Route Frequency Provider Last Rate Last Admin  . ceFAZolin (ANCEF) 1 g in dextrose 5 % 100 mL IVPB  1 g Intravenous Once Waynetta Sandy, MD          ROS:  See HPI  BP (!) 154/96 (BP Location: Right Arm, Patient Position: Sitting, Cuff Size: Large)   Pulse 75   Temp 98.5 F (36.9 C) (Temporal)   Resp 20   Ht '5\' 11"'$  (1.803 m)   Wt 240 lb 9.6 oz (109.1 kg)   SpO2 97%   BMI 33.56 kg/m    Physical Exam:  General appearance: WD, WN in NAD Cardiac: RRR Respiratory:non-labored Incision:  Well healed Extremities:  Left hand is warm with 5/5 hand grip strength. Sensation is intact. 2+ radial pulse. Good bruit and thrill in fistula  Dialysis duplex on 11/10/2020  Reviewed with Dr. Scot Dock. Change in diameter at the proximal upper arm and at the [facility] fossa.  Competing branches at distal and proximal upper arm.   Assessment/Plan:   -pt does not have evidence of steal syndrome -dialysis duplex today reveals fistula to be of adequate diameter but deeper along the proximal upper arm and is not readily palpable -recommend fistulogram prior to fistula cannulation. We will arrange this with Dr. Scot Dock on 11/19/2020. Continue TDC access for now.   Barbie Banner, PA-C 11/10/2020 2:54 PM [facility] [phone]  Clinic MD:  Scot Dock

## 2020-11-18 ENCOUNTER — Inpatient Hospital Stay (HOSPITAL_COMMUNITY): Admission: RE | Admit: 2020-11-18 | Payer: No Typology Code available for payment source | Source: Ambulatory Visit

## 2020-11-19 ENCOUNTER — Encounter (HOSPITAL_COMMUNITY): Admission: RE | Payer: Self-pay | Source: Home / Self Care

## 2020-11-19 ENCOUNTER — Ambulatory Visit (HOSPITAL_COMMUNITY)
Admission: RE | Admit: 2020-11-19 | Payer: No Typology Code available for payment source | Source: Home / Self Care | Admitting: Vascular Surgery

## 2020-11-19 SURGERY — A/V FISTULAGRAM
Anesthesia: LOCAL | Laterality: Left

## 2020-12-17 ENCOUNTER — Other Ambulatory Visit: Payer: Self-pay

## 2020-12-28 ENCOUNTER — Other Ambulatory Visit (HOSPITAL_COMMUNITY)
Admission: RE | Admit: 2020-12-28 | Discharge: 2020-12-28 | Disposition: A | Discharge status: Home / Self Care | Payer: No Typology Code available for payment source | Source: Ambulatory Visit | Attending: Vascular Surgery | Admitting: Vascular Surgery

## 2020-12-28 DIAGNOSIS — Z01812 Encounter for preprocedural laboratory examination: Secondary | ICD-10-CM | POA: Insufficient documentation

## 2020-12-28 DIAGNOSIS — Z20822 Contact with and (suspected) exposure to covid-19: Secondary | ICD-10-CM | POA: Diagnosis not present

## 2020-12-28 LAB — SARS CORONAVIRUS 2 (TAT 6-24 HRS): SARS Coronavirus 2: NEGATIVE

## 2020-12-29 ENCOUNTER — Ambulatory Visit (HOSPITAL_COMMUNITY)
Admission: RE | Admit: 2020-12-29 | Discharge: 2020-12-29 | Disposition: A | Discharge status: Home / Self Care | Payer: No Typology Code available for payment source | Attending: Vascular Surgery | Admitting: Vascular Surgery

## 2020-12-29 ENCOUNTER — Other Ambulatory Visit: Payer: Self-pay

## 2020-12-29 ENCOUNTER — Encounter (HOSPITAL_COMMUNITY): Payer: Self-pay | Admitting: Vascular Surgery

## 2020-12-29 ENCOUNTER — Encounter (HOSPITAL_COMMUNITY)
Admission: RE | Disposition: A | Discharge status: Home / Self Care | Payer: Self-pay | Source: Home / Self Care | Attending: Vascular Surgery

## 2020-12-29 DIAGNOSIS — N186 End stage renal disease: Secondary | ICD-10-CM | POA: Insufficient documentation

## 2020-12-29 DIAGNOSIS — Z992 Dependence on renal dialysis: Secondary | ICD-10-CM | POA: Diagnosis not present

## 2020-12-29 DIAGNOSIS — N185 Chronic kidney disease, stage 5: Secondary | ICD-10-CM | POA: Diagnosis not present

## 2020-12-29 DIAGNOSIS — T82898A Other specified complication of vascular prosthetic devices, implants and grafts, initial encounter: Secondary | ICD-10-CM

## 2020-12-29 HISTORY — PX: A/V FISTULAGRAM: CATH118298

## 2020-12-29 LAB — POCT I-STAT, CHEM 8
BUN: 51 mg/dL — ABNORMAL HIGH (ref 6–20)
Calcium, Ion: 0.99 mmol/L — ABNORMAL LOW (ref 1.15–1.40)
Chloride: 96 mmol/L — ABNORMAL LOW (ref 98–111)
Creatinine, Ser: 14.1 mg/dL — ABNORMAL HIGH (ref 0.61–1.24)
Glucose, Bld: 102 mg/dL — ABNORMAL HIGH (ref 70–99)
HCT: 37 % — ABNORMAL LOW (ref 39.0–52.0)
Hemoglobin: 12.6 g/dL — ABNORMAL LOW (ref 13.0–17.0)
Potassium: 4.8 mmol/L (ref 3.5–5.1)
Sodium: 138 mmol/L (ref 135–145)
TCO2: 29 mmol/L (ref 22–32)

## 2020-12-29 SURGERY — A/V FISTULAGRAM
Anesthesia: LOCAL

## 2020-12-29 MED ORDER — LIDOCAINE HCL (PF) 1 % IJ SOLN
INTRAMUSCULAR | Status: AC
  Filled 2020-12-29: qty 30

## 2020-12-29 MED ORDER — LIDOCAINE HCL (PF) 1 % IJ SOLN
INTRAMUSCULAR | Status: DC | PRN
  Administered 2020-12-29: 2 mL

## 2020-12-29 MED ORDER — HEPARIN (PORCINE) IN NACL 1000-0.9 UT/500ML-% IV SOLN
INTRAVENOUS | Status: DC | PRN
  Administered 2020-12-29: 500 mL

## 2020-12-29 MED ORDER — IODIXANOL 320 MG/ML IV SOLN
INTRAVENOUS | Status: DC | PRN
  Administered 2020-12-29: 15 mL

## 2020-12-29 MED ORDER — HEPARIN (PORCINE) IN NACL 1000-0.9 UT/500ML-% IV SOLN
INTRAVENOUS | Status: AC
  Filled 2020-12-29: qty 1000

## 2020-12-29 SURGICAL SUPPLY — 9 items
BAG SNAP BAND KOVER 36X36 (MISCELLANEOUS) ×2 IMPLANT
COVER DOME SNAP 22 D (MISCELLANEOUS) ×2 IMPLANT
KIT MICROPUNCTURE NIT STIFF (SHEATH) ×2 IMPLANT
PROTECTION STATION PRESSURIZED (MISCELLANEOUS) ×2
SHEATH PROBE COVER 6X72 (BAG) ×2 IMPLANT
STATION PROTECTION PRESSURIZED (MISCELLANEOUS) ×1 IMPLANT
STOPCOCK MORSE 400PSI 3WAY (MISCELLANEOUS) ×2 IMPLANT
TRAY PV CATH (CUSTOM PROCEDURE TRAY) ×2 IMPLANT
TUBING CIL FLEX 10 FLL-RA (TUBING) ×2 IMPLANT

## 2020-12-29 NOTE — H&P (Signed)
  POST OPERATIVE DIALYSIS ACCESS OFFICE NOTE    CC:  F/u for dialysis access surgery  HPI:  This is a 55 y.o. male who is s/p left brachiocephalic arteriovenous fistula by Dr. Donzetta Matters on August 18, 2020.  The patient dialyzes Tuesdays, Thursdays and Saturdays via right tunneled hemodialysis catheter.  He denies fever, chills, hand pain or numbness.  Dialysis days:  TTS   Dialysis center:  [facility]  No Known Allergies  No current facility-administered medications for this encounter.     ROS:  See HPI  BP 117/80   Pulse 85   Temp 97.6 F (36.4 C) (Oral)   Ht '5\' 11"'$  (1.803 m)   Wt 106.6 kg   SpO2 98%   BMI 32.78 kg/m    Physical Exam:  General appearance: WD, WN in NAD Cardiac: RRR Respiratory:non-labored Incision:  Well healed Extremities:  Left hand is warm with 5/5 hand grip strength. Sensation is intact. 2+ radial pulse. Good bruit and thrill in fistula  Dialysis duplex on 12/29/2020  Reviewed with Dr. Scot Dock. Change in diameter at the proximal upper arm and at the [facility] fossa.  Competing branches at distal and proximal upper arm.   Assessment/Plan:   -pt does not have evidence of steal syndrome -dialysis duplex today reveals fistula to be of adequate diameter but deeper along the proximal upper arm and is not readily palpable -recommend fistulogram prior to fistula cannulation. We will arrange this with Dr. Scot Dock on 11/19/2020. Continue TDC access for now.   Cherre Robins, MD 12/29/2020 11:11 AM [facility] [phone]

## 2020-12-29 NOTE — Op Note (Signed)
DATE OF SERVICE: 12/29/2020  PATIENT:  Gabriel Ashley  55 y.o. male  PRE-OPERATIVE DIAGNOSIS:  ESRD. Status post left brachiocephalic arteriovenous fistula creation which has been slow to mature  POST-OPERATIVE DIAGNOSIS:  Same  PROCEDURE:   1) US guided left arteriovenous fistula access 2) left upper extremity fistulagram (27m contrast)  SURGEON:  Surgeon(s) and Role:    * HCherre Robins MD - Primary  ASSISTANT: none  An assistant was required to facilitate exposure and expedite the case.  ANESTHESIA:   local  EBL: min  BLOOD ADMINISTERED:none  DRAINS: none   LOCAL MEDICATIONS USED:  LIDOCAINE   SPECIMEN:  none  COUNTS: confirmed correct .  TOURNIQUET:  None  PATIENT DISPOSITION:  PACU - hemodynamically stable.   Delay start of Pharmacological VTE agent (>24hrs) due to surgical blood loss or risk of bleeding: no  INDICATION FOR PROCEDURE: Gabriel Leineris a 55y.o. male with ESRD dialyzing via a right internal jugular tunneled dialysis catheter.  He had a left brachiocephalic AV fistula created August 18, 2020.  This is been slow to mature.  Duplex ultrasound suggested a deep fistula and the presence of sidebranches limiting the flow through the fistula. After careful discussion of risks, benefits, and alternatives the patient was offered fistulagram. The patient understood and wished to proceed.  OPERATIVE FINDINGS:  Large side branches in the brachiocephalic AV fistula.  No flow-limiting stenosis.  DESCRIPTION OF PROCEDURE: After identification of the patient in the pre-operative holding area, the patient was transferred to the operating room. The patient was positioned supine on the operating room table. Anesthesia was induced. The groins was prepped and draped in standard fashion. A surgical pause was performed confirming correct patient, procedure, and operative location.  Using ultrasound guidance, the peripheral aspect of the arteriovenous fistula was accessed  with micropuncture technique.  Micropuncture sheath was introduced into the fistula.  Fistulogram was performed in several stations.  This confirmed presence of 2 large sidebranches in the mid arm.  The fistula was otherwise unremarkable.  We knew the case here.  A figure-of-eight stitch was placed around the access site.  The access was removed.  The stitch was secured.  Hemostasis was achieved.  A bandage was applied.  Upon completion of the case instrument and sharps counts were confirmed correct. The patient was transferred to the PACU in good condition. I was present for all portions of the procedure.  PLAN: Left upper extremity arteriovenous fistula sidebranch ligation and possible elevation in OR next week with next available surgeon.  Gabriel Aline HStanford Breed MD [facility] Number: [phone]/13/2022 11:46 AM

## 2020-12-29 NOTE — Discharge Instructions (Signed)
Dialysis Fistulogram, Care After The following information offers guidance on how to care for yourself after your procedure. Your health care provider may also give you more specific instructions. If you have problems or questions, contact your health care provider. What can I expect after the procedure? After the procedure, it is common to have:  A small amount of discomfort in the area where the small tube (catheter) was placed for the procedure.  A small amount of bruising around the fistula.  Sleepiness and tiredness (fatigue). Follow these instructions at home: Puncture site care  Follow instructions from your health care provider about how to take care of the site where catheters were inserted. Make sure you: ? Wash your hands with soap and water for at least 20 seconds before and after you change your bandage (dressing). If soap and water are not available, use hand sanitizer. ? Change your dressing as told by your health care provider. ? Leave stitches (sutures), skin glue, or adhesive strips in place. These skin closures may need to stay in place for 2 weeks or longer. If adhesive strip edges start to loosen and curl up, you may trim the loose edges. Do not remove adhesive strips completely unless your health care provider tells you to do that.  Check your puncture area every day for signs of infection. Check for: ? More redness, swelling, or pain. ? Fluid or blood. ? Warmth. ? Pus or a bad smell.   Activity  Rest as much as you can.  If you were given a sedative during the procedure, it can affect you for several hours. Do not drive or operate machinery until your health care provider says that it is safe.  Do not lift anything that is heavier than 5 lb (2.3 kg), or the limit that you are told, on the day of your procedure.  Do not do anything strenuous with your arm for the rest of the day. Avoid household activities, such as vacuuming.  Return to your normal activities as  told by your health care provider. Ask your health care provider what activities are safe for you. Safety To prevent damage to your graft or fistula:  Do not wear tight-fitting clothing or jewelry on the arm or leg that has your graft or fistula.  Tell all your health care providers that you have a dialysis fistula or graft.  Do not allow blood draws, IVs, or blood pressure readings to be done in the arm that has your fistula or graft.  Do not allow flu shots or vaccinations in the arm with your fistula or graft. General instructions  Take over-the-counter and prescription medicines only as told by your health care provider.  Do not take baths, swim, or use a hot tub until your health care provider approves. Ask your health care provider if you may take showers. You may only be allowed to take sponge baths.  Monitor your dialysis fistula closely. Check to make sure that you can feel a vibration or buzz (a thrill) when you put your fingers over the fistula.  Keep all follow-up visits. This is important. Contact a health care provider if:  You have more redness, swelling, or pain at the site where the catheter was put in.  You have fluid or blood coming from the catheter site.  You have pus or a bad smell coming from the catheter site.  Your catheter site feels warm.  You have a fever or chills. Get help right away if:    You have bleeding from the vascular access site that does not stop.  You feel weak.  You have trouble balancing.  You have trouble moving your arms or legs.  You have problems with your speech or vision.  You can no longer feel a vibration or buzz when you put your fingers over your fistula.  The limb that was used for the procedure swells or becomes painful, cold, blue, or pale white.  You have chest pain or shortness of breath. These symptoms may represent a serious problem that is an emergency. Do not wait to see if the symptoms will go away. Get  medical help right away. Call your local emergency services (911 in the [place] Do not drive yourself to the hospital. Summary  After a dialysis fistulogram, it is common to have a small amount of discomfort or bruising in the area where the small, thin tube (catheter) was placed.  Rest as much as you can after your procedure. Return to your normal activities as told by your health care provider.  Take over-the-counter and prescription medicines only as told by your health care provider.  Follow instructions from your health care provider about how to take care of the site where the catheter was inserted.  Keep all follow-up visits. This is important. This information is not intended to replace advice given to you by your health care provider. Make sure you discuss any questions you have with your health care provider. Document Revised: 04/14/2020 Document Reviewed: 04/14/2020 Elsevier Patient Education  2021 [employer]  

## 2020-12-29 NOTE — H&P (View-Only) (Signed)
  POST OPERATIVE DIALYSIS ACCESS OFFICE NOTE    CC:  F/u for dialysis access surgery  HPI:  This is a 55 y.o. male who is s/p left brachiocephalic arteriovenous fistula by Dr. Donzetta Matters on August 18, 2020.  The patient dialyzes Tuesdays, Thursdays and Saturdays via right tunneled hemodialysis catheter.  He denies fever, chills, hand pain or numbness.  Dialysis days:  TTS   Dialysis center:  [facility]  No Known Allergies  No current facility-administered medications for this encounter.     ROS:  See HPI  BP 117/80   Pulse 85   Temp 97.6 F (36.4 C) (Oral)   Ht '5\' 11"'$  (1.803 m)   Wt 106.6 kg   SpO2 98%   BMI 32.78 kg/m    Physical Exam:  General appearance: WD, WN in NAD Cardiac: RRR Respiratory:non-labored Incision:  Well healed Extremities:  Left hand is warm with 5/5 hand grip strength. Sensation is intact. 2+ radial pulse. Good bruit and thrill in fistula  Dialysis duplex on 12/29/2020  Reviewed with Dr. Scot Dock. Change in diameter at the proximal upper arm and at the [facility] fossa.  Competing branches at distal and proximal upper arm.   Assessment/Plan:   -pt does not have evidence of steal syndrome -dialysis duplex today reveals fistula to be of adequate diameter but deeper along the proximal upper arm and is not readily palpable -recommend fistulogram prior to fistula cannulation. We will arrange this with Dr. Scot Dock on 11/19/2020. Continue TDC access for now.   Cherre Robins, MD 12/29/2020 11:11 AM [facility] [phone]

## 2021-01-06 ENCOUNTER — Other Ambulatory Visit: Payer: Self-pay

## 2021-01-07 ENCOUNTER — Encounter (HOSPITAL_COMMUNITY): Payer: Self-pay | Admitting: Vascular Surgery

## 2021-01-07 ENCOUNTER — Other Ambulatory Visit (HOSPITAL_COMMUNITY)
Admission: RE | Admit: 2021-01-07 | Discharge: 2021-01-07 | Disposition: A | Discharge status: Home / Self Care | Payer: No Typology Code available for payment source | Source: Ambulatory Visit | Attending: Vascular Surgery | Admitting: Vascular Surgery

## 2021-01-07 DIAGNOSIS — Z20822 Contact with and (suspected) exposure to covid-19: Secondary | ICD-10-CM | POA: Diagnosis not present

## 2021-01-07 DIAGNOSIS — Z01812 Encounter for preprocedural laboratory examination: Secondary | ICD-10-CM | POA: Diagnosis present

## 2021-01-07 NOTE — Progress Notes (Signed)
Spoke with pt for pre-op call. Pt states he's had CHF in the past, no recent chest pain or shortness of breath. Pt is not diabetic.   Covid test done today, result pending. Pt states he's been in quarantine since the test was done and understands that he stays in quarantine until he comes to the hospital on Monday. He will go to dialysis on Saturday.

## 2021-01-08 LAB — SARS CORONAVIRUS 2 (TAT 6-24 HRS): SARS Coronavirus 2: NEGATIVE

## 2021-01-09 NOTE — Anesthesia Preprocedure Evaluation (Addendum)
Anesthesia Evaluation  Patient identified by MRN, date of birth, ID band Patient awake    Reviewed: Allergy & Precautions, NPO status , Patient's Chart, lab work & pertinent test results  History of Anesthesia Complications Negative for: history of anesthetic complications  Airway Mallampati: III  TM Distance: >3 FB Neck ROM: Full    Dental  (+) Dental Advisory Given, Teeth Intact   Pulmonary asthma , sleep apnea and Continuous Positive Airway Pressure Ventilation , Current Smoker and Patient abstained from smoking.,    Pulmonary exam normal        Cardiovascular hypertension, Pt. on home beta blockers and Pt. on medications +CHF  Normal cardiovascular exam+ Valvular Problems/Murmurs MR    '18 TTE - LV was mildly dilated. Mild concentric hypertrophy. EF of 40% to 45%. There is hypokinesis of the entire myocardium. Grade 1 diastolic dysfunction. Mild AI. Severe MR. Severely dilated LA. RV systolic function was mildly reduced. RA was moderately dilated. Moderate TR. PASP was mildly to moderately increased. PA peak pressure: 48 mm Hg     Neuro/Psych PSYCHIATRIC DISORDERS Anxiety Depression  Hx suicidal and homicidal ideation negative neurological ROS     GI/Hepatic negative GI ROS, (+)     substance abuse  cocaine use and marijuana use,   Endo/Other   Obesity   Renal/GU ESRF and DialysisRenal disease     Musculoskeletal negative musculoskeletal ROS (+)   Abdominal   Peds  Hematology  (+) anemia ,   Anesthesia Other Findings Covid test negative   Reproductive/Obstetrics                            Anesthesia Physical Anesthesia Plan  ASA: IV  Anesthesia Plan: Regional   Post-op Pain Management:    Induction: Intravenous  PONV Risk Score and Plan: 1 and Propofol infusion and Treatment may vary due to age or medical condition  Airway Management Planned: Natural Airway and Simple  Face Mask  Additional Equipment: None  Intra-op Plan:   Post-operative Plan:   Informed Consent: I have reviewed the patients History and Physical, chart, labs and discussed the procedure including the risks, benefits and alternatives for the proposed anesthesia with the patient or authorized representative who has indicated his/her understanding and acceptance.       Plan Discussed with: CRNA and Anesthesiologist  Anesthesia Plan Comments:        Anesthesia Quick Evaluation

## 2021-01-10 ENCOUNTER — Ambulatory Visit (HOSPITAL_COMMUNITY)
Admission: RE | Admit: 2021-01-10 | Discharge: 2021-01-10 | Disposition: A | Discharge status: Home / Self Care | Payer: No Typology Code available for payment source | Attending: Vascular Surgery | Admitting: Vascular Surgery

## 2021-01-10 ENCOUNTER — Encounter (HOSPITAL_COMMUNITY)
Admission: RE | Disposition: A | Discharge status: Home / Self Care | Payer: Self-pay | Source: Home / Self Care | Attending: Vascular Surgery

## 2021-01-10 ENCOUNTER — Other Ambulatory Visit: Payer: Self-pay

## 2021-01-10 ENCOUNTER — Ambulatory Visit (HOSPITAL_COMMUNITY): Payer: No Typology Code available for payment source | Admitting: Anesthesiology

## 2021-01-10 DIAGNOSIS — T82898A Other specified complication of vascular prosthetic devices, implants and grafts, initial encounter: Secondary | ICD-10-CM

## 2021-01-10 DIAGNOSIS — N189 Chronic kidney disease, unspecified: Secondary | ICD-10-CM

## 2021-01-10 DIAGNOSIS — Z992 Dependence on renal dialysis: Secondary | ICD-10-CM | POA: Insufficient documentation

## 2021-01-10 DIAGNOSIS — N186 End stage renal disease: Secondary | ICD-10-CM | POA: Diagnosis not present

## 2021-01-10 DIAGNOSIS — Y832 Surgical operation with anastomosis, bypass or graft as the cause of abnormal reaction of the patient, or of later complication, without mention of misadventure at the time of the procedure: Secondary | ICD-10-CM | POA: Diagnosis not present

## 2021-01-10 DIAGNOSIS — N185 Chronic kidney disease, stage 5: Secondary | ICD-10-CM | POA: Diagnosis not present

## 2021-01-10 HISTORY — PX: FISTULA SUPERFICIALIZATION: SHX6341

## 2021-01-10 HISTORY — DX: Pneumonia, unspecified organism: J18.9

## 2021-01-10 HISTORY — PX: LIGATION OF COMPETING BRANCHES OF ARTERIOVENOUS FISTULA: SHX5949

## 2021-01-10 LAB — POCT I-STAT, CHEM 8
BUN: 68 mg/dL — ABNORMAL HIGH (ref 6–20)
Calcium, Ion: 0.96 mmol/L — ABNORMAL LOW (ref 1.15–1.40)
Chloride: 98 mmol/L (ref 98–111)
Creatinine, Ser: 16.4 mg/dL — ABNORMAL HIGH (ref 0.61–1.24)
Glucose, Bld: 90 mg/dL (ref 70–99)
HCT: 33 % — ABNORMAL LOW (ref 39.0–52.0)
Hemoglobin: 11.2 g/dL — ABNORMAL LOW (ref 13.0–17.0)
Potassium: 5.3 mmol/L — ABNORMAL HIGH (ref 3.5–5.1)
Sodium: 134 mmol/L — ABNORMAL LOW (ref 135–145)
TCO2: 22 mmol/L (ref 22–32)

## 2021-01-10 SURGERY — LIGATION OF COMPETING BRANCHES OF ARTERIOVENOUS FISTULA
Anesthesia: Monitor Anesthesia Care | Laterality: Left

## 2021-01-10 MED ORDER — LIDOCAINE-EPINEPHRINE (PF) 1 %-1:200000 IJ SOLN
INTRAMUSCULAR | Status: AC
  Filled 2021-01-10: qty 30

## 2021-01-10 MED ORDER — PHENYLEPHRINE 40 MCG/ML (10ML) SYRINGE FOR IV PUSH (FOR BLOOD PRESSURE SUPPORT)
PREFILLED_SYRINGE | INTRAVENOUS | Status: DC | PRN
  Administered 2021-01-10: 120 ug via INTRAVENOUS

## 2021-01-10 MED ORDER — LIDOCAINE-EPINEPHRINE (PF) 1.5 %-1:200000 IJ SOLN
INTRAMUSCULAR | Status: DC | PRN
  Administered 2021-01-10: 25 mL via PERINEURAL

## 2021-01-10 MED ORDER — CHLORHEXIDINE GLUCONATE 4 % EX LIQD: 60.0000 mL | Freq: Once | CUTANEOUS | Status: DC

## 2021-01-10 MED ORDER — OXYCODONE HCL 5 MG/5ML PO SOLN: 5.0000 mg | Freq: Once | ORAL | Status: DC | PRN

## 2021-01-10 MED ORDER — CHLORHEXIDINE GLUCONATE 0.12 % MT SOLN: 15.0000 mL | Freq: Once | OROMUCOSAL | Status: AC

## 2021-01-10 MED ORDER — FENTANYL CITRATE (PF) 100 MCG/2ML IJ SOLN: 25.0000 ug | INTRAMUSCULAR | Status: DC | PRN

## 2021-01-10 MED ORDER — OXYCODONE HCL 5 MG PO TABS: 5.0000 mg | ORAL_TABLET | Freq: Once | ORAL | Status: DC | PRN

## 2021-01-10 MED ORDER — DEXMEDETOMIDINE (PRECEDEX) IN NS 20 MCG/5ML (4 MCG/ML) IV SYRINGE
PREFILLED_SYRINGE | INTRAVENOUS | Status: DC | PRN
  Administered 2021-01-10: 8 ug via INTRAVENOUS
  Administered 2021-01-10: 4 ug via INTRAVENOUS
  Administered 2021-01-10: 8 ug via INTRAVENOUS

## 2021-01-10 MED ORDER — CHLORHEXIDINE GLUCONATE 0.12 % MT SOLN
OROMUCOSAL | Status: AC
  Administered 2021-01-10: 15 mL via OROMUCOSAL
  Filled 2021-01-10: qty 15

## 2021-01-10 MED ORDER — CEFAZOLIN SODIUM-DEXTROSE 2-4 GM/100ML-% IV SOLN
2.0000 g | INTRAVENOUS | Status: AC
  Administered 2021-01-10: 2 g via INTRAVENOUS
  Filled 2021-01-10: qty 100

## 2021-01-10 MED ORDER — PROPOFOL 500 MG/50ML IV EMUL
INTRAVENOUS | Status: DC | PRN
  Administered 2021-01-10: 100 ug/kg/min via INTRAVENOUS

## 2021-01-10 MED ORDER — OXYCODONE-ACETAMINOPHEN 5-325 MG PO TABS: 1.0000 | ORAL_TABLET | Freq: Four times a day (QID) | ORAL | 0 refills | Status: AC | PRN

## 2021-01-10 MED ORDER — FENTANYL CITRATE (PF) 250 MCG/5ML IJ SOLN
INTRAMUSCULAR | Status: AC
  Filled 2021-01-10: qty 5

## 2021-01-10 MED ORDER — MIDAZOLAM HCL 2 MG/2ML IJ SOLN
INTRAMUSCULAR | Status: AC
  Filled 2021-01-10: qty 2

## 2021-01-10 MED ORDER — 0.9 % SODIUM CHLORIDE (POUR BTL) OPTIME
TOPICAL | Status: DC | PRN
  Administered 2021-01-10: 1000 mL

## 2021-01-10 MED ORDER — PROMETHAZINE HCL 25 MG/ML IJ SOLN: 6.2500 mg | INTRAMUSCULAR | Status: DC | PRN

## 2021-01-10 MED ORDER — LIDOCAINE-EPINEPHRINE (PF) 1 %-1:200000 IJ SOLN
INTRAMUSCULAR | Status: DC | PRN
  Administered 2021-01-10: 15 mL

## 2021-01-10 MED ORDER — MIDAZOLAM HCL 2 MG/2ML IJ SOLN
INTRAMUSCULAR | Status: DC | PRN
  Administered 2021-01-10 (×2): 1 mg via INTRAVENOUS

## 2021-01-10 MED ORDER — HEPARIN SODIUM (PORCINE) 1000 UNIT/ML IJ SOLN
1000.0000 [IU] | Freq: Once | INTRAMUSCULAR | Status: AC
  Administered 2021-01-10: 1000 [IU]

## 2021-01-10 MED ORDER — LIDOCAINE 2% (20 MG/ML) 5 ML SYRINGE
INTRAMUSCULAR | Status: DC | PRN
  Administered 2021-01-10: 60 mg via INTRAVENOUS

## 2021-01-10 MED ORDER — SODIUM CHLORIDE 0.9 % IV SOLN
INTRAVENOUS | Status: AC
  Filled 2021-01-10: qty 1.2

## 2021-01-10 MED ORDER — SODIUM CHLORIDE 0.9 % IV SOLN
INTRAVENOUS | Status: DC | PRN
  Administered 2021-01-10: 500 mL

## 2021-01-10 MED ORDER — PROPOFOL 10 MG/ML IV BOLUS
INTRAVENOUS | Status: DC | PRN
  Administered 2021-01-10: 20 mg via INTRAVENOUS

## 2021-01-10 MED ORDER — PROPOFOL 10 MG/ML IV BOLUS
INTRAVENOUS | Status: AC
  Filled 2021-01-10: qty 20

## 2021-01-10 MED ORDER — SODIUM CHLORIDE 0.9 % IV SOLN: INTRAVENOUS | Status: DC

## 2021-01-10 MED ORDER — PHENYLEPHRINE HCL-NACL 10-0.9 MG/250ML-% IV SOLN
INTRAVENOUS | Status: DC | PRN
  Administered 2021-01-10: 50 ug/min via INTRAVENOUS

## 2021-01-10 SURGICAL SUPPLY — 36 items
ARMBAND PINK RESTRICT EXTREMIT (MISCELLANEOUS) ×2 IMPLANT
BENZOIN TINCTURE PRP APPL 2/3 (GAUZE/BANDAGES/DRESSINGS) IMPLANT
CANISTER SUCT 3000ML PPV (MISCELLANEOUS) ×2 IMPLANT
CANNULA VESSEL 3MM 2 BLNT TIP (CANNULA) ×2 IMPLANT
CHLORAPREP W/TINT 26 (MISCELLANEOUS) ×2 IMPLANT
CLIP VESOCCLUDE MED 6/CT (CLIP) ×2 IMPLANT
CLIP VESOCCLUDE SM WIDE 6/CT (CLIP) ×2 IMPLANT
CLSR STERI-STRIP ANTIMIC 1/2X4 (GAUZE/BANDAGES/DRESSINGS) ×2 IMPLANT
COVER PROBE W GEL 5X96 (DRAPES) ×2 IMPLANT
COVER WAND RF STERILE (DRAPES) IMPLANT
DERMABOND ADVANCED (GAUZE/BANDAGES/DRESSINGS) ×1
DERMABOND ADVANCED .7 DNX12 (GAUZE/BANDAGES/DRESSINGS) ×1 IMPLANT
DRSG COVADERM 4X10 (GAUZE/BANDAGES/DRESSINGS) ×2 IMPLANT
ELECT REM PT RETURN 9FT ADLT (ELECTROSURGICAL) ×2
ELECTRODE REM PT RTRN 9FT ADLT (ELECTROSURGICAL) ×1 IMPLANT
GLOVE SURG SS PI 8.0 STRL IVOR (GLOVE) ×2 IMPLANT
GOWN STRL REUS W/ TWL LRG LVL3 (GOWN DISPOSABLE) ×1 IMPLANT
GOWN STRL REUS W/ TWL XL LVL3 (GOWN DISPOSABLE) ×2 IMPLANT
GOWN STRL REUS W/TWL LRG LVL3 (GOWN DISPOSABLE) ×1
GOWN STRL REUS W/TWL XL LVL3 (GOWN DISPOSABLE) ×2
INSERT FOGARTY SM (MISCELLANEOUS) IMPLANT
KIT BASIN OR (CUSTOM PROCEDURE TRAY) ×2 IMPLANT
KIT TURNOVER KIT B (KITS) ×2 IMPLANT
NS IRRIG 1000ML POUR BTL (IV SOLUTION) ×2 IMPLANT
PACK CV ACCESS (CUSTOM PROCEDURE TRAY) ×2 IMPLANT
PAD ARMBOARD 7.5X6 YLW CONV (MISCELLANEOUS) ×4 IMPLANT
STRIP CLOSURE SKIN 1/2X4 (GAUZE/BANDAGES/DRESSINGS) ×2 IMPLANT
SUT MNCRL AB 4-0 PS2 18 (SUTURE) ×6 IMPLANT
SUT PROLENE 6 0 BV (SUTURE) ×2 IMPLANT
SUT SILK 3 0 (SUTURE) ×1
SUT SILK 3-0 18XBRD TIE 12 (SUTURE) ×1 IMPLANT
SUT VIC AB 3-0 SH 27 (SUTURE) ×3
SUT VIC AB 3-0 SH 27X BRD (SUTURE) ×3 IMPLANT
TOWEL GREEN STERILE (TOWEL DISPOSABLE) ×2 IMPLANT
UNDERPAD 30X36 HEAVY ABSORB (UNDERPADS AND DIAPERS) ×2 IMPLANT
WATER STERILE IRR 1000ML POUR (IV SOLUTION) ×2 IMPLANT

## 2021-01-10 NOTE — Anesthesia Procedure Notes (Signed)
Anesthesia Regional Block: Supraclavicular block   Pre-Anesthetic Checklist: ,, timeout performed, Correct Patient, Correct Site, Correct Laterality, Correct Procedure, Correct Position, site marked, Risks and benefits discussed,  Surgical consent,  Pre-op evaluation,  At surgeon's request and post-op pain management  Laterality: Left  Prep: chloraprep       Needles:  Injection technique: Single-shot  Needle Type: Echogenic Needle     Needle Length: 5cm  Needle Gauge: 21     Additional Needles:   Narrative:  Start time: 01/10/2021 7:11 AM End time: 01/10/2021 7:15 AM Injection made incrementally with aspirations every 5 mL.  Performed by: Personally  Anesthesiologist: Audry Pili, MD  Additional Notes: No pain on injection. No increased resistance to injection. Injection made in 5cc increments. Good needle visualization. Patient tolerated the procedure well.

## 2021-01-10 NOTE — Transfer of Care (Signed)
Immediate Anesthesia Transfer of Care Note  Patient: Gabriel Ashley  Procedure(s) Performed: LIGATION OF COMPETING BRANCHES OF LEFT ARM ARTERIOVENOUS FISTULA (Left ) LEFT FISTULA SUPERFICIALIZATION (Left )  Patient Location: PACU  Anesthesia Type:MAC combined with regional for post-op pain  Level of Consciousness: awake, alert , oriented and patient cooperative  Airway & Oxygen Therapy: Patient Spontanous Breathing  Post-op Assessment: Report given to RN, Post -op Vital signs reviewed and stable and Patient moving all extremities  Post vital signs: Reviewed and stable  Last Vitals:  Vitals Value Taken Time  BP 113/81 01/10/21 0858  Temp    Pulse 58 01/10/21 0858  Resp 14 01/10/21 0858  SpO2 96 % 01/10/21 0858  Vitals shown include unvalidated device data.  Last Pain:  Vitals:   01/10/21 0616  TempSrc:   PainSc: 0-No pain      Patients Stated Pain Goal: 5 (76/19/50 9326)  Complications: No complications documented.

## 2021-01-10 NOTE — Anesthesia Procedure Notes (Signed)
Procedure Name: MAC Date/Time: 01/10/2021 8:25 AM Performed by: Rande Brunt, CRNA Pre-anesthesia Checklist: Patient identified, Emergency Drugs available, Suction available and Patient being monitored Patient Re-evaluated:Patient Re-evaluated prior to induction Oxygen Delivery Method: Nasal cannula Preoxygenation: Pre-oxygenation with 100% oxygen Induction Type: IV induction Placement Confirmation: positive ETCO2 and CO2 detector Dental Injury: Teeth and Oropharynx as per pre-operative assessment

## 2021-01-10 NOTE — Anesthesia Postprocedure Evaluation (Signed)
Anesthesia Post Note  Patient: Gabriel Ashley  Procedure(s) Performed: LIGATION OF COMPETING BRANCHES OF LEFT ARM ARTERIOVENOUS FISTULA (Left ) LEFT FISTULA SUPERFICIALIZATION (Left )     Patient location during evaluation: PACU Anesthesia Type: Regional Level of consciousness: awake and alert Pain management: pain level controlled Vital Signs Assessment: post-procedure vital signs reviewed and stable Respiratory status: spontaneous breathing, nonlabored ventilation and respiratory function stable Cardiovascular status: stable and blood pressure returned to baseline Anesthetic complications: no   No complications documented.  Last Vitals:  Vitals:   01/10/21 0915 01/10/21 0930  BP: 112/65 104/65  Pulse: 62 (!) 59  Resp: 20 18  Temp: (!) (P) 36.1 C   SpO2: 93% 94%    Last Pain:  Vitals:   01/10/21 0915  TempSrc:   PainSc: 0-No pain                 Audry Pili

## 2021-01-10 NOTE — Op Note (Signed)
DATE OF SERVICE: 01/10/2021  PATIENT:  Gabriel Ashley  55 y.o. male  PRE-OPERATIVE DIAGNOSIS:  ESRD  POST-OPERATIVE DIAGNOSIS:  Same  PROCEDURE:   1) left upper extremity arteriovenous fistula superficialization 2) left upper extremity arteriovenous fistula side branch ligation  SURGEON:  Surgeon(s) and Role:    * Cherre Robins, MD - Primary  ASSISTANT: [name], RN  An assistant was required to facilitate exposure and expedite the case.  ANESTHESIA:   local, regional and IV sedation  EBL: min  BLOOD ADMINISTERED:none  DRAINS: none   LOCAL MEDICATIONS USED:  LIDOCAINE   SPECIMEN:  none  COUNTS: confirmed correct.  TOURNIQUET:  None  PATIENT DISPOSITION:  PACU - stable   Delay start of Pharmacological VTE agent (>24hrs) due to surgical blood loss or risk of bleeding: no  INDICATION FOR PROCEDURE: [name] is a 56 y.o. male with ESRD dialyzing via RIJ TDC. He had a left brahciobasilic AVF created 0000000. This has matured but is too deep to cannulate and has multiple competing side branches identified on fistulagram 12/29/20. After careful discussion of risks, benefits, and alternatives the patient was offered superficialization and side branch ligation. The patient understood and wished to proceed.  OPERATIVE FINDINGS: Healthy left upper extremity brachiobasilic AV fistula with multiple competing side branches.  These were all ligated.  The fistula was completely mobilized and the upper arm.  The wound was closed in a single layer of 4-0 Monocryl and covered in a generous layer of Dermabond.  DESCRIPTION OF PROCEDURE: After identification of the patient in the pre-operative holding area, the patient was transferred to the operating room. The patient was positioned supine on the operating room table. Anesthesia was induced. The left arm was prepped and draped in standard fashion. A surgical pause was performed confirming correct patient, procedure, and operative  location.  Using intraoperative ultrasound, the course of the left upper extremity was marked on the skin.  Skip incisions were used to expose the fistula.  The subcutaneous tissue was divided with Bovie electrocautery.  Anterior surface of the fistula was exposed throughout the course of the upper arm.  The fistula was completely mobilized from the surrounding subcutaneous tissue using sharp dissection.  Once I branches were encountered they were divided using 3-0 silk suture.  After the fistula was completely skeletonized from the subcutaneous tissue in the upper arm I closed the skip incisions using 4-0 Monocryl in the subcuticular layer.  A generous coat of Dermabond was applied to the skin.  Upon completion of the case instrument and sharps counts were confirmed correct. The patient was transferred to the PACU in good condition. I was present for all portions of the procedure.  Yevonne Aline. Stanford Breed, MD [facility] Phone Number: [phone] 01/10/2021 8:48 AM

## 2021-01-10 NOTE — Interval H&P Note (Signed)
History and Physical Interval Note:  01/10/2021 7:16 AM  Gabriel Ashley  has presented today for surgery, with the diagnosis of ESRD.  The various methods of treatment have been discussed with the patient and family. After consideration of risks, benefits and other options for treatment, the patient has consented to  Procedure(s): LIGATION OF COMPETING BRANCHES OF LEFT ARM ARTERIOVENOUS FISTULA POSSIBLE ELEVATION (Left) as a surgical intervention.  The patient's history has been reviewed, patient examined, no change in status, stable for surgery.  I have reviewed the patient's chart and labs.  Questions were answered to the patient's satisfaction.     Cherre Robins

## 2021-01-10 NOTE — Discharge Instructions (Signed)
[facility]  Discharge Instructions  AV Fistula or Graft Surgery for Dialysis Access  Please refer to the following instructions for your post-procedure care. Your surgeon or physician assistant will discuss any changes with you.  Activity  You may drive the day following your surgery, if you are comfortable and no longer taking prescription pain medication. Resume full activity as the soreness in your incision resolves.  Bathing/Showering  You may shower after you go home. Keep your incision dry for 48 hours. Do not soak in a bathtub, hot tub, or swim until the incision heals completely. You may not shower if you have a hemodialysis catheter.  Incision Care  Clean your incision with mild soap and water after 48 hours. Pat the area dry with a clean towel. You do not need a bandage unless otherwise instructed. Do not apply any ointments or creams to your incision. You may have skin glue on your incision. Do not peel it off. It will come off on its own in about one week. Your arm may swell a bit after surgery. To reduce swelling use pillows to elevate your arm so it is above your heart. Your doctor will tell you if you need to lightly wrap your arm with an ACE bandage.  Diet  Resume your normal diet. There are not special food restrictions following this procedure. In order to heal from your surgery, it is CRITICAL to get adequate nutrition. Your body requires vitamins, minerals, and protein. Vegetables are the best source of vitamins and minerals. Vegetables also provide the perfect balance of protein. Processed food has little nutritional value, so try to avoid this.  Medications  Resume taking all of your medications. If your incision is causing pain, you may take over-the counter pain relievers such as acetaminophen (Tylenol). If you were prescribed a stronger pain medication, please be aware these medications can cause nausea and constipation. Prevent  nausea by taking the medication with a snack or meal. Avoid constipation by drinking plenty of fluids and eating foods with high amount of fiber, such as fruits, vegetables, and grains.  Do not take Tylenol if you are taking prescription pain medications.  Follow up Your surgeon may want to see you in the office following your access surgery. If so, this will be arranged at the time of your surgery.  Please call us immediately for any of the following conditions:  . Increased pain, redness, drainage (pus) from your incision site . Fever of 101 degrees or higher . Severe or worsening pain at your incision site . Hand pain or numbness. .  Reduce your risk of vascular disease:  . Stop smoking. If you would like help, call [facility] at 1-800-QUIT-NOW 289-421-9950) or [place] at ([phone])418-0133  . Manage your cholesterol . Maintain a desired weight . Control your diabetes . Keep your blood pressure down  Dialysis  It will take several weeks to several months for your new dialysis access to be ready for use. Your surgeon will determine when it is okay to use it. Your nephrologist will continue to direct your dialysis. You can continue to use your Permcath until your new access is ready for use.   01/10/2021 Gabriel Ashley [id number] [id number]  Surgeon(s): Cherre Robins, MD  Procedure(s): LIGATION OF COMPETING BRANCHES OF LEFT ARM ARTERIOVENOUS FISTULA LEFT FISTULA SUPERFICIALIZATION  x Do not stick fistula for 6 weeks    If you have any questions, please call the office at [phone].

## 2021-01-11 ENCOUNTER — Encounter (HOSPITAL_COMMUNITY): Payer: Self-pay | Admitting: Vascular Surgery

## 2021-01-11 ENCOUNTER — Telehealth: Payer: Self-pay

## 2021-01-11 NOTE — Telephone Encounter (Signed)
HD called to report some small spots of blood after ligation and superficialization yesterday and that patient's arm was in a sling. They were under the impression patient had a fistulogram - advised them of the actual surgery. Patient is not having much bleeding or difficulty - they are going to put a small dry dressing and wrap a kerlix loosely around the arm. Explained that the sling could be removed - it was placed for the anesthetic block. If patient has further issues, will call back.

## 2021-01-18 ENCOUNTER — Other Ambulatory Visit: Payer: Self-pay

## 2021-01-18 DIAGNOSIS — N186 End stage renal disease: Secondary | ICD-10-CM

## 2021-02-01 ENCOUNTER — Ambulatory Visit (HOSPITAL_COMMUNITY): Payer: No Typology Code available for payment source | Attending: Vascular Surgery

## 2021-02-24 ENCOUNTER — Other Ambulatory Visit: Payer: Self-pay

## 2021-02-24 DIAGNOSIS — N186 End stage renal disease: Secondary | ICD-10-CM

## 2021-02-24 NOTE — Progress Notes (Signed)
Error

## 2021-03-11 ENCOUNTER — Ambulatory Visit (INDEPENDENT_AMBULATORY_CARE_PROVIDER_SITE_OTHER): Payer: No Typology Code available for payment source | Admitting: Physician Assistant

## 2021-03-11 ENCOUNTER — Ambulatory Visit (HOSPITAL_COMMUNITY)
Admission: RE | Admit: 2021-03-11 | Discharge: 2021-03-11 | Disposition: A | Discharge status: Home / Self Care | Payer: No Typology Code available for payment source | Source: Ambulatory Visit | Attending: Vascular Surgery | Admitting: Vascular Surgery

## 2021-03-11 ENCOUNTER — Other Ambulatory Visit: Payer: Self-pay

## 2021-03-11 VITALS — BP 106/69 | HR 93 | Temp 98.0°F | Resp 20 | Ht 72.0 in | Wt 240.0 lb

## 2021-03-11 DIAGNOSIS — N186 End stage renal disease: Secondary | ICD-10-CM | POA: Insufficient documentation

## 2021-03-11 NOTE — Progress Notes (Signed)
    Postoperative Access Visit   History of Present Illness   Gabriel Ashley is a 55 y.o. year old male who presents for postoperative follow-up for:  Left upper extremity AV fistula superficialization and side branch ligation by Dr. Stanford Breed on 01/10/21. The patient's wounds are well healed.  The patient notes no steal symptoms.  His fistula was initially created on 08/18/20 by Dr. Donzetta Matters.   He currently dialyzes via right IJ TDC at [facility] on Tues/ Thurs/ Sat  Physical Examination   Vitals:   03/11/21 1031  BP: 106/69  Pulse: 93  Resp: 20  Temp: 98 F (36.7 C)  TempSrc: Temporal  SpO2: 98%  Weight: 240 lb (108.9 kg)  Height: 6' (1.829 m)   Body mass index is 32.55 kg/m.  left arm Incision is well healed, 2+ radial pulse, hand grip is 5/5, sensation in digits is intact, palpable thrill, bruit can be auscultated     Non invasive vascular lab: 03/11/21  Findings:  +--------------------+----------+-----------------+--------+  AVF                 PSV (cm/s)Flow Vol (mL/min)Comments  +--------------------+----------+-----------------+--------+  Native artery inflow   188          1429                 +--------------------+----------+-----------------+--------+  AVF Anastomosis        465                               +--------------------+----------+-----------------+--------+     +------------+----------+-------------+----------+--------------+  OUTFLOW VEINPSV (cm/s)Diameter (cm)Depth (cm)   Describe     +------------+----------+-------------+----------+--------------+  Prox UA        159        0.84        1.42   Retained valve  +------------+----------+-------------+----------+--------------+  Mid UA         128        0.70        0.50                   +------------+----------+-------------+----------+--------------+  Dist UA        109        1.00        0.32                    +------------+----------+-------------+----------+--------------+  AC Fossa       409        0.82        0.56                   +------------+----------+-------------+----------+--------------+    Medical Decision Making   [name] is a 55 y.o. year old male who presents s/p Left upper extremity AV fistula superficialization and side branch ligation by Dr. Stanford Breed on 01/10/21.   Patent is without signs or symptoms of steal syndrome The patient's access will be ready for use immediately The patient's tunneled dialysis catheter can be removed when Nephrology is comfortable with the performance of the left AV fistula The patient may follow up on a prn basis   Karoline Caldwell, PA-C [facility]: [phone]  Clinic MD: Donzetta Matters

## 2022-12-10 ENCOUNTER — Emergency Department (HOSPITAL_COMMUNITY): Payer: No Typology Code available for payment source

## 2022-12-10 ENCOUNTER — Other Ambulatory Visit: Payer: Self-pay

## 2022-12-10 ENCOUNTER — Emergency Department (HOSPITAL_COMMUNITY)
Admission: EM | Admit: 2022-12-10 | Discharge: 2022-12-11 | Disposition: A | Discharge status: Home / Self Care | Payer: No Typology Code available for payment source | Attending: Emergency Medicine | Admitting: Emergency Medicine

## 2022-12-10 ENCOUNTER — Encounter (HOSPITAL_COMMUNITY): Payer: Self-pay | Admitting: *Deleted

## 2022-12-10 DIAGNOSIS — Z79899 Other long term (current) drug therapy: Secondary | ICD-10-CM | POA: Insufficient documentation

## 2022-12-10 DIAGNOSIS — Z992 Dependence on renal dialysis: Secondary | ICD-10-CM | POA: Insufficient documentation

## 2022-12-10 DIAGNOSIS — R109 Unspecified abdominal pain: Secondary | ICD-10-CM | POA: Diagnosis present

## 2022-12-10 DIAGNOSIS — I5020 Unspecified systolic (congestive) heart failure: Secondary | ICD-10-CM | POA: Diagnosis not present

## 2022-12-10 DIAGNOSIS — N186 End stage renal disease: Secondary | ICD-10-CM | POA: Diagnosis not present

## 2022-12-10 DIAGNOSIS — E8770 Fluid overload, unspecified: Secondary | ICD-10-CM | POA: Insufficient documentation

## 2022-12-10 DIAGNOSIS — I132 Hypertensive heart and chronic kidney disease with heart failure and with stage 5 chronic kidney disease, or end stage renal disease: Secondary | ICD-10-CM | POA: Diagnosis not present

## 2022-12-10 LAB — CBC
HCT: 33.2 % — ABNORMAL LOW (ref 39.0–52.0)
Hemoglobin: 10.3 g/dL — ABNORMAL LOW (ref 13.0–17.0)
MCH: 28.6 pg (ref 26.0–34.0)
MCHC: 31 g/dL (ref 30.0–36.0)
MCV: 92.2 fL (ref 80.0–100.0)
Platelets: 257 10*3/uL (ref 150–400)
RBC: 3.6 MIL/uL — ABNORMAL LOW (ref 4.22–5.81)
RDW: 21.2 % — ABNORMAL HIGH (ref 11.5–15.5)
WBC: 8.3 10*3/uL (ref 4.0–10.5)
nRBC: 0 % (ref 0.0–0.2)

## 2022-12-10 LAB — HEPATITIS B SURFACE ANTIGEN: Hepatitis B Surface Ag: NONREACTIVE

## 2022-12-10 LAB — BASIC METABOLIC PANEL
Anion gap: 14 (ref 5–15)
BUN: 50 mg/dL — ABNORMAL HIGH (ref 6–20)
CO2: 24 mmol/L (ref 22–32)
Calcium: 8.3 mg/dL — ABNORMAL LOW (ref 8.9–10.3)
Chloride: 98 mmol/L (ref 98–111)
Creatinine, Ser: 9.49 mg/dL — ABNORMAL HIGH (ref 0.61–1.24)
GFR, Estimated: 6 mL/min — ABNORMAL LOW (ref >60)
Glucose, Bld: 96 mg/dL (ref 70–99)
Potassium: 4.5 mmol/L (ref 3.5–5.1)
Sodium: 136 mmol/L (ref 135–145)

## 2022-12-10 LAB — TROPONIN I (HIGH SENSITIVITY)
Troponin I (High Sensitivity): 61 ng/L — ABNORMAL HIGH (ref <18)
Troponin I (High Sensitivity): 60 ng/L — ABNORMAL HIGH (ref <18)

## 2022-12-10 MED ORDER — DIPHENHYDRAMINE HCL 50 MG/ML IJ SOLN
25.0000 mg | Freq: Once | INTRAMUSCULAR | Status: AC
  Administered 2022-12-10: 25 mg via INTRAVENOUS

## 2022-12-10 MED ORDER — CHLORHEXIDINE GLUCONATE CLOTH 2 % EX PADS: 6.0000 | MEDICATED_PAD | Freq: Every day | CUTANEOUS | Status: DC

## 2022-12-10 MED ORDER — DIPHENHYDRAMINE HCL 50 MG/ML IJ SOLN
INTRAMUSCULAR | Status: AC
  Filled 2022-12-10: qty 1

## 2022-12-10 MED ORDER — HEPARIN SODIUM (PORCINE) 1000 UNIT/ML DIALYSIS
2500.0000 [IU] | INTRAMUSCULAR | Status: DC | PRN
  Administered 2022-12-10: 2500 [IU] via INTRAVENOUS_CENTRAL
  Filled 2022-12-10 (×2): qty 3

## 2022-12-10 NOTE — Progress Notes (Addendum)
Asked to see this patient for dialysis. Pt presented here c/o SOB, abd distension and leg swelling. He gets HD in [place] at the [place] on TTS. Last HD was Sat. Has been on HD about 2 yrs. No hx of cirrhosis, wondering if they can "drain my abdomen".  I don't think that's a good plan without having known liver disease/ cirrhosis. But dialysis should help him greatly. Even greater though would be if he could take responsibility for his fluid intake and fluid status very seriously w/ scales and notebooks and asking lots of questions at his HD unit. We discussed this for a good while. He should feel better after HD here, goal UF 3.5- 4 L. He should limit his fluid intake so that he only gains 1kg a day in water wt gains. Get a scale, calibrate it w/ the scale at the HD center, weigh yourself 2-3 times per day and learn what puts fluid weight on and what doesn't.  Pt will return to ED after HD completed for reassessment.    Kelly Splinter, MD 12/10/2022, 8:33 PM  Recent Labs  Lab 12/10/22 1630  HGB 10.3*  CALCIUM 8.3*  CREATININE 9.49*  K 4.5    Inpatient medications:  [START ON 12/11/2022] Chlorhexidine Gluconate Cloth  6 each Topical Q0600   sodium chloride flush  3 mL Intravenous Q12H    sodium chloride      ceFAZolin (ANCEF) IV     sodium chloride, sodium chloride flush

## 2022-12-10 NOTE — Procedures (Signed)
   I was present at this dialysis session, have reviewed the session itself and made  appropriate changes Kelly Splinter MD [place] pager [phone]   12/10/2022, 10:43 PM

## 2022-12-10 NOTE — ED Notes (Signed)
Report given to dialysis.   

## 2022-12-10 NOTE — ED Notes (Signed)
Pt off the floor to dialysis.

## 2022-12-10 NOTE — ED Provider Notes (Signed)
[facility] Provider Note   CSN: [id number] Arrival date & time: 12/10/22  1607     History {Add pertinent medical, surgical, social history, OB history to HPI:1} Chief Complaint  Patient presents with   Abdominal Pain    Gabriel Ashley is a 57 y.o. male pmh HTN, HFrEF, CKD, MDD, anxiety pw abd distension    Patient had recent CT abdomen pelvis without contrast done at [facility] clinic on 12/07/2022 that showed small pericardial effusion, ascites, anasarca Region is a chronic finding such as a 5.7 cm cystic lesion.   Abdominal Pain      Home Medications Prior to Admission medications   Medication Sig Start Date End Date Taking? Authorizing Provider  albuterol (PROVENTIL HFA;VENTOLIN HFA) 108 (90 Base) MCG/ACT inhaler Inhale 2 puffs into the lungs every 6 (six) hours as needed for wheezing or shortness of breath. 08/05/18   Pennelope Bracken, MD  amLODipine (NORVASC) 10 MG tablet Take 1 tablet (10 mg total) by mouth daily. 08/05/18   Pennelope Bracken, MD  carvedilol (COREG) 12.5 MG tablet Take 1 tablet (12.5 mg total) by mouth daily. 08/05/18   Pennelope Bracken, MD  cloNIDine (CATAPRES) 0.2 MG tablet Take 1 tablet (0.2 mg total) by mouth 2 (two) times daily. Patient taking differently: Take 0.2 mg by mouth 3 (three) times daily. 08/05/18   Pennelope Bracken, MD  diphenhydrAMINE (BENADRYL) 25 mg capsule Take by mouth. 02/15/21   [provider]  Docusate Sodium (DSS) 100 MG CAPS Take 1 capsule by mouth daily. 03/08/21   [provider]  furosemide (LASIX) 40 MG tablet Take 1 tablet (40 mg total) by mouth daily. 08/05/18   Pennelope Bracken, MD  hydrALAZINE (APRESOLINE) 100 MG tablet Take 100 mg by mouth every 8 (eight) hours.    [provider]  hydrocortisone cream 1 % Apply topically. 02/01/21   [provider]  lanthanum (FOSRENOL) 1000 MG chewable tablet Chew  by mouth. 03/30/20   [provider]  lidocaine-prilocaine (EMLA) cream Apply topically. 12/28/20   [provider]  Multiple Vitamin (MULTIVITAMIN) tablet Take 1 tablet by mouth daily. 04/30/17   Niel Hummer, NP  QUEtiapine (SEROQUEL) 300 MG tablet Take 300 mg by mouth at bedtime.    [provider]  sertraline (ZOLOFT) 100 MG tablet Take 1 tablet (100 mg total) by mouth daily. Patient taking differently: Take 150 mg by mouth daily. 08/06/18   Pennelope Bracken, MD  sildenafil (VIAGRA) 100 MG tablet Take 100 mg by mouth as needed for erectile dysfunction.  03/13/18   [provider]  Skin Protectants, Misc. (EUCERIN) cream Apply topically. 01/17/21   [provider]  traZODone (DESYREL) 150 MG tablet Take 150 mg by mouth at bedtime.    [provider]      Allergies    Patient has no known allergies.    Review of Systems   Review of Systems  Gastrointestinal:  Positive for abdominal pain.    Physical Exam Updated Vital Signs Ht 6' (1.829 m)   Wt 108.9 kg   BMI 32.56 kg/m  Physical Exam  ED Results / Procedures / Treatments   Labs (all labs ordered are listed, but only abnormal results are displayed) Labs Reviewed  BASIC METABOLIC PANEL  CBC  TROPONIN I (HIGH SENSITIVITY)    EKG None  Radiology No results found.  Procedures Procedures  {Document cardiac monitor, telemetry assessment procedure when appropriate:1}  Medications Ordered in ED Medications - No data to display  ED Course/ Medical Decision Making/ A&P   {   Click here for ABCD2, HEART and other calculatorsREFRESH Note before signing :1}                          Medical Decision Making Amount and/or Complexity of Data Reviewed Labs: ordered. Radiology: ordered.   ***  {Document critical care time when appropriate:1} {Document review of labs and clinical decision tools ie heart score, Chads2Vasc2 etc:1}  {Document your independent review  of radiology images, and any outside records:1} {Document your discussion with family members, caretakers, and with consultants:1} {Document social determinants of health affecting pt's care:1} {Document your decision making why or why not admission, treatments were needed:1} Final Clinical Impression(s) / ED Diagnoses Final diagnoses:  None    Rx / DC Orders ED Discharge Orders     None

## 2022-12-10 NOTE — ED Triage Notes (Signed)
Abd distended which is causing some of his sob

## 2022-12-10 NOTE — Discharge Instructions (Addendum)
Your labs today were normal acutely concerning for emergent dialysis at this time, discussed with nephrology and they recommended paracentesis, which is a procedure we talked about of going into the belly directly and draining of the fluid.  This can be done outpatient at the interventional radiology clinic, their number is listed above.  Please call them tomorrow and they will fit you in to have this procedure done.  # Information regarding the procedure with your paperwork today.  However, they will go over it in more detail with you tomorrow.

## 2022-12-10 NOTE — ED Triage Notes (Signed)
Abd pain sob   for 3-4 months  he was dialyzed Friday  fistula lt arm

## 2022-12-11 MED ORDER — ACETAMINOPHEN 325 MG PO TABS
ORAL_TABLET | ORAL | Status: AC
  Filled 2022-12-11: qty 2

## 2022-12-11 MED ORDER — ACETAMINOPHEN 325 MG PO TABS
650.0000 mg | ORAL_TABLET | Freq: Once | ORAL | Status: AC
  Administered 2022-12-11: 650 mg via ORAL

## 2022-12-11 NOTE — Progress Notes (Signed)
Received patient in bed to unit.  Alert and oriented.  Informed consent signed and in chart.   Emmett duration:3.5  Patient tolerated well.  Transported back to the room  Alert, without acute distress.  Hand-off given to patient's nurse.   Access used: fistula Access issues: none  Total UF removed: 3500 Medication(s) given: tylenal 650 mg and benadriyl 25 mg Post HD VS: 138/87 Post HD weight: unable to obtain.  On stretcher     12/11/22 0144  Vitals  Temp 98.4 F (36.9 C)  Temp Source Oral  BP Location Right Arm  BP Method Automatic  Pulse Rate Source Monitor  Oxygen Therapy  O2 Device Room Air  Patient Activity (if Appropriate) In bed  Pulse Oximetry Type Continuous  Post Treatment  Dialyzer Clearance Lightly streaked  Duration of HD Treatment -hour(s) 3.5 hour(s)  Hemodialysis Intake (mL) 0 mL  Liters Processed 83.8  Fluid Removed (mL) 3500 mL  Tolerated HD Treatment Yes  Post-Hemodialysis Comments HD tx completed as scheduled without complications, tolerated well.  AVG/AVF Arterial Site Held (minutes) 10 minutes  AVG/AVF Venous Site Held (minutes) 10 minutes  Note  Observations pt is alert, oriented, verbally responsive, no complaints and stable.  Fistula / Graft Left Upper arm  No placement date or time found.   Orientation: Left  Access Location: Upper arm  Site Condition No complications  Fistula / Graft Assessment Present;Thrill;Bruit;Aneurysm present  Status Deaccessed  Drainage Description None

## 2022-12-12 ENCOUNTER — Ambulatory Visit (HOSPITAL_COMMUNITY)
Admission: RE | Admit: 2022-12-12 | Discharge: 2022-12-12 | Disposition: A | Discharge status: Home / Self Care | Payer: No Typology Code available for payment source | Source: Ambulatory Visit | Attending: Emergency Medicine | Admitting: Emergency Medicine

## 2022-12-12 DIAGNOSIS — R188 Other ascites: Secondary | ICD-10-CM | POA: Insufficient documentation

## 2022-12-12 DIAGNOSIS — R0602 Shortness of breath: Secondary | ICD-10-CM | POA: Insufficient documentation

## 2022-12-12 DIAGNOSIS — N186 End stage renal disease: Secondary | ICD-10-CM | POA: Insufficient documentation

## 2022-12-12 HISTORY — PX: IR PARACENTESIS: IMG2679

## 2022-12-12 LAB — BODY FLUID CELL COUNT WITH DIFFERENTIAL
Eos, Fluid: 0 %
Lymphs, Fluid: 59 %
Monocyte-Macrophage-Serous Fluid: 37 % — ABNORMAL LOW (ref 50–90)
Neutrophil Count, Fluid: 4 % (ref 0–25)
Total Nucleated Cell Count, Fluid: 318 cu mm (ref 0–1000)

## 2022-12-12 LAB — GRAM STAIN: Gram Stain: NONE SEEN

## 2022-12-12 LAB — LACTATE DEHYDROGENASE, PLEURAL OR PERITONEAL FLUID: LD, Fluid: 75 U/L — ABNORMAL HIGH (ref 3–23)

## 2022-12-12 LAB — PROTEIN, PLEURAL OR PERITONEAL FLUID: Total protein, fluid: 3.7 g/dL

## 2022-12-12 LAB — GLUCOSE, PLEURAL OR PERITONEAL FLUID: Glucose, Fluid: 110 mg/dL

## 2022-12-12 LAB — HEPATITIS B SURFACE ANTIBODY, QUANTITATIVE: Hep B S AB Quant (Post): 149.6 m[IU]/mL (ref >9.9)

## 2022-12-12 MED ORDER — LIDOCAINE HCL 1 % IJ SOLN
INTRAMUSCULAR | Status: AC
  Filled 2022-12-12: qty 20

## 2022-12-13 LAB — CYTOLOGY - NON PAP

## 2022-12-13 NOTE — Procedures (Signed)
PROCEDURE SUMMARY:  Successful US guided paracentesis from right lateral abdomen.  Yielded 1.9 liters of clear amber fluid.  No immediate complications.  Patient tolerated well.  EBL = trace  Specimen was sent for labs.  Chardae Mulkern S Zhanae Proffit PA-C 12/13/2022 10:15 AM

## 2022-12-16 ENCOUNTER — Emergency Department (HOSPITAL_COMMUNITY)
Admission: EM | Admit: 2022-12-16 | Discharge: 2022-12-16 | Disposition: A | Discharge status: Home / Self Care | Payer: No Typology Code available for payment source | Attending: Emergency Medicine | Admitting: Emergency Medicine

## 2022-12-16 ENCOUNTER — Emergency Department (HOSPITAL_COMMUNITY): Payer: No Typology Code available for payment source

## 2022-12-16 DIAGNOSIS — Z79899 Other long term (current) drug therapy: Secondary | ICD-10-CM | POA: Insufficient documentation

## 2022-12-16 DIAGNOSIS — N186 End stage renal disease: Secondary | ICD-10-CM | POA: Diagnosis not present

## 2022-12-16 DIAGNOSIS — I132 Hypertensive heart and chronic kidney disease with heart failure and with stage 5 chronic kidney disease, or end stage renal disease: Secondary | ICD-10-CM | POA: Insufficient documentation

## 2022-12-16 DIAGNOSIS — R000 Tachycardia, unspecified: Secondary | ICD-10-CM | POA: Diagnosis not present

## 2022-12-16 DIAGNOSIS — Z992 Dependence on renal dialysis: Secondary | ICD-10-CM | POA: Diagnosis not present

## 2022-12-16 DIAGNOSIS — I509 Heart failure, unspecified: Secondary | ICD-10-CM | POA: Insufficient documentation

## 2022-12-16 LAB — COMPREHENSIVE METABOLIC PANEL
ALT: 21 U/L (ref 0–44)
AST: 33 U/L (ref 15–41)
Albumin: 2.8 g/dL — ABNORMAL LOW (ref 3.5–5.0)
Alkaline Phosphatase: 135 U/L — ABNORMAL HIGH (ref 38–126)
Anion gap: 15 (ref 5–15)
BUN: 37 mg/dL — ABNORMAL HIGH (ref 6–20)
CO2: 24 mmol/L (ref 22–32)
Calcium: 8 mg/dL — ABNORMAL LOW (ref 8.9–10.3)
Chloride: 95 mmol/L — ABNORMAL LOW (ref 98–111)
Creatinine, Ser: 8.37 mg/dL — ABNORMAL HIGH (ref 0.61–1.24)
GFR, Estimated: 7 mL/min — ABNORMAL LOW (ref >60)
Glucose, Bld: 127 mg/dL — ABNORMAL HIGH (ref 70–99)
Potassium: 4 mmol/L (ref 3.5–5.1)
Sodium: 134 mmol/L — ABNORMAL LOW (ref 135–145)
Total Bilirubin: 1 mg/dL (ref 0.3–1.2)
Total Protein: 6.7 g/dL (ref 6.5–8.1)

## 2022-12-16 LAB — CBC WITH DIFFERENTIAL/PLATELET
Abs Immature Granulocytes: 0.03 10*3/uL (ref 0.00–0.07)
Basophils Absolute: 0 10*3/uL (ref 0.0–0.1)
Basophils Relative: 1 %
Eosinophils Absolute: 0.1 10*3/uL (ref 0.0–0.5)
Eosinophils Relative: 2 %
HCT: 30.6 % — ABNORMAL LOW (ref 39.0–52.0)
Hemoglobin: 9.6 g/dL — ABNORMAL LOW (ref 13.0–17.0)
Immature Granulocytes: 0 %
Lymphocytes Relative: 8 %
Lymphs Abs: 0.5 10*3/uL — ABNORMAL LOW (ref 0.7–4.0)
MCH: 28.7 pg (ref 26.0–34.0)
MCHC: 31.4 g/dL (ref 30.0–36.0)
MCV: 91.6 fL (ref 80.0–100.0)
Monocytes Absolute: 0.8 10*3/uL (ref 0.1–1.0)
Monocytes Relative: 11 %
Neutro Abs: 5.3 10*3/uL (ref 1.7–7.7)
Neutrophils Relative %: 78 %
Platelets: 246 10*3/uL (ref 150–400)
RBC: 3.34 MIL/uL — ABNORMAL LOW (ref 4.22–5.81)
RDW: 20.9 % — ABNORMAL HIGH (ref 11.5–15.5)
WBC: 6.7 10*3/uL (ref 4.0–10.5)
nRBC: 0 % (ref 0.0–0.2)

## 2022-12-16 LAB — TROPONIN I (HIGH SENSITIVITY)
Troponin I (High Sensitivity): 60 ng/L — ABNORMAL HIGH (ref <18)
Troponin I (High Sensitivity): 58 ng/L — ABNORMAL HIGH (ref <18)

## 2022-12-16 LAB — TSH: TSH: 1.638 u[IU]/mL (ref 0.350–4.500)

## 2022-12-16 LAB — MAGNESIUM: Magnesium: 2 mg/dL (ref 1.7–2.4)

## 2022-12-16 NOTE — ED Provider Notes (Signed)
Patient signed out to me at 1500 pending CBC.  In short this is a 57 year old male with a past medical history of ESRD, hypertension, OSA and CHF that presented to the emergency department without palpitations during his dialysis session.  He was initially mildly tachycardic on arrival and has remained in normal sinus rhythm here.  Troponins are flat.  Upon my evaluation the patient is yelling because "the nurse walked by my room and ignored me and said nothing". He is requesting his IV be removed and wants to leave. He was explained that his CBC is pending but states he does not want to wait. He was given outpatient cardiology follow up and recommended HD on Monday.   Kemper Durie, DO 12/16/22 1537

## 2022-12-16 NOTE — Discharge Instructions (Addendum)
Your dialysis center felt that you went into an atrial fibrillation heart rhythm with a high heart rate.  We did not see any evidence of this in the emergency department.  You would benefit from a cardiology follow-up where they can monitor your heart rate to identify abnormal rhythms and the frequency of such.  A cardiology referral was sent.  If you do not hear from the cardiology office in the next 2 days, call the number below.  You have a make-up dialysis session scheduled for Monday morning at 11:30 AM.  This will be at your normal dialysis center.  After that, you should resume your normal schedule the following day.  Return to the emergency department at anytime for any new symptoms of concern.

## 2022-12-16 NOTE — ED Provider Notes (Signed)
[facility] Provider Note   CSN: [id number] Arrival date & time: 12/16/22  0935     History  Chief Complaint  Patient presents with   Irregular Heart Beat    Gabriel Ashley is a 57 y.o. male.  HPI Patient presents for concern of irregular heartbeat.  Medical history includes anemia, anxiety, depression, HTN, polysubstance abuse, ESRD (HD T, TH, SA), OSA, CHF.  Patient was recently seen in the ED 6 days ago for abdominal distention.  He underwent hemodialysis from the ED on that day.  He had 3500 cc removed.  He underwent paracentesis 4 days ago with 1.9 L of clear amber fluid removed.  Today, he was approximately 45 minutes into his dialysis session.  Session was discontinued due to staff at dialysis center described as an irregular heartbeat.  Patient does endorse palpitations at the time.  He denies any associated chest pain.  He has had recent shortness of breath that he attributes to heavy use of incense lately.  Currently, he is asymptomatic.    Home Medications Prior to Admission medications   Medication Sig Start Date End Date Taking? Authorizing Provider  albuterol (PROVENTIL HFA;VENTOLIN HFA) 108 (90 Base) MCG/ACT inhaler Inhale 2 puffs into the lungs every 6 (six) hours as needed for wheezing or shortness of breath. 08/05/18   Pennelope Bracken, MD  amLODipine (NORVASC) 10 MG tablet Take 1 tablet (10 mg total) by mouth daily. 08/05/18   Pennelope Bracken, MD  carvedilol (COREG) 12.5 MG tablet Take 1 tablet (12.5 mg total) by mouth daily. 08/05/18   Pennelope Bracken, MD  cloNIDine (CATAPRES) 0.2 MG tablet Take 1 tablet (0.2 mg total) by mouth 2 (two) times daily. Patient taking differently: Take 0.2 mg by mouth 3 (three) times daily. 08/05/18   Pennelope Bracken, MD  diphenhydrAMINE (BENADRYL) 25 mg capsule Take by mouth. 02/15/21   [provider]  Docusate Sodium (DSS) 100 MG CAPS Take 1 capsule by  mouth daily. 03/08/21   [provider]  furosemide (LASIX) 40 MG tablet Take 1 tablet (40 mg total) by mouth daily. 08/05/18   Pennelope Bracken, MD  hydrALAZINE (APRESOLINE) 100 MG tablet Take 100 mg by mouth every 8 (eight) hours.    [provider]  hydrocortisone cream 1 % Apply topically. 02/01/21   [provider]  lanthanum (FOSRENOL) 1000 MG chewable tablet Chew by mouth. 03/30/20   [provider]  lidocaine-prilocaine (EMLA) cream Apply topically. 12/28/20   [provider]  Multiple Vitamin (MULTIVITAMIN) tablet Take 1 tablet by mouth daily. 04/30/17   Niel Hummer, NP  QUEtiapine (SEROQUEL) 300 MG tablet Take 300 mg by mouth at bedtime.    [provider]  sertraline (ZOLOFT) 100 MG tablet Take 1 tablet (100 mg total) by mouth daily. Patient taking differently: Take 150 mg by mouth daily. 08/06/18   Pennelope Bracken, MD  sildenafil (VIAGRA) 100 MG tablet Take 100 mg by mouth as needed for erectile dysfunction.  03/13/18   [provider]  Skin Protectants, Misc. (EUCERIN) cream Apply topically. 01/17/21   [provider]  traZODone (DESYREL) 150 MG tablet Take 150 mg by mouth at bedtime.    [provider]      Allergies    Patient has no known allergies.    Review of Systems   Review of Systems  Respiratory:  Positive for shortness of breath.   Cardiovascular:  Positive for  palpitations.  All other systems reviewed and are negative.   Physical Exam Updated Vital Signs BP 119/78   Pulse 96   Temp 98.3 F (36.8 C)   Resp 19   SpO2 99%  Physical Exam Vitals and nursing note reviewed.  Constitutional:      General: He is not in acute distress.    Appearance: Normal appearance. He is well-developed and normal weight. He is not ill-appearing, toxic-appearing or diaphoretic.  HENT:     Head: Normocephalic and atraumatic.     Right Ear: External ear normal.     Left Ear: External  ear normal.     Nose: Nose normal.     Mouth/Throat:     Mouth: Mucous membranes are moist.  Eyes:     Extraocular Movements: Extraocular movements intact.     Conjunctiva/sclera: Conjunctivae normal.  Cardiovascular:     Rate and Rhythm: Normal rate and regular rhythm.     Heart sounds: No murmur heard. Pulmonary:     Effort: Pulmonary effort is normal. No respiratory distress.     Breath sounds: Normal breath sounds. No wheezing or rales.  Chest:     Chest wall: No tenderness.  Abdominal:     General: There is no distension.     Palpations: Abdomen is soft.     Tenderness: There is no abdominal tenderness.  Musculoskeletal:        General: No swelling. Normal range of motion.     Cervical back: Normal range of motion and neck supple.     Right lower leg: No edema.     Left lower leg: No edema.  Skin:    General: Skin is warm and dry.     Capillary Refill: Capillary refill takes less than 2 seconds.     Coloration: Skin is not jaundiced or pale.  Neurological:     General: No focal deficit present.     Mental Status: He is alert and oriented to person, place, and time.     Cranial Nerves: No cranial nerve deficit.     Sensory: No sensory deficit.     Motor: No weakness.     Coordination: Coordination normal.  Psychiatric:        Mood and Affect: Mood normal.        Behavior: Behavior normal.        Thought Content: Thought content normal.        Judgment: Judgment normal.     ED Results / Procedures / Treatments   Labs (all labs ordered are listed, but only abnormal results are displayed) Labs Reviewed  COMPREHENSIVE METABOLIC PANEL - Abnormal; Notable for the following components:      Result Value   Sodium 134 (*)    Chloride 95 (*)    Glucose, Bld 127 (*)    BUN 37 (*)    Creatinine, Ser 8.37 (*)    Calcium 8.0 (*)    Albumin 2.8 (*)    Alkaline Phosphatase 135 (*)    GFR, Estimated 7 (*)    All other components within normal limits  TROPONIN I (HIGH  SENSITIVITY) - Abnormal; Notable for the following components:   Troponin I (High Sensitivity) 60 (*)    All other components within normal limits  TROPONIN I (HIGH SENSITIVITY) - Abnormal; Notable for the following components:   Troponin I (High Sensitivity) 58 (*)    All other components within normal limits  MAGNESIUM  TSH  CBC WITH DIFFERENTIAL/PLATELET  CBC  WITH DIFFERENTIAL/PLATELET    EKG EKG Interpretation  Date/Time:  Saturday December 16 2022 09:49:03 EDT Ventricular Rate:  106 PR Interval:  121 QRS Duration: 101 QT Interval:  383 QTC Calculation: 509 R Axis:   81 Text Interpretation: Sinus tachycardia Low voltage, precordial leads Prolonged QT interval Confirmed by Godfrey Pick [phone] on 12/16/2022 12:02:40 PM  Radiology DG Chest Portable 1 View  Result Date: 12/16/2022 CLINICAL DATA:  Irregular heartbeat EXAM: PORTABLE CHEST 1 VIEW COMPARISON:  12/10/2022 FINDINGS: Gross cardiomegaly. Mild diffuse interstitial opacity. The visualized skeletal structures are unremarkable. IMPRESSION: Gross cardiomegaly with mild diffuse interstitial opacity, likely edema. No focal airspace opacity. Electronically Signed   By: Delanna Ahmadi M.D.   On: 12/16/2022 10:59    Procedures Procedures    Medications Ordered in ED Medications - No data to display  ED Course/ Medical Decision Making/ A&P                             Medical Decision Making Amount and/or Complexity of Data Reviewed Labs: ordered. Radiology: ordered.   This patient presents to the ED for concern of irregular heart rate, this involves an extensive number of treatment options, and is a complaint that carries with it a high risk of complications and morbidity.  The differential diagnosis includes atrial fibrillation, atrial flutter, SVT, ventricular tachycardia, PVCs, PACs, bradycardia   Co morbidities that complicate the patient evaluation  anemia, anxiety, depression, HTN, polysubstance abuse, ESRD (HD T, TH,  SA), OSA, CHF   Additional history obtained:  Additional history obtained from N/A External records from outside source obtained and reviewed including EMR   Lab Tests:  I Ordered, and personally interpreted labs.  The pertinent results include: Elevated creatinine consistent with ESRD, troponin consistent with prior lab work, normal potassium, normal TSH   Imaging Studies ordered:  I ordered imaging studies including chest x-ray I independently visualized and interpreted imaging which showed cardiomegaly with mild edema I agree with the radiologist interpretation   Cardiac Monitoring: / EKG:  The patient was maintained on a cardiac monitor.  I personally viewed and interpreted the cardiac monitored which showed an underlying rhythm of: Sinus rhythm   Consultations Obtained:  I requested consultation with the nephrologist, Dr. Posey Pronto,  and discussed lab and imaging findings as well as pertinent plan - they recommend: Make-up hemodialysis session on Monday, which Dr. Posey Pronto has arranged with patient's dialysis center. I request consultation with cardiologist, they recommend follow-up with PCP for possible increases in his Coreg dose.  This will be limited by his baseline blood pressure and how much his blood pressure drops during dialysis.  They agree with plan for follow-up with cardiology office for possible Zio patch monitoring.   Problem List / ED Course / Critical interventions / Medication management  Patient is a pleasant 57 year old male presenting from dialysis for concern of irregular heartbeat.  He was approximately 45 minutes into his scheduled dialysis session when it was discontinued.  He is not clear of what abnormal heart rhythm they identified.  He states that at the time, he felt anxious but denies any associated symptoms.  He is asymptomatic on arrival.  On arrival, patient has normal heart rate.  EKG confirms sinus rhythm.  He was kept on bedside cardiac monitor.   Laboratory workup was initiated.  Lab work is reassuring.  Patient remained asymptomatic while in the ED.  On cardiac monitor, he remained in sinus rhythm.  Given his missed dialysis today, I did reach out to nephrology.  I spoke with Dr. Posey Pronto who graciously contacted the patient's dialysis center.  He did arrange for make-up session on Monday morning at 11:30 AM.  He also inquired about the irregular heart rhythm which Pam at dialysis center stated was A-fib with RVR.  Heart rate reportedly got up to 150.  Patient was informed of this.  I spoke with cardiologist on-call, who does not recommend any medication changes at this time.  Patient receives his outpatient care through the [facility].  He does not believe that he currently sees a cardiologist.  Cardiology referral was ordered.  Patient was discharged in stable condition.   Social Determinants of Health:  Has access to outpatient care through the [place] [facility]        Final Clinical Impression(s) / ED Diagnoses Final diagnoses:  Tachycardia    Rx / DC Orders ED Discharge Orders          Ordered    Ambulatory referral to Cardiology       Comments: If you have not heard from the Cardiology office within the next 72 hours please call [phone].   12/16/22 1430              Godfrey Pick, MD 12/16/22 1433

## 2022-12-16 NOTE — ED Triage Notes (Signed)
Patient states he was sent to ED from dialysis for evaluation of an irregular heart beat that started during treatment. Patient states treatment was stopped 30 minutes in. Patient is alert, oriented, and denies any complaints.

## 2023-01-23 ENCOUNTER — Other Ambulatory Visit (HOSPITAL_COMMUNITY): Payer: Self-pay

## 2023-01-23 DIAGNOSIS — R140 Abdominal distension (gaseous): Secondary | ICD-10-CM

## 2023-02-07 ENCOUNTER — Ambulatory Visit (HOSPITAL_COMMUNITY): Admission: RE | Admit: 2023-02-07 | Payer: No Typology Code available for payment source | Source: Ambulatory Visit

## 2023-02-07 ENCOUNTER — Encounter (HOSPITAL_COMMUNITY): Payer: Self-pay

## 2023-02-22 ENCOUNTER — Telehealth: Payer: Self-pay

## 2023-02-22 NOTE — Telephone Encounter (Signed)
Lyla Son, RN with [facility] called stating that the pt is wanting to switch to PD and was requesting advice on the next steps.  Reviewed pt's chart, returned call for clarification, two identifiers used. Informed her that he has been seen here in the past and instructed her to send a referral with office notes so he could be evaluated. He is also undergoing paracentesis on a regular basis and wasn't sure if that would impact his ability to do PD. Told her that at least this office could evaluate and help make that determination. She was very appreciative since it would be a large undertaking to have him do it there. Fax # given. Confirmed understanding.

## 2023-03-14 ENCOUNTER — Ambulatory Visit: Payer: No Typology Code available for payment source | Admitting: Vascular Surgery

## 2023-03-19 NOTE — Progress Notes (Unsigned)
[facility] [place]  ASSESSMENT / PLAN: Gabriel Ashley is a 57 y.o. gentleman with end-stage renal disease, currently dialyzing through a left arm brachiobasilic arteriovenous fistula.  This is working well for him.  Unfortunately he has chronic ascites in his belly which has required paracentesis.  He is not a good candidate for peritoneal dialysis because of this.  I encouraged him to follow-up with his nephrologist to look for a cause for his recurrent ascites.  He can follow-up with me on an as-needed basis.  CHIEF COMPLAINT: Desires transition to peritoneal dialysis  HISTORY OF PRESENT ILLNESS: Gabriel Ashley is a 57 y.o. male with end-stage renal disease, dialyzing through a left arm brachiobasilic arteriovenous fistula.  The patient desires transition to peritoneal dialysis.  He has chronic ascites which has been managed with intermittent paracentesis.  I counseled him about the technical aspects of peritoneal dialysis, and the likely chronic leak he would suffer if a indwelling drain was placed into his abdomen.  Past Medical History:  Diagnosis Date   Anemia    Anxiety    Asthma    Cannabis use in remisison    CHF (congestive heart failure) (HCC)    Cocaine use    Depression    ED (erectile dysfunction)    Hypertension    Insomnia    Kidney disease    T/Th/Sa   OSA (obstructive sleep apnea)    on CPAP   Pneumonia    Post traumatic stress disorder (PTSD)    Vitamin D deficiency     Past Surgical History:  Procedure Laterality Date   A/V FISTULAGRAM N/A 12/29/2020   Procedure: A/V FISTULAGRAM - Left Upper;  Surgeon: Leonie Douglas, MD;  Location: [facility];  Service: Cardiovascular;  Laterality: N/A;   AV FISTULA PLACEMENT Left 08/18/2020   Procedure: LEFT ARM ARTERIOVENOUS (AV) FISTULA;  Surgeon: Maeola Harman, MD;  Location: [facility] OR;  Service: Vascular;  Laterality: Left;   FISTULA SUPERFICIALIZATION Left 01/10/2021   Procedure: LEFT  FISTULA SUPERFICIALIZATION;  Surgeon: Leonie Douglas, MD;  Location: [facility] OR;  Service: Vascular;  Laterality: Left;   IR PARACENTESIS  12/12/2022   left arm surgery     LIGATION OF COMPETING BRANCHES OF ARTERIOVENOUS FISTULA Left 01/10/2021   Procedure: LIGATION OF COMPETING BRANCHES OF LEFT ARM ARTERIOVENOUS FISTULA;  Surgeon: Leonie Douglas, MD;  Location: [facility] OR;  Service: Vascular;  Laterality: Left;    Family History  Problem Relation Age of Onset   Breast cancer Mother    Cancer Father        type unknown    Social History   Socioeconomic History   Marital status: Single    Spouse name: Not on file   Number of children: 1   Years of education: Not on file   Highest education level: Not on file  Occupational History   Not on file  Tobacco Use   Smoking status: Former    Packs/day: 1.00    Years: 31.00    Additional pack years: 0.00    Total pack years: 31.00    Types: Cigarettes    Quit date: 11/03/2017    Years since quitting: 5.3   Smokeless tobacco: Never  Vaping Use   Vaping Use: Never used  Substance and Sexual Activity   Alcohol use: Not Currently    Comment: 2-3 years ago   Drug use: Yes    Types: Cocaine, Marijuana    Comment: reports been  clean > 1 year (note made 08/16/20)   Sexual activity: Yes  Other Topics Concern   Not on file  Social History Narrative   Not on file   Social Determinants of Health   Financial Resource Strain: Not on file  Food Insecurity: Not on file  Transportation Needs: Not on file  Physical Activity: Not on file  Stress: Not on file  Social Connections: Not on file  Intimate Partner Violence: Not on file    No Known Allergies  Current Outpatient Medications  Medication Sig Dispense Refill   albuterol (PROVENTIL HFA;VENTOLIN HFA) 108 (90 Base) MCG/ACT inhaler Inhale 2 puffs into the lungs every 6 (six) hours as needed for wheezing or shortness of breath. 1 Inhaler 0   amLODipine (NORVASC) 10 MG tablet Take 1 tablet  (10 mg total) by mouth daily. 30 tablet 0   carvedilol (COREG) 12.5 MG tablet Take 1 tablet (12.5 mg total) by mouth daily. 30 tablet 0   cloNIDine (CATAPRES) 0.2 MG tablet Take 1 tablet (0.2 mg total) by mouth 2 (two) times daily. (Patient taking differently: Take 0.2 mg by mouth 3 (three) times daily.) 60 tablet 0   diphenhydrAMINE (BENADRYL) 25 mg capsule Take by mouth.     Docusate Sodium (DSS) 100 MG CAPS Take 1 capsule by mouth daily.     furosemide (LASIX) 40 MG tablet Take 1 tablet (40 mg total) by mouth daily. 30 tablet 0   hydrALAZINE (APRESOLINE) 100 MG tablet Take 100 mg by mouth every 8 (eight) hours.     hydrocortisone cream 1 % Apply topically.     lanthanum (FOSRENOL) 1000 MG chewable tablet Chew by mouth.     lidocaine-prilocaine (EMLA) cream Apply topically.     Multiple Vitamin (MULTIVITAMIN) tablet Take 1 tablet by mouth daily.     QUEtiapine (SEROQUEL) 300 MG tablet Take 300 mg by mouth at bedtime.     sertraline (ZOLOFT) 100 MG tablet Take 1 tablet (100 mg total) by mouth daily. (Patient taking differently: Take 150 mg by mouth daily.) 30 tablet 0   sildenafil (VIAGRA) 100 MG tablet Take 100 mg by mouth as needed for erectile dysfunction.   11   Skin Protectants, Misc. (EUCERIN) cream Apply topically.     traZODone (DESYREL) 150 MG tablet Take 150 mg by mouth at bedtime.     Current Facility-Administered Medications  Medication Dose Route Frequency Provider Last Rate Last Admin   0.9 %  sodium chloride infusion  250 mL Intravenous PRN Chuck Hint, MD       ceFAZolin (ANCEF) 1 g in dextrose 5 % 100 mL IVPB  1 g Intravenous Once Maeola Harman, MD       sodium chloride flush (NS) 0.9 % injection 3 mL  3 mL Intravenous Q12H Chuck Hint, MD       sodium chloride flush (NS) 0.9 % injection 3 mL  3 mL Intravenous PRN Chuck Hint, MD        PHYSICAL EXAM Vitals:   03/20/23 1314  BP: 130/73  Pulse: (!) 58  Resp: 20  Temp: 98.3  F (36.8 C)  SpO2: 94%  Weight: 94.3 kg  Height: 6' (1.829 m)    Middle-age man in no acute distress Regular rate and rhythm Unlabored breathing Left arm brachiobasilic fistula with good thrill Abdomen is soft and distended with ascites  PERTINENT LABORATORY AND RADIOLOGIC DATA  Most recent CBC    Latest Ref Rng & Units 12/16/2022  12:30 PM 12/10/2022    4:30 PM 01/10/2021    6:03 AM  CBC  WBC 4.0 - 10.5 K/uL 6.7  8.3    Hemoglobin 13.0 - 17.0 g/dL 9.6  16.1  09.6   Hematocrit 39.0 - 52.0 % 30.6  33.2  33.0   Platelets 150 - 400 K/uL 246  257       Most recent CMP    Latest Ref Rng & Units 12/16/2022   10:25 AM 12/10/2022    4:30 PM 01/10/2021    6:03 AM  CMP  Glucose 70 - 99 mg/dL 045  96  90   BUN 6 - 20 mg/dL 37  50  68   Creatinine 0.61 - 1.24 mg/dL 4.09  8.11  91.47   Sodium 135 - 145 mmol/L 134  136  134   Potassium 3.5 - 5.1 mmol/L 4.0  4.5  5.3   Chloride 98 - 111 mmol/L 95  98  98   CO2 22 - 32 mmol/L 24  24    Calcium 8.9 - 10.3 mg/dL 8.0  8.3    Total Protein 6.5 - 8.1 g/dL 6.7     Total Bilirubin 0.3 - 1.2 mg/dL 1.0     Alkaline Phos 38 - 126 U/L 135     AST 15 - 41 U/L 33     ALT 0 - 44 U/L 21       Renal function CrCl cannot be calculated (Patient's most recent lab result is older than the maximum 21 days allowed.).  Hgb A1c MFr Bld (%)  Date Value  07/28/2018 5.6    LDL Cholesterol  Date Value Ref Range Status  07/28/2018 95 0 - 99 mg/dL Final    Comment:           Total Cholesterol/HDL:CHD Risk Coronary Heart Disease Risk Table                     Men   Women  1/2 Average Risk   3.4   3.3  Average Risk       5.0   4.4  2 X Average Risk   9.6   7.1  3 X Average Risk  23.4   11.0        Use the calculated Patient Ratio above and the CHD Risk Table to determine the patient's CHD Risk.        ATP III CLASSIFICATION (LDL):  <100     mg/dL   Optimal  829-562  mg/dL   Near or Above                    Optimal  130-159  mg/dL    Borderline  130-865  mg/dL   High  >784     mg/dL   Very High Performed at [facility], [address] [place], [place] [address]      Rande Brunt. Lenell Antu, MD FACS [facility] Phone Number: [phone] 03/22/2023 10:04 AM   Total time spent on preparing this encounter including chart review, data review, collecting history, examining the patient, coordinating care for this established patient, 30 minutes.  Portions of this report may have been transcribed using voice recognition software.  Every effort has been made to ensure accuracy; however, inadvertent computerized transcription errors may still be present.

## 2023-03-20 ENCOUNTER — Encounter: Payer: Self-pay | Admitting: Vascular Surgery

## 2023-03-20 ENCOUNTER — Ambulatory Visit (INDEPENDENT_AMBULATORY_CARE_PROVIDER_SITE_OTHER): Payer: No Typology Code available for payment source | Admitting: Vascular Surgery

## 2023-03-20 VITALS — BP 130/73 | HR 58 | Temp 98.3°F | Resp 20 | Ht 72.0 in | Wt 208.0 lb

## 2023-03-20 DIAGNOSIS — Z992 Dependence on renal dialysis: Secondary | ICD-10-CM | POA: Diagnosis not present

## 2023-03-20 DIAGNOSIS — N186 End stage renal disease: Secondary | ICD-10-CM

## 2023-04-20 ENCOUNTER — Other Ambulatory Visit (HOSPITAL_COMMUNITY): Payer: Self-pay

## 2023-04-20 DIAGNOSIS — R140 Abdominal distension (gaseous): Secondary | ICD-10-CM

## 2023-04-27 ENCOUNTER — Encounter (HOSPITAL_COMMUNITY): Payer: Self-pay

## 2023-04-27 ENCOUNTER — Inpatient Hospital Stay (HOSPITAL_COMMUNITY)
Admission: RE | Admit: 2023-04-27 | Discharge: 2023-04-27 | Disposition: A | Discharge status: Home / Self Care | Payer: No Typology Code available for payment source | Source: Ambulatory Visit

## 2023-05-09 ENCOUNTER — Ambulatory Visit (HOSPITAL_COMMUNITY)
Admission: RE | Admit: 2023-05-09 | Discharge: 2023-05-09 | Disposition: A | Discharge status: Home / Self Care | Payer: No Typology Code available for payment source | Source: Ambulatory Visit

## 2023-05-09 DIAGNOSIS — R140 Abdominal distension (gaseous): Secondary | ICD-10-CM

## 2023-05-09 DIAGNOSIS — R188 Other ascites: Secondary | ICD-10-CM | POA: Insufficient documentation

## 2023-05-09 HISTORY — PX: IR PARACENTESIS: IMG2679

## 2023-05-09 MED ORDER — ALBUMIN HUMAN 25 % IV SOLN
INTRAVENOUS | Status: AC
  Filled 2023-05-09: qty 200

## 2023-05-09 MED ORDER — LIDOCAINE HCL 1 % IJ SOLN
INTRAMUSCULAR | Status: AC
  Filled 2023-05-09: qty 20

## 2023-05-09 MED ORDER — ALBUMIN HUMAN 25 % IV SOLN
50.0000 g | Freq: Once | INTRAVENOUS | Status: AC
  Administered 2023-05-09: 50 g via INTRAVENOUS

## 2023-05-09 MED ORDER — LIDOCAINE HCL 1 % IJ SOLN
20.0000 mL | Freq: Once | INTRAMUSCULAR | Status: AC
  Administered 2023-05-09: 10 mL via INTRADERMAL

## 2023-05-09 NOTE — Procedures (Signed)
PROCEDURE SUMMARY:  Successful US guided paracentesis from right lateral abdomen.  Yielded 5.1 L of blood-tinged fluid.  No immediate complications.  Pt tolerated well.   Specimen was not sent for labs.  EBL < 5mL  Hoyt Koch PA-C 05/09/2023 8:55 AM

## 2023-05-15 ENCOUNTER — Other Ambulatory Visit (HOSPITAL_COMMUNITY): Payer: Self-pay

## 2023-05-15 DIAGNOSIS — R140 Abdominal distension (gaseous): Secondary | ICD-10-CM

## 2023-05-16 ENCOUNTER — Ambulatory Visit (HOSPITAL_COMMUNITY)
Admission: RE | Admit: 2023-05-16 | Discharge: 2023-05-16 | Discharge status: Home / Self Care | Payer: No Typology Code available for payment source | Source: Ambulatory Visit

## 2023-05-16 DIAGNOSIS — R188 Other ascites: Secondary | ICD-10-CM | POA: Diagnosis present

## 2023-05-16 DIAGNOSIS — R140 Abdominal distension (gaseous): Secondary | ICD-10-CM

## 2023-05-16 HISTORY — PX: IR PARACENTESIS: IMG2679

## 2023-05-16 MED ORDER — LIDOCAINE HCL (PF) 1 % IJ SOLN
INTRAMUSCULAR | Status: AC
  Filled 2023-05-16: qty 30

## 2023-05-23 ENCOUNTER — Ambulatory Visit (HOSPITAL_COMMUNITY)
Admission: RE | Admit: 2023-05-23 | Discharge: 2023-05-23 | Disposition: A | Discharge status: Home / Self Care | Payer: No Typology Code available for payment source | Source: Ambulatory Visit

## 2023-05-23 DIAGNOSIS — R140 Abdominal distension (gaseous): Secondary | ICD-10-CM | POA: Diagnosis present

## 2023-05-23 DIAGNOSIS — R188 Other ascites: Secondary | ICD-10-CM | POA: Diagnosis not present

## 2023-05-23 HISTORY — PX: IR PARACENTESIS: IMG2679

## 2023-05-23 MED ORDER — LIDOCAINE HCL 1 % IJ SOLN
INTRAMUSCULAR | Status: AC
  Filled 2023-05-23: qty 20

## 2023-05-23 MED ORDER — LIDOCAINE HCL 1 % IJ SOLN: 10.0000 mL | Freq: Once | INTRAMUSCULAR | Status: DC

## 2023-05-23 NOTE — Procedures (Signed)
Ultrasound-guided therapeutic paracentesis performed yielding 4.2 liters of straw colored fluid. No immediate complications. EBL is none.

## 2023-05-30 ENCOUNTER — Ambulatory Visit (HOSPITAL_COMMUNITY)
Admission: RE | Admit: 2023-05-30 | Discharge: 2023-05-30 | Disposition: A | Discharge status: Home / Self Care | Payer: No Typology Code available for payment source | Source: Ambulatory Visit

## 2023-05-30 DIAGNOSIS — R188 Other ascites: Secondary | ICD-10-CM | POA: Diagnosis present

## 2023-05-30 DIAGNOSIS — R140 Abdominal distension (gaseous): Secondary | ICD-10-CM

## 2023-05-30 HISTORY — PX: IR PARACENTESIS: IMG2679

## 2023-05-30 MED ORDER — LIDOCAINE HCL 1 % IJ SOLN
20.0000 mL | Freq: Once | INTRAMUSCULAR | Status: AC
  Administered 2023-05-30: 10 mL

## 2023-05-30 MED ORDER — LIDOCAINE HCL 1 % IJ SOLN
INTRAMUSCULAR | Status: AC
  Filled 2023-05-30: qty 20

## 2023-05-30 NOTE — Procedures (Signed)
PROCEDURE SUMMARY:  Successful US guided paracentesis from right lateral abdomen.  Yielded 3.2 liters of yellow fluid.  No immediate complications.  Pt tolerated well.   Specimen was not sent for labs.  EBL < 5mL  Hoyt Koch PA-C 05/30/2023 9:41 AM

## 2023-06-04 ENCOUNTER — Other Ambulatory Visit (HOSPITAL_COMMUNITY): Payer: Self-pay

## 2023-06-04 DIAGNOSIS — R140 Abdominal distension (gaseous): Secondary | ICD-10-CM

## 2023-06-06 ENCOUNTER — Ambulatory Visit (HOSPITAL_COMMUNITY)
Admission: RE | Admit: 2023-06-06 | Discharge: 2023-06-06 | Disposition: A | Discharge status: Home / Self Care | Payer: No Typology Code available for payment source | Source: Ambulatory Visit

## 2023-06-06 DIAGNOSIS — R140 Abdominal distension (gaseous): Secondary | ICD-10-CM

## 2023-06-06 DIAGNOSIS — R188 Other ascites: Secondary | ICD-10-CM | POA: Insufficient documentation

## 2023-06-06 HISTORY — PX: IR PARACENTESIS: IMG2679

## 2023-06-06 MED ORDER — LIDOCAINE HCL 1 % IJ SOLN
INTRAMUSCULAR | Status: AC
  Filled 2023-06-06: qty 20

## 2023-06-06 MED ORDER — LIDOCAINE HCL 1 % IJ SOLN: 10.0000 mL | Freq: Once | INTRAMUSCULAR | Status: DC

## 2023-06-06 NOTE — Procedures (Signed)
PROCEDURE SUMMARY:  Successful image-guided paracentesis from the left lower abdomen.  Yielded 2.7 liters of hazy yellow fluid.  No immediate complications.  EBL = trace. Patient tolerated well.   Specimen was not sent for labs.  Please see imaging section of Epic for full dictation.   Lynann Bologna Stella Bortle PA-C 06/06/2023 8:46 AM

## 2023-06-13 ENCOUNTER — Ambulatory Visit (HOSPITAL_COMMUNITY)
Admission: RE | Admit: 2023-06-13 | Discharge: 2023-06-13 | Disposition: A | Discharge status: Home / Self Care | Payer: No Typology Code available for payment source | Source: Ambulatory Visit

## 2023-06-13 DIAGNOSIS — R188 Other ascites: Secondary | ICD-10-CM | POA: Insufficient documentation

## 2023-06-13 DIAGNOSIS — R140 Abdominal distension (gaseous): Secondary | ICD-10-CM

## 2023-06-13 HISTORY — PX: IR PARACENTESIS: IMG2679

## 2023-06-13 MED ORDER — LIDOCAINE HCL 1 % IJ SOLN
20.0000 mL | Freq: Once | INTRAMUSCULAR | Status: AC
  Administered 2023-06-13: 10 mL via INTRADERMAL

## 2023-06-13 MED ORDER — LIDOCAINE HCL 1 % IJ SOLN
INTRAMUSCULAR | Status: AC
  Filled 2023-06-13: qty 20

## 2023-06-20 ENCOUNTER — Ambulatory Visit (HOSPITAL_COMMUNITY)
Admission: RE | Admit: 2023-06-20 | Discharge: 2023-06-20 | Disposition: A | Discharge status: Home / Self Care | Payer: No Typology Code available for payment source | Source: Ambulatory Visit

## 2023-06-20 DIAGNOSIS — R140 Abdominal distension (gaseous): Secondary | ICD-10-CM

## 2023-06-20 DIAGNOSIS — R188 Other ascites: Secondary | ICD-10-CM | POA: Diagnosis present

## 2023-06-20 HISTORY — PX: IR PARACENTESIS: IMG2679

## 2023-06-20 MED ORDER — LIDOCAINE HCL 1 % IJ SOLN
INTRAMUSCULAR | Status: AC
  Filled 2023-06-20: qty 20

## 2023-06-20 MED ORDER — LIDOCAINE HCL (PF) 1 % IJ SOLN
20.0000 mL | Freq: Once | INTRAMUSCULAR | Status: AC
  Administered 2023-06-20: 8 mL via INTRADERMAL

## 2023-06-20 NOTE — Procedures (Signed)
PROCEDURE SUMMARY:  Successful US guided paracentesis from right lateral abdomen.  Yielded 2.5 liters of clear yellow fluid.  No immediate complications.  Patient tolerated well.  EBL = trace  Jamacia Jester S Janmichael Giraud PA-C 06/20/2023 9:48 AM

## 2023-06-27 ENCOUNTER — Other Ambulatory Visit (HOSPITAL_COMMUNITY): Payer: Self-pay

## 2023-06-27 ENCOUNTER — Ambulatory Visit (HOSPITAL_COMMUNITY)
Admission: RE | Admit: 2023-06-27 | Discharge: 2023-06-27 | Disposition: A | Discharge status: Home / Self Care | Payer: No Typology Code available for payment source | Source: Ambulatory Visit

## 2023-06-27 ENCOUNTER — Telehealth (HOSPITAL_COMMUNITY): Payer: Self-pay

## 2023-06-27 ENCOUNTER — Telehealth (HOSPITAL_COMMUNITY): Payer: Self-pay | Admitting: Student

## 2023-06-27 DIAGNOSIS — K766 Portal hypertension: Secondary | ICD-10-CM

## 2023-06-27 DIAGNOSIS — Z87898 Personal history of other specified conditions: Secondary | ICD-10-CM

## 2023-06-27 DIAGNOSIS — R140 Abdominal distension (gaseous): Secondary | ICD-10-CM

## 2023-06-27 DIAGNOSIS — R188 Other ascites: Secondary | ICD-10-CM | POA: Diagnosis present

## 2023-06-27 MED ORDER — LIDOCAINE HCL 1 % IJ SOLN
INTRAMUSCULAR | Status: AC
  Filled 2023-06-27: qty 20

## 2023-06-27 NOTE — Telephone Encounter (Signed)
Encounter opened in error

## 2023-06-27 NOTE — Telephone Encounter (Signed)
Called to schedule biopsy w/Dr. Milford Cage, no answer, left vm. AB

## 2023-06-27 NOTE — Telephone Encounter (Signed)
-----   Message from Roanna Banning sent at 06/27/2023 10:16 AM EDT ----- Regarding: RE: TIPS vs Pleurx vs CHF consideration G'morning Morrie Sheldon,  OK to  schedule for Transjugular Liver Biopsy, possible Paracentesis with Me. Pt is not a TIPS candidate given ESRD ib HD. I will communicate this to the referring provider.  Thanks,  Roanna Banning, MD [facility] Radiology  Pager. [phone] Clinic. [phone] ----- Message ----- From: Sharee Pimple Sent: 06/27/2023   9:48 AM EDT To: Ir Procedure Requests Subject: TIPS vs Pleurx vs CHF consideration            Procedure: Request measurement of wedged hepatic pressures to assess if he has portal hypertension. Vet requiring frequent large volume para's. Need objective numbers to tailor therapeutic approach to refractory ascites. (TIPS vs CHF consideration vs pleurx drain if hephrogenic ascites)  Dx: ascites; in this patient; 3 etiologies; liver, cardiac and nephrogenic, need help to determine which one is causing ascites in this dialysis pt  [facility] Delton Coomb[facility] [phone] ext: [phone]  Imaging: None other than frequent para's in epic  Please review.   Thanks,  Fara Boros

## 2023-07-04 ENCOUNTER — Ambulatory Visit (HOSPITAL_COMMUNITY)
Admission: RE | Admit: 2023-07-04 | Discharge: 2023-07-04 | Disposition: A | Discharge status: Home / Self Care | Payer: No Typology Code available for payment source | Source: Ambulatory Visit

## 2023-07-04 DIAGNOSIS — R140 Abdominal distension (gaseous): Secondary | ICD-10-CM

## 2023-07-04 DIAGNOSIS — R188 Other ascites: Secondary | ICD-10-CM | POA: Diagnosis present

## 2023-07-04 HISTORY — PX: IR PARACENTESIS: IMG2679

## 2023-07-04 MED ORDER — LIDOCAINE HCL 1 % IJ SOLN
INTRAMUSCULAR | Status: AC
  Filled 2023-07-04: qty 20

## 2023-07-04 MED ORDER — LIDOCAINE HCL 1 % IJ SOLN
20.0000 mL | Freq: Once | INTRAMUSCULAR | Status: AC
  Administered 2023-07-04: 10 mL via INTRADERMAL

## 2023-07-04 NOTE — Procedures (Signed)
PROCEDURE SUMMARY:  Successful image-guided paracentesis from the right lower abdomen.  Yielded 2.7 liters of clear yellow fluid.  No immediate complications.  EBL = trace. Patient tolerated well.   Specimen was not sent for labs.  Please see imaging section of Epic for full dictation.   Kennieth Francois PA-C 07/04/2023 9:49 AM

## 2023-07-06 ENCOUNTER — Ambulatory Visit (HOSPITAL_COMMUNITY): Payer: No Typology Code available for payment source

## 2023-07-11 ENCOUNTER — Other Ambulatory Visit (HOSPITAL_COMMUNITY): Payer: Self-pay

## 2023-07-11 ENCOUNTER — Ambulatory Visit (HOSPITAL_COMMUNITY)
Admission: RE | Admit: 2023-07-11 | Discharge: 2023-07-11 | Disposition: A | Discharge status: Home / Self Care | Payer: No Typology Code available for payment source | Source: Ambulatory Visit

## 2023-07-11 DIAGNOSIS — R188 Other ascites: Secondary | ICD-10-CM

## 2023-07-11 DIAGNOSIS — R140 Abdominal distension (gaseous): Secondary | ICD-10-CM

## 2023-07-11 HISTORY — PX: IR PARACENTESIS: IMG2679

## 2023-07-11 MED ORDER — LIDOCAINE HCL 1 % IJ SOLN
INTRAMUSCULAR | Status: AC
  Filled 2023-07-11: qty 20

## 2023-07-11 NOTE — Procedures (Signed)
PROCEDURE SUMMARY:  Successful US guided paracentesis from right lateral abdomen.  Yielded 2.8 liters of yellow fluid.  No immediate complications.  Pt tolerated well.   Specimen was sent for labs.  EBL < 5mL   Portal Hypertension Clinic update: Patient scheduled for transjugular liver biopsy with PV pressures 07/18/23.  Hoyt Koch PA-C 07/11/2023 9:42 AM

## 2023-07-17 ENCOUNTER — Other Ambulatory Visit (HOSPITAL_COMMUNITY): Payer: Self-pay | Admitting: Radiology

## 2023-07-17 DIAGNOSIS — N189 Chronic kidney disease, unspecified: Secondary | ICD-10-CM

## 2023-07-18 ENCOUNTER — Encounter (HOSPITAL_COMMUNITY): Payer: Self-pay

## 2023-07-18 ENCOUNTER — Other Ambulatory Visit: Payer: Self-pay

## 2023-07-18 ENCOUNTER — Ambulatory Visit (HOSPITAL_COMMUNITY)
Admission: RE | Admit: 2023-07-18 | Discharge: 2023-07-18 | Disposition: A | Discharge status: Home / Self Care | Payer: No Typology Code available for payment source | Source: Ambulatory Visit

## 2023-07-18 ENCOUNTER — Other Ambulatory Visit (HOSPITAL_COMMUNITY): Payer: Self-pay

## 2023-07-18 ENCOUNTER — Ambulatory Visit (HOSPITAL_COMMUNITY)
Admission: RE | Admit: 2023-07-18 | Discharge: 2023-07-18 | Disposition: A | Discharge status: Home / Self Care | Payer: No Typology Code available for payment source | Source: Ambulatory Visit | Attending: Student | Admitting: Student

## 2023-07-18 DIAGNOSIS — N186 End stage renal disease: Secondary | ICD-10-CM | POA: Insufficient documentation

## 2023-07-18 DIAGNOSIS — Z87891 Personal history of nicotine dependence: Secondary | ICD-10-CM | POA: Insufficient documentation

## 2023-07-18 DIAGNOSIS — K766 Portal hypertension: Secondary | ICD-10-CM | POA: Insufficient documentation

## 2023-07-18 DIAGNOSIS — I132 Hypertensive heart and chronic kidney disease with heart failure and with stage 5 chronic kidney disease, or end stage renal disease: Secondary | ICD-10-CM | POA: Insufficient documentation

## 2023-07-18 DIAGNOSIS — Z87898 Personal history of other specified conditions: Secondary | ICD-10-CM

## 2023-07-18 DIAGNOSIS — R188 Other ascites: Secondary | ICD-10-CM | POA: Insufficient documentation

## 2023-07-18 DIAGNOSIS — I509 Heart failure, unspecified: Secondary | ICD-10-CM | POA: Diagnosis not present

## 2023-07-18 DIAGNOSIS — N189 Chronic kidney disease, unspecified: Secondary | ICD-10-CM

## 2023-07-18 HISTORY — PX: IR TRANSCATHETER BX: IMG713

## 2023-07-18 HISTORY — PX: IR PARACENTESIS: IMG2679

## 2023-07-18 LAB — CBC
HCT: 33.3 % — ABNORMAL LOW (ref 39.0–52.0)
Hemoglobin: 10.7 g/dL — ABNORMAL LOW (ref 13.0–17.0)
MCH: 29.5 pg (ref 26.0–34.0)
MCHC: 32.1 g/dL (ref 30.0–36.0)
MCV: 91.7 fL (ref 80.0–100.0)
Platelets: 190 10*3/uL (ref 150–400)
RBC: 3.63 MIL/uL — ABNORMAL LOW (ref 4.22–5.81)
RDW: 15.1 % (ref 11.5–15.5)
WBC: 4.3 10*3/uL (ref 4.0–10.5)
nRBC: 0 % (ref 0.0–0.2)

## 2023-07-18 LAB — PROTIME-INR
INR: 1 (ref 0.8–1.2)
Prothrombin Time: 13.4 s (ref 11.4–15.2)

## 2023-07-18 MED ORDER — FENTANYL CITRATE (PF) 100 MCG/2ML IJ SOLN
INTRAMUSCULAR | Status: AC
  Filled 2023-07-18: qty 4

## 2023-07-18 MED ORDER — SODIUM CHLORIDE 0.9 % IV SOLN: INTRAVENOUS | Status: DC

## 2023-07-18 MED ORDER — ALBUMIN HUMAN 25 % IV SOLN
INTRAVENOUS | Status: AC
  Filled 2023-07-18: qty 100

## 2023-07-18 MED ORDER — FENTANYL CITRATE (PF) 100 MCG/2ML IJ SOLN
INTRAMUSCULAR | Status: AC | PRN
  Administered 2023-07-18 (×2): 25 ug via INTRAVENOUS

## 2023-07-18 MED ORDER — MIDAZOLAM HCL 2 MG/2ML IJ SOLN
INTRAMUSCULAR | Status: AC | PRN
Start: 2023-07-18 — End: 2023-07-18
  Administered 2023-07-18: .5 mg via INTRAVENOUS

## 2023-07-18 MED ORDER — MIDAZOLAM HCL 2 MG/2ML IJ SOLN
INTRAMUSCULAR | Status: AC
  Filled 2023-07-18: qty 4

## 2023-07-18 MED ORDER — LIDOCAINE HCL 1 % IJ SOLN
20.0000 mL | Freq: Once | INTRAMUSCULAR | Status: AC
  Administered 2023-07-18: 20 mL

## 2023-07-18 MED ORDER — LIDOCAINE HCL 1 % IJ SOLN
INTRAMUSCULAR | Status: AC
  Filled 2023-07-18: qty 20

## 2023-07-18 MED ORDER — IOHEXOL 300 MG/ML  SOLN
50.0000 mL | Freq: Once | INTRAMUSCULAR | Status: AC | PRN
  Administered 2023-07-18: 30 mL via INTRA_ARTERIAL

## 2023-07-18 NOTE — Discharge Instructions (Addendum)
Neck Site Care   This sheet gives you information about how to care for yourself after your procedure. Your health care provider may also give you more specific instructions. If you have problems or questions, contact your health care provider. What can I expect after the procedure? After the procedure, it is common to have: Bruising and tenderness at the catheter insertion area. Follow these instructions at home:  Insertion site care Follow instructions from your health care provider about how to take care of your insertion site. Make sure you: Wash your hands with soap and water before you change your bandage (dressing). If soap and water are not available, use hand sanitizer. Remove your dressing as told by your health care provider. In 24 hours Check your insertion site every day for signs of infection. Check for: Redness, swelling, or pain. Pus or a bad smell. Warmth. You may shower 24-48 hours after the procedure. Do not apply powder or lotion to the site.  Activity For 24 hours after the procedure, or as directed by your health care provider: Do not push or pull heavy objects with the affected arm. Do not drive yourself home from the hospital or clinic. You may drive 24 hours after the procedure unless your health care provider tells you not to. Do not lift anything that is heavier than 10 lb (4.5 kg), or the limit that you are told, until your health care provider says that it is safe.  For 24 hours

## 2023-07-18 NOTE — Procedures (Addendum)
Vascular and Interventional Radiology Procedure Note  Patient: Gabriel Ashley DOB: Apr 16, 1966 Medical Record Number: [id number] Note Date/Time: 07/18/23 2:05 PM   Performing Physician: Roanna Banning, MD Assistant(s): None  Diagnosis: ESRD and refractory ascites   Procedure(s):  TRANSJUGULAR LIVER BIOPSY THERAPEUTIC PARACENTESIS  Anesthesia: Conscious Sedation Complications: None Estimated Blood Loss: Minimal Specimens: Pathology  Findings:  - access via the RIGHT jugular vein. - Successful TJLB w 3 cores obtained - portal HTN with HPVG gradient of 11 mmHg - Therapeutic paracentesis with 1.2 L of serous ascites removed.    Final report to follow once all images are reviewed and compared with previous studies.  See detailed dictation with images in PACS. The patient tolerated the procedure well without incident or complication and was returned to Recovery in stable condition.    Roanna Banning, MD V[facility] and Interventional Radiology Specialists [facility] Radiology   Pager. [phone] Clinic. [phone]

## 2023-07-18 NOTE — Consult Note (Signed)
Chief Complaint: Patient was seen in consultation today for transjugular liver biopsy and paracentesis at the request of Dr Romona Curls   Supervising Physician: Roanna Banning  Patient Status: [facility] - Out-pt  History of Present Illness: Gabriel Ashley is a 57 y.o. male   FULL Code status per pt ESRD Recurrent ascites Pt is followed by [facility] hospital in [place] Unknown etiology for ascites Portal hypertension  Many paracentesis in IR Most recent 10/23: 2.8 L  Scheduled now for Transjugular liver biopsy And Paracentesis  Past Medical History:  Diagnosis Date   Anemia    Anxiety    Asthma    Cannabis use in remisison    CHF (congestive heart failure) (HCC)    Cocaine use    Depression    ED (erectile dysfunction)    Hypertension    Insomnia    Kidney disease    T/Th/Sa   OSA (obstructive sleep apnea)    on CPAP   Pneumonia    Post traumatic stress disorder (PTSD)    Vitamin D deficiency     Past Surgical History:  Procedure Laterality Date   A/V FISTULAGRAM N/A 12/29/2020   Procedure: A/V FISTULAGRAM - Left Upper;  Surgeon: Leonie Douglas, MD;  Location: [facility] INVASIVE CV LAB;  Service: Cardiovascular;  Laterality: N/A;   AV FISTULA PLACEMENT Left 08/18/2020   Procedure: LEFT ARM ARTERIOVENOUS (AV) FISTULA;  Surgeon: Maeola Harman, MD;  Location: [facility] OR;  Service: Vascular;  Laterality: Left;   FISTULA SUPERFICIALIZATION Left 01/10/2021   Procedure: LEFT FISTULA SUPERFICIALIZATION;  Surgeon: Leonie Douglas, MD;  Location: [facility] OR;  Service: Vascular;  Laterality: Left;   IR PARACENTESIS  12/12/2022   IR PARACENTESIS  05/09/2023   IR PARACENTESIS  05/16/2023   IR PARACENTESIS  05/23/2023   IR PARACENTESIS  05/30/2023   IR PARACENTESIS  06/06/2023   IR PARACENTESIS  06/13/2023   IR PARACENTESIS  06/20/2023   IR PARACENTESIS  07/04/2023   IR PARACENTESIS  07/11/2023   left arm surgery     LIGATION OF COMPETING BRANCHES OF ARTERIOVENOUS FISTULA Left 01/10/2021    Procedure: LIGATION OF COMPETING BRANCHES OF LEFT ARM ARTERIOVENOUS FISTULA;  Surgeon: Leonie Douglas, MD;  Location: [facility] OR;  Service: Vascular;  Laterality: Left;    Allergies: Patient has no known allergies.  Medications: Prior to Admission medications   Medication Sig Start Date End Date Taking? Authorizing Provider  albuterol (PROVENTIL HFA;VENTOLIN HFA) 108 (90 Base) MCG/ACT inhaler Inhale 2 puffs into the lungs every 6 (six) hours as needed for wheezing or shortness of breath. 08/05/18   Micheal Likens, MD  amLODipine (NORVASC) 10 MG tablet Take 1 tablet (10 mg total) by mouth daily. 08/05/18   Micheal Likens, MD  carvedilol (COREG) 12.5 MG tablet Take 1 tablet (12.5 mg total) by mouth daily. 08/05/18   Micheal Likens, MD  cloNIDine (CATAPRES) 0.2 MG tablet Take 1 tablet (0.2 mg total) by mouth 2 (two) times daily. Patient taking differently: Take 0.2 mg by mouth 3 (three) times daily. 08/05/18   Micheal Likens, MD  diphenhydrAMINE (BENADRYL) 25 mg capsule Take by mouth. 02/15/21   [provider]  Docusate Sodium (DSS) 100 MG CAPS Take 1 capsule by mouth daily. 03/08/21   [provider]  furosemide (LASIX) 40 MG tablet Take 1 tablet (40 mg total) by mouth daily. 08/05/18   Micheal Likens, MD  hydrALAZINE (APRESOLINE) 100 MG tablet Take 100 mg by  mouth every 8 (eight) hours.    [provider]  hydrocortisone cream 1 % Apply topically. 02/01/21   [provider]  lanthanum (FOSRENOL) 1000 MG chewable tablet Chew by mouth. 03/30/20   [provider]  lidocaine-prilocaine (EMLA) cream Apply topically. 12/28/20   [provider]  Multiple Vitamin (MULTIVITAMIN) tablet Take 1 tablet by mouth daily. 04/30/17   Thermon Leyland, NP  QUEtiapine (SEROQUEL) 300 MG tablet Take 300 mg by mouth at bedtime.    [provider]  sertraline (ZOLOFT) 100 MG tablet Take 1 tablet (100 mg total) by  mouth daily. Patient taking differently: Take 150 mg by mouth daily. 08/06/18   Micheal Likens, MD  sildenafil (VIAGRA) 100 MG tablet Take 100 mg by mouth as needed for erectile dysfunction.  03/13/18   [provider]  Skin Protectants, Misc. (EUCERIN) cream Apply topically. 01/17/21   [provider]  traZODone (DESYREL) 150 MG tablet Take 150 mg by mouth at bedtime.    [provider]     Family History  Problem Relation Age of Onset   Breast cancer Mother    Cancer Father        type unknown    Social History   Socioeconomic History   Marital status: Single    Spouse name: Not on file   Number of children: 1   Years of education: Not on file   Highest education level: Not on file  Occupational History   Not on file  Tobacco Use   Smoking status: Former    Current packs/day: 0.00    Average packs/day: 1 pack/day for 31.0 years (31.0 ttl pk-yrs)    Types: Cigarettes    Start date: 11/03/1986    Quit date: 11/03/2017    Years since quitting: 5.7   Smokeless tobacco: Never  Vaping Use   Vaping status: Never Used  Substance and Sexual Activity   Alcohol use: Not Currently    Comment: 2-3 years ago   Drug use: Yes    Types: Cocaine, Marijuana    Comment: reports been clean > 1 year (note made 08/16/20)   Sexual activity: Yes  Other Topics Concern   Not on file  Social History Narrative   Not on file   Social Determinants of Health   Financial Resource Strain: Not on file  Food Insecurity: Not on file  Transportation Needs: Not on file  Physical Activity: Not on file  Stress: Not on file  Social Connections: Unknown (01/28/2022)   Received from [facility]   Social Network    Social Network: Not on file    Review of Systems: A 12 point ROS discussed and pertinent positives are indicated in the HPI above.  All other systems are negative.  Review of Systems  Constitutional:  Negative for activity change, fatigue  and fever.  Respiratory:  Negative for cough and shortness of breath.   Cardiovascular:  Negative for leg swelling.  Gastrointestinal:  Positive for abdominal distention. Negative for abdominal pain and nausea.  Psychiatric/Behavioral:  Negative for confusion and decreased concentration.     Vital Signs: There were no vitals taken for this visit.    Physical Exam Vitals reviewed.  HENT:     Mouth/Throat:     Mouth: Mucous membranes are moist.  Cardiovascular:     Rate and Rhythm: Normal rate and regular rhythm.     Heart sounds: Normal heart sounds.  Pulmonary:     Effort:  Pulmonary effort is normal.     Breath sounds: Normal breath sounds.  Abdominal:     General: There is distension.     Palpations: Abdomen is soft.     Tenderness: There is no abdominal tenderness.  Musculoskeletal:        General: Normal range of motion.  Skin:    General: Skin is warm.  Neurological:     Mental Status: He is alert and oriented to person, place, and time.  Psychiatric:        Behavior: Behavior normal.     Imaging: IR Paracentesis  Result Date: 07/11/2023 INDICATION: 57 year old male with history of recurrent ascites. Presents for therapeutic paracentesis. EXAM: ULTRASOUND GUIDED THERAPEUTIC PARACENTESIS MEDICATIONS: 10 mL 1% lidocaine COMPLICATIONS: None immediate. PROCEDURE: Informed written consent was obtained from the patient after a discussion of the risks, benefits and alternatives to treatment. A timeout was performed prior to the initiation of the procedure. Initial ultrasound scanning demonstrates a moderate amount of ascites within the right lower abdominal quadrant. The right lower abdomen was prepped and draped in the usual sterile fashion. 1% lidocaine was used for local anesthesia. Following this, a 19 gauge, 7-cm, Yueh catheter was introduced. An ultrasound image was saved for documentation purposes. The paracentesis was performed. The catheter was removed and a dressing  was applied. The patient tolerated the procedure well without immediate post procedural complication. FINDINGS: A total of approximately 2.8 liters of yellow fluid was removed. Samples were sent to the laboratory as requested by the clinical team. IMPRESSION: Successful ultrasound-guided paracentesis yielding 2.8 liters of peritoneal fluid. Performed by: Loyce Dys PA-C Electronically Signed   By: Richarda Overlie M.D.   On: 07/11/2023 15:08   IR Paracentesis  Result Date: 07/04/2023 INDICATION: End-stage renal disease with recurrent ascites. Request for therapeutic paracentesis. EXAM: ULTRASOUND GUIDED  PARACENTESIS MEDICATIONS: 8 cc 1% lidocaine COMPLICATIONS: None immediate. PROCEDURE: Informed written consent was obtained from the patient after a discussion of the risks, benefits and alternatives to treatment. A timeout was performed prior to the initiation of the procedure. Initial ultrasound scanning demonstrates a moderate amount of ascites within the right lower abdominal quadrant. The right lower abdomen was prepped and draped in the usual sterile fashion. 1% lidocaine was used for local anesthesia. Following this, a 19 gauge, 7-cm, Yueh catheter was introduced. An ultrasound image was saved for documentation purposes. The paracentesis was performed. The catheter was removed and a dressing was applied. The patient tolerated the procedure well without immediate post procedural complication. FINDINGS: A total of approximately 2.7 L of clear yellow fluid was removed. Ordering provider did not request laboratory samples. IMPRESSION: Successful ultrasound-guided paracentesis yielding 2.7 liters of peritoneal fluid. Procedure performed by Mina Marble, PA-C Electronically Signed   By: Gilmer Mor D.O.   On: 07/04/2023 10:59   IR ABDOMEN US LIMITED  Result Date: 06/27/2023 CLINICAL DATA:  Patient with a history of end-stage renal disease with recurrent ascites. EXAM: LIMITED ABDOMEN ULTRASOUND FOR  ASCITES TECHNIQUE: Limited ultrasound survey for ascites was performed in all four abdominal quadrants. COMPARISON:  IR Paracentesis 06/20/23. FINDINGS: Limited ultrasound examination of the abdomen revealed scant ascites. Insufficient fluid to safely perform this procedure. No procedure performed. Image findings discussed with the patient. IMPRESSION: Scant ascites, too small to allow for safe ultrasound guided paracentesis. No procedure performed. Ultrasound images obtained by [name] Ren, NP Electronically Signed   By: Simonne Come M.D.   On: 06/27/2023 12:37   IR Paracentesis  Result Date: 06/20/2023 INDICATION: End-stage renal disease with recurrent ascites. Request for therapeutic paracentesis. EXAM: ULTRASOUND GUIDED PARACENTESIS MEDICATIONS: 1% lidocaine 10 mL COMPLICATIONS: None immediate. PROCEDURE: Informed written consent was obtained from the patient after a discussion of the risks, benefits and alternatives to treatment. A timeout was performed prior to the initiation of the procedure. Initial ultrasound scanning demonstrates a large amount of ascites within the right lower abdominal quadrant. The right lower abdomen was prepped and draped in the usual sterile fashion. 1% lidocaine was used for local anesthesia. Following this, a 19 gauge, 7-cm, Yueh catheter was introduced. An ultrasound image was saved for documentation purposes. The paracentesis was performed. The catheter was removed and a dressing was applied. The patient tolerated the procedure well without immediate post procedural complication. FINDINGS: A total of approximately 2.5 L of clear yellow fluid was removed. IMPRESSION: Successful ultrasound-guided paracentesis yielding 2.5 liters of peritoneal fluid. Procedure performed by: Corrin Parker, PA-C Electronically Signed   By: Simonne Come M.D.   On: 06/20/2023 11:18    Labs:  CBC: Recent Labs    12/10/22 1630 12/16/22 1230  WBC 8.3 6.7  HGB 10.3* 9.6*  HCT 33.2* 30.6*  PLT  257 246    COAGS: No results for input(s): "INR", "APTT" in the last 8760 hours.  BMP: Recent Labs    12/10/22 1630 12/16/22 1025  NA 136 134*  K 4.5 4.0  CL 98 95*  CO2 24 24  GLUCOSE 96 127*  BUN 50* 37*  CALCIUM 8.3* 8.0*  CREATININE 9.49* 8.37*  GFRNONAA 6* 7*    LIVER FUNCTION TESTS: Recent Labs    12/16/22 1025  BILITOT 1.0  AST 33  ALT 21  ALKPHOS 135*  PROT 6.7  ALBUMIN 2.8*    TUMOR MARKERS: No results for input(s): "AFPTM", "CEA", "CA199", "CHROMGRNA" in the last 8760 hours.  Assessment and Plan:  Scheduled for TJ Liver biopsy and paracentesis Risks and benefits of Trans jugular liver biopsy was discussed with the patient and/or patient's family including, but not limited to bleeding, infection, damage to adjacent structures or low yield requiring additional tests.  All of the questions were answered and there is agreement to proceed.  Consent signed and in chart.  Thank you for this interesting consult.  I greatly enjoyed meeting Platon Bracher and look forward to participating in their care.  A copy of this report was sent to the requesting provider on this date.  Electronically Signed: Robet Leu, PA-C 07/18/2023, 12:17 PM   I spent a total of    25 Minutes in face to face in clinical consultation, greater than 50% of which was counseling/coordinating care for TJ Liver bx and paracentesis

## 2023-07-20 LAB — SURGICAL PATHOLOGY

## 2023-07-25 ENCOUNTER — Other Ambulatory Visit (HOSPITAL_COMMUNITY): Payer: Self-pay

## 2023-07-25 ENCOUNTER — Ambulatory Visit (HOSPITAL_COMMUNITY)
Admission: RE | Admit: 2023-07-25 | Discharge: 2023-07-25 | Disposition: A | Discharge status: Home / Self Care | Payer: No Typology Code available for payment source | Source: Ambulatory Visit | Attending: Interventional Radiology | Admitting: Interventional Radiology

## 2023-07-25 DIAGNOSIS — R188 Other ascites: Secondary | ICD-10-CM | POA: Insufficient documentation

## 2023-07-25 MED ORDER — LIDOCAINE HCL 1 % IJ SOLN
INTRAMUSCULAR | Status: AC
  Filled 2023-07-25: qty 20

## 2023-07-30 ENCOUNTER — Other Ambulatory Visit (HOSPITAL_COMMUNITY): Payer: Self-pay

## 2023-07-30 DIAGNOSIS — R188 Other ascites: Secondary | ICD-10-CM

## 2023-07-31 ENCOUNTER — Encounter (HOSPITAL_COMMUNITY): Payer: Self-pay

## 2023-07-31 ENCOUNTER — Ambulatory Visit (HOSPITAL_COMMUNITY)
Admission: RE | Admit: 2023-07-31 | Discharge: 2023-07-31 | Disposition: A | Discharge status: Home / Self Care | Payer: No Typology Code available for payment source | Source: Ambulatory Visit

## 2023-07-31 ENCOUNTER — Other Ambulatory Visit (HOSPITAL_COMMUNITY): Payer: No Typology Code available for payment source

## 2023-07-31 DIAGNOSIS — R188 Other ascites: Secondary | ICD-10-CM

## 2023-07-31 HISTORY — PX: IR PARACENTESIS: IMG2679

## 2023-07-31 MED ORDER — LIDOCAINE HCL 1 % IJ SOLN
20.0000 mL | Freq: Once | INTRAMUSCULAR | Status: AC
  Administered 2023-07-31: 8 mL via INTRADERMAL

## 2023-07-31 MED ORDER — LIDOCAINE HCL 1 % IJ SOLN
INTRAMUSCULAR | Status: AC
  Filled 2023-07-31: qty 20

## 2023-08-08 ENCOUNTER — Ambulatory Visit (HOSPITAL_COMMUNITY)
Admission: RE | Admit: 2023-08-08 | Discharge: 2023-08-08 | Disposition: A | Discharge status: Home / Self Care | Payer: No Typology Code available for payment source | Source: Ambulatory Visit

## 2023-08-08 DIAGNOSIS — R188 Other ascites: Secondary | ICD-10-CM | POA: Insufficient documentation

## 2023-08-08 HISTORY — PX: IR PARACENTESIS: IMG2679

## 2023-08-08 MED ORDER — LIDOCAINE HCL 1 % IJ SOLN
INTRAMUSCULAR | Status: AC
Start: 2023-08-08 — End: ?
  Filled 2023-08-08: qty 20

## 2023-08-08 MED ORDER — LIDOCAINE HCL 1 % IJ SOLN
20.0000 mL | Freq: Once | INTRAMUSCULAR | Status: AC
  Administered 2023-08-08: 10 mL

## 2023-08-15 ENCOUNTER — Ambulatory Visit (HOSPITAL_COMMUNITY)
Admission: RE | Admit: 2023-08-15 | Discharge: 2023-08-15 | Disposition: A | Discharge status: Home / Self Care | Payer: No Typology Code available for payment source | Source: Ambulatory Visit

## 2023-08-15 DIAGNOSIS — R188 Other ascites: Secondary | ICD-10-CM | POA: Diagnosis present

## 2023-08-15 HISTORY — PX: IR PARACENTESIS: IMG2679

## 2023-08-15 MED ORDER — LIDOCAINE HCL 1 % IJ SOLN
20.0000 mL | Freq: Once | INTRAMUSCULAR | Status: AC
  Administered 2023-08-15: 10 mL

## 2023-08-15 MED ORDER — LIDOCAINE HCL 1 % IJ SOLN
INTRAMUSCULAR | Status: AC
Start: 2023-08-15 — End: ?
  Filled 2023-08-15: qty 20

## 2023-08-15 MED ORDER — LIDOCAINE HCL 1 % IJ SOLN: 10.0000 mL | Freq: Once | INTRAMUSCULAR | Status: DC

## 2023-08-15 NOTE — Procedures (Signed)
PROCEDURE SUMMARY:  Successful image-guided paracentesis from the right lower abdomen.  Yielded 1.7 liters of hazy amber fluid.  No immediate complications.  EBL = trace. Patient tolerated well.   Specimen was not sent for labs.  Please see imaging section of Epic for full dictation.   Lynann Bologna Amyr Sluder PA-C 08/15/2023 2:01 PM

## 2023-08-22 ENCOUNTER — Ambulatory Visit (HOSPITAL_COMMUNITY)
Admission: RE | Admit: 2023-08-22 | Discharge: 2023-08-22 | Disposition: A | Discharge status: Home / Self Care | Payer: No Typology Code available for payment source | Source: Ambulatory Visit

## 2023-08-22 DIAGNOSIS — R188 Other ascites: Secondary | ICD-10-CM | POA: Insufficient documentation

## 2023-08-22 HISTORY — PX: IR PARACENTESIS: IMG2679

## 2023-08-22 MED ORDER — LIDOCAINE HCL 1 % IJ SOLN
INTRAMUSCULAR | Status: AC
  Filled 2023-08-22: qty 20

## 2023-08-29 ENCOUNTER — Ambulatory Visit (HOSPITAL_COMMUNITY)
Admission: RE | Admit: 2023-08-29 | Discharge: 2023-08-29 | Disposition: A | Discharge status: Home / Self Care | Payer: No Typology Code available for payment source | Source: Ambulatory Visit

## 2023-08-29 DIAGNOSIS — R188 Other ascites: Secondary | ICD-10-CM | POA: Insufficient documentation

## 2023-08-29 HISTORY — PX: IR PARACENTESIS: IMG2679

## 2023-08-29 MED ORDER — LIDOCAINE HCL 1 % IJ SOLN
20.0000 mL | Freq: Once | INTRAMUSCULAR | Status: AC
  Administered 2023-08-29: 10 mL via INTRADERMAL

## 2023-08-29 MED ORDER — LIDOCAINE HCL 1 % IJ SOLN
INTRAMUSCULAR | Status: AC
  Filled 2023-08-29: qty 20

## 2023-08-29 NOTE — Procedures (Signed)
PROCEDURE SUMMARY:  Successful image-guided paracentesis from the right lateral abdomen.  Yielded 2.6 liters of hazy amber fluid.  No immediate complications.  EBL = trace. Patient tolerated well.   Specimen was not sent for labs.  Please see imaging section of Epic for full dictation.   Lynann Bologna Brenleigh Collet PA-C 08/29/2023 11:20 AM

## 2023-09-04 ENCOUNTER — Other Ambulatory Visit (HOSPITAL_COMMUNITY): Payer: Self-pay

## 2023-09-04 DIAGNOSIS — R188 Other ascites: Secondary | ICD-10-CM

## 2023-09-06 ENCOUNTER — Ambulatory Visit (HOSPITAL_COMMUNITY)
Admission: RE | Admit: 2023-09-06 | Discharge: 2023-09-06 | Disposition: A | Discharge status: Home / Self Care | Payer: No Typology Code available for payment source | Source: Ambulatory Visit

## 2023-09-06 DIAGNOSIS — R188 Other ascites: Secondary | ICD-10-CM | POA: Insufficient documentation

## 2023-09-06 HISTORY — PX: IR PARACENTESIS: IMG2679

## 2023-09-06 MED ORDER — LIDOCAINE HCL 1 % IJ SOLN
INTRAMUSCULAR | Status: AC
  Filled 2023-09-06: qty 20

## 2023-09-06 MED ORDER — LIDOCAINE HCL 1 % IJ SOLN
20.0000 mL | Freq: Once | INTRAMUSCULAR | Status: AC
  Administered 2023-09-06: 7 mL via INTRADERMAL

## 2023-09-13 ENCOUNTER — Ambulatory Visit (HOSPITAL_COMMUNITY)
Admission: RE | Admit: 2023-09-13 | Discharge: 2023-09-13 | Disposition: A | Discharge status: Home / Self Care | Payer: No Typology Code available for payment source | Source: Ambulatory Visit

## 2023-09-13 DIAGNOSIS — R188 Other ascites: Secondary | ICD-10-CM | POA: Insufficient documentation

## 2023-09-13 HISTORY — PX: IR PARACENTESIS: IMG2679

## 2023-09-13 MED ORDER — LIDOCAINE HCL 1 % IJ SOLN
20.0000 mL | Freq: Once | INTRAMUSCULAR | Status: AC
  Administered 2023-09-13: 10 mL via INTRADERMAL

## 2023-09-13 MED ORDER — LIDOCAINE HCL 1 % IJ SOLN
INTRAMUSCULAR | Status: AC
  Filled 2023-09-13: qty 20

## 2023-09-13 NOTE — Procedures (Signed)
PROCEDURE SUMMARY:  Successful image-guided paracentesis from the right abdomen.  Yielded 2.3 liters of dark amber peritoneal fluid.  No immediate complications.  EBL: zero Patient tolerated well.   Specimen was not sent for labs.  Please see imaging section of Epic for full dictation.  Caliana Spires A Froilan Mclean PA-C 09/13/2023 1:10 PM

## 2023-09-20 ENCOUNTER — Ambulatory Visit (HOSPITAL_COMMUNITY)
Admission: RE | Admit: 2023-09-20 | Discharge: 2023-09-20 | Disposition: A | Discharge status: Home / Self Care | Payer: No Typology Code available for payment source | Source: Ambulatory Visit

## 2023-09-20 DIAGNOSIS — R188 Other ascites: Secondary | ICD-10-CM | POA: Insufficient documentation

## 2023-09-20 HISTORY — PX: IR PARACENTESIS: IMG2679

## 2023-09-20 NOTE — Procedures (Signed)
 PROCEDURE SUMMARY:  Successful US guided paracentesis from right abdomen.  Yielded 2.1 L of clear yellow fluid.  No immediate complications.  Pt tolerated well.   Specimen not sent for labs.  EBL < 2 mL  Mickie Kay, NP 09/20/2023 3:31 PM

## 2023-09-21 ENCOUNTER — Other Ambulatory Visit (HOSPITAL_COMMUNITY): Payer: Self-pay

## 2023-09-21 DIAGNOSIS — R188 Other ascites: Secondary | ICD-10-CM

## 2023-09-27 ENCOUNTER — Ambulatory Visit (HOSPITAL_COMMUNITY)
Admission: RE | Admit: 2023-09-27 | Discharge: 2023-09-27 | Disposition: A | Discharge status: Home / Self Care | Payer: No Typology Code available for payment source | Source: Ambulatory Visit

## 2023-09-27 DIAGNOSIS — R188 Other ascites: Secondary | ICD-10-CM | POA: Diagnosis present

## 2023-09-27 HISTORY — PX: IR PARACENTESIS: IMG2679

## 2023-09-27 MED ORDER — LIDOCAINE HCL 1 % IJ SOLN
INTRAMUSCULAR | Status: AC
Start: 2023-09-27 — End: ?
  Filled 2023-09-27: qty 20

## 2023-09-27 NOTE — Procedures (Signed)
 PROCEDURE SUMMARY:  Successful US  guided paracentesis from right lateral abdomen.  Yielded 1.6 liters of clear yellow fluid.  No immediate complications.  Patient tolerated well.  EBL = trace  Specimen not sent for labs.  Ravonda Brecheen CHRISTELLA Bal PA-C 09/27/2023 2:19 PM

## 2023-10-05 ENCOUNTER — Ambulatory Visit (HOSPITAL_COMMUNITY)
Admission: RE | Admit: 2023-10-05 | Discharge: 2023-10-05 | Disposition: A | Discharge status: Home / Self Care | Payer: No Typology Code available for payment source | Source: Ambulatory Visit

## 2023-10-05 DIAGNOSIS — R188 Other ascites: Secondary | ICD-10-CM | POA: Diagnosis present

## 2023-10-05 HISTORY — PX: IR PARACENTESIS: IMG2679

## 2023-10-05 MED ORDER — LIDOCAINE HCL 1 % IJ SOLN
INTRAMUSCULAR | Status: AC
  Filled 2023-10-05: qty 20

## 2023-10-05 NOTE — Procedures (Signed)
PROCEDURE SUMMARY:  Successful image-guided paracentesis from the left lower abdomen.  Yielded 2 liters of hazy yellow fluid.  No immediate complications.  EBL = trace. Patient tolerated well.   Specimen was not sent for labs.  Please see imaging section of Epic for full dictation.   Lynann Bologna Jamien Casanova PA-C 10/05/2023 10:06 AM

## 2023-10-12 ENCOUNTER — Ambulatory Visit (HOSPITAL_COMMUNITY)
Admission: RE | Admit: 2023-10-12 | Discharge: 2023-10-12 | Disposition: A | Discharge status: Home / Self Care | Payer: No Typology Code available for payment source | Source: Ambulatory Visit

## 2023-10-12 DIAGNOSIS — R188 Other ascites: Secondary | ICD-10-CM | POA: Diagnosis present

## 2023-10-12 HISTORY — PX: IR PARACENTESIS: IMG2679

## 2023-10-12 MED ORDER — LIDOCAINE HCL 1 % IJ SOLN
INTRAMUSCULAR | Status: AC
  Filled 2023-10-12: qty 20

## 2023-10-12 MED ORDER — LIDOCAINE HCL 1 % IJ SOLN
20.0000 mL | Freq: Once | INTRAMUSCULAR | Status: AC
  Administered 2023-10-12: 10 mL

## 2023-10-12 NOTE — Procedures (Signed)
Ultrasound-guided therapeutic paracentesis performed yielding 1.1 liters of straw colored fluid. No immediate complications. EBL is none.

## 2023-10-19 ENCOUNTER — Ambulatory Visit (HOSPITAL_COMMUNITY)
Admission: RE | Admit: 2023-10-19 | Discharge: 2023-10-19 | Disposition: A | Discharge status: Home / Self Care | Payer: No Typology Code available for payment source | Source: Ambulatory Visit

## 2023-10-19 DIAGNOSIS — R188 Other ascites: Secondary | ICD-10-CM | POA: Diagnosis present

## 2023-10-19 HISTORY — PX: IR PARACENTESIS: IMG2679

## 2023-10-19 MED ORDER — LIDOCAINE HCL 1 % IJ SOLN
INTRAMUSCULAR | Status: AC
  Filled 2023-10-19: qty 20

## 2023-10-19 MED ORDER — LIDOCAINE HCL 1 % IJ SOLN
20.0000 mL | Freq: Once | INTRAMUSCULAR | Status: AC
  Administered 2023-10-19: 10 mL

## 2023-10-19 NOTE — Procedures (Signed)
PROCEDURE SUMMARY:  Successful US guided paracentesis from left abdomen.  Yielded 2.5 L of clear yellow fluid.  No immediate complications.  Pt tolerated well.   Specimen not sent for labs.  EBL < 2 mL  Mickie Kay, NP 10/19/2023 2:14 PM

## 2023-10-26 ENCOUNTER — Ambulatory Visit (HOSPITAL_COMMUNITY)
Admission: RE | Admit: 2023-10-26 | Discharge: 2023-10-26 | Disposition: A | Discharge status: Home / Self Care | Payer: No Typology Code available for payment source | Source: Ambulatory Visit

## 2023-10-26 DIAGNOSIS — R188 Other ascites: Secondary | ICD-10-CM | POA: Diagnosis present

## 2023-10-26 HISTORY — PX: IR PARACENTESIS: IMG2679

## 2023-10-26 MED ORDER — LIDOCAINE HCL 1 % IJ SOLN
20.0000 mL | Freq: Once | INTRAMUSCULAR | Status: AC
  Administered 2023-10-26: 10 mL

## 2023-10-26 MED ORDER — LIDOCAINE HCL 1 % IJ SOLN
INTRAMUSCULAR | Status: AC
  Filled 2023-10-26: qty 20

## 2023-10-26 NOTE — Procedures (Signed)
 PROCEDURE SUMMARY:  Successful US  guided paracentesis from left lateral abdomen.  Yielded 1.4 liters of clear gold fluid.  No immediate complications.  Patient tolerated well.  EBL = trace  Stockton Nunley S Prithvi Kooi PA-C 10/26/2023 2:38 PM

## 2023-10-29 ENCOUNTER — Other Ambulatory Visit (HOSPITAL_COMMUNITY): Payer: Self-pay

## 2023-10-29 DIAGNOSIS — R188 Other ascites: Secondary | ICD-10-CM

## 2023-11-02 ENCOUNTER — Ambulatory Visit (HOSPITAL_COMMUNITY)
Admission: RE | Admit: 2023-11-02 | Discharge: 2023-11-02 | Disposition: A | Discharge status: Home / Self Care | Payer: No Typology Code available for payment source | Source: Ambulatory Visit

## 2023-11-02 ENCOUNTER — Other Ambulatory Visit (HOSPITAL_COMMUNITY): Payer: Self-pay

## 2023-11-02 DIAGNOSIS — R188 Other ascites: Secondary | ICD-10-CM | POA: Diagnosis present

## 2023-11-02 MED ORDER — LIDOCAINE HCL 1 % IJ SOLN
INTRAMUSCULAR | Status: AC
  Filled 2023-11-02: qty 20

## 2023-11-06 ENCOUNTER — Other Ambulatory Visit (HOSPITAL_COMMUNITY): Payer: Self-pay

## 2023-11-06 DIAGNOSIS — R188 Other ascites: Secondary | ICD-10-CM

## 2023-11-07 ENCOUNTER — Ambulatory Visit (HOSPITAL_COMMUNITY)
Admission: RE | Admit: 2023-11-07 | Discharge: 2023-11-07 | Disposition: A | Discharge status: Home / Self Care | Payer: No Typology Code available for payment source | Source: Ambulatory Visit

## 2023-11-07 DIAGNOSIS — R188 Other ascites: Secondary | ICD-10-CM | POA: Insufficient documentation

## 2023-11-07 HISTORY — PX: IR PARACENTESIS: IMG2679

## 2023-11-07 MED ORDER — LIDOCAINE HCL 1 % IJ SOLN
INTRAMUSCULAR | Status: AC
  Filled 2023-11-07: qty 20

## 2023-11-07 MED ORDER — LIDOCAINE HCL 1 % IJ SOLN
10.0000 mL | Freq: Once | INTRAMUSCULAR | Status: AC
  Administered 2023-11-07: 8 mL via INTRADERMAL

## 2023-11-07 NOTE — Procedures (Signed)
 PROCEDURE SUMMARY:  Successful image-guided paracentesis from the left abdomen.  Yielded 1.5 liters of clear, straw-colored peritoneal fluid.  No immediate complications.  EBL: zero Patient tolerated well.   Please see imaging section of Epic for full dictation.  Kenli Waldo A Dewie Ahart PA-C 11/07/2023 2:12 PM

## 2023-11-15 ENCOUNTER — Ambulatory Visit (HOSPITAL_COMMUNITY)
Admission: RE | Admit: 2023-11-15 | Discharge: 2023-11-15 | Disposition: A | Discharge status: Home / Self Care | Payer: No Typology Code available for payment source | Source: Ambulatory Visit

## 2023-11-15 DIAGNOSIS — R188 Other ascites: Secondary | ICD-10-CM | POA: Diagnosis present

## 2023-11-15 HISTORY — PX: IR PARACENTESIS: IMG2679

## 2023-11-15 MED ORDER — LIDOCAINE HCL 1 % IJ SOLN
INTRAMUSCULAR | Status: AC
  Filled 2023-11-15: qty 20

## 2023-11-15 NOTE — Procedures (Signed)
 Ultrasound-guided  therapeutic paracentesis performed yielding 1.8 liters of straw colored fluid.  No immediate complications. EBL is none.

## 2023-11-23 ENCOUNTER — Ambulatory Visit (HOSPITAL_COMMUNITY)
Admission: RE | Admit: 2023-11-23 | Discharge: 2023-11-23 | Disposition: A | Discharge status: Home / Self Care | Source: Ambulatory Visit

## 2023-11-23 ENCOUNTER — Other Ambulatory Visit (HOSPITAL_COMMUNITY): Payer: Self-pay

## 2023-11-23 DIAGNOSIS — R188 Other ascites: Secondary | ICD-10-CM | POA: Diagnosis present

## 2023-11-23 MED ORDER — LIDOCAINE HCL 1 % IJ SOLN
INTRAMUSCULAR | Status: AC
  Filled 2023-11-23: qty 20

## 2023-11-23 NOTE — Procedures (Signed)
 Patient presented to IR today for possible paracentesis. Limited ultrasound examination of the abdomen revealed trace ascites. No procedure performed. Images saved in Epic.  [name] Ren, AGACNP-BC 11/23/2023, 11:37 AM

## 2023-11-27 ENCOUNTER — Other Ambulatory Visit (HOSPITAL_COMMUNITY): Payer: Self-pay

## 2023-11-27 ENCOUNTER — Ambulatory Visit (HOSPITAL_COMMUNITY)
Admission: RE | Admit: 2023-11-27 | Discharge: 2023-11-27 | Disposition: A | Discharge status: Home / Self Care | Source: Ambulatory Visit

## 2023-11-27 DIAGNOSIS — R188 Other ascites: Secondary | ICD-10-CM | POA: Insufficient documentation

## 2023-11-27 NOTE — Progress Notes (Signed)
 Patient presents for therapeutic  paracentesis. [place] limited abdomen shows trace amount of peritoneal fluid noted  Insufficient to perform a safe paracentesis. Procedure not performed.

## 2023-11-30 ENCOUNTER — Other Ambulatory Visit (HOSPITAL_COMMUNITY): Payer: Self-pay

## 2023-11-30 DIAGNOSIS — Z87898 Personal history of other specified conditions: Secondary | ICD-10-CM

## 2023-12-05 ENCOUNTER — Other Ambulatory Visit (HOSPITAL_COMMUNITY): Payer: Self-pay

## 2023-12-05 ENCOUNTER — Ambulatory Visit (HOSPITAL_COMMUNITY)
Admission: RE | Admit: 2023-12-05 | Discharge: 2023-12-05 | Disposition: A | Discharge status: Home / Self Care | Source: Ambulatory Visit

## 2023-12-05 DIAGNOSIS — Z8719 Personal history of other diseases of the digestive system: Secondary | ICD-10-CM | POA: Diagnosis not present

## 2023-12-05 DIAGNOSIS — Z87898 Personal history of other specified conditions: Secondary | ICD-10-CM

## 2023-12-05 DIAGNOSIS — R188 Other ascites: Secondary | ICD-10-CM | POA: Diagnosis present

## 2023-12-05 NOTE — Progress Notes (Signed)
 Interventional Radiology Brief Note:  Patient presents to [facility]iology for possible paracentesis. States he does not feel distended or bloated.  Limited US Abdomen reveals only a trace amount of perihepatic fluid not amenable to therapeutic procedure.  No procedure performed.   Loyce Dys, MS RD PA-C 2:15 PM

## 2023-12-11 ENCOUNTER — Other Ambulatory Visit: Payer: Self-pay

## 2023-12-11 DIAGNOSIS — R188 Other ascites: Secondary | ICD-10-CM

## 2023-12-12 ENCOUNTER — Ambulatory Visit (HOSPITAL_COMMUNITY)
Admission: RE | Admit: 2023-12-12 | Discharge: 2023-12-12 | Disposition: A | Discharge status: Home / Self Care | Source: Ambulatory Visit

## 2023-12-12 ENCOUNTER — Other Ambulatory Visit: Payer: Self-pay

## 2023-12-12 DIAGNOSIS — R188 Other ascites: Secondary | ICD-10-CM | POA: Diagnosis present

## 2023-12-12 NOTE — Procedures (Signed)
 Patient presents for therapeutic and diagnostic paracentesis. [place] limited shows trace amount of peritoneal fluid noted  Insufficient to perform a safe paracentesis. Procedure not performed.  Thank you for allowing our service to participate in Gabriel Ashley 's care.  Electronically Signed: Loman Brooklyn, PA-C   12/12/2023, 12:56 PM

## 2023-12-18 ENCOUNTER — Ambulatory Visit: Admitting: Vascular Surgery

## 2023-12-18 ENCOUNTER — Encounter: Payer: Self-pay | Admitting: Vascular Surgery

## 2023-12-18 ENCOUNTER — Ambulatory Visit (INDEPENDENT_AMBULATORY_CARE_PROVIDER_SITE_OTHER): Admitting: Vascular Surgery

## 2023-12-18 VITALS — BP 131/75 | HR 86 | Temp 99.0°F | Ht 71.0 in | Wt 196.0 lb

## 2023-12-18 DIAGNOSIS — N186 End stage renal disease: Secondary | ICD-10-CM | POA: Diagnosis not present

## 2023-12-18 DIAGNOSIS — Z992 Dependence on renal dialysis: Secondary | ICD-10-CM

## 2023-12-18 NOTE — Progress Notes (Signed)
 [facility] [place] PROGRESS NOTE  ASSESSMENT / PLAN: Gabriel Ashley is a 58 y.o. male with end-stage renal disease, currently dialyzing through a left arm brachiobasilic arteriovenous fistula.  This is working well for him.  Unfortunately he has chronic ascites in his belly which has required paracentesis.  He is not a good candidate for peritoneal dialysis because of this.  I encouraged him to follow-up with his nephrologist to look for a cause for his recurrent ascites.  He can follow-up with me on an as-needed basis.   SUBJECTIVE: Patient returns to clinic to discuss peritoneal dialysis.  I reminded him that, as before, he is not a candidate for peritoneal dialysis because of his persistent ascites and need for periodic paracentesis.  OBJECTIVE: BP 131/75 (BP Location: Right Arm, Patient Position: Sitting, Cuff Size: Normal)   Pulse 86   Temp 99 F (37.2 C)   Ht 5\' 11"  (1.803 m)   Wt 196 lb (88.9 kg)   BMI 27.34 kg/m   No distress Protuberant abdomen with umbilical hernia.  Fluid wave present Left upper extremity AV fistula with smooth thrill     Latest Ref Rng & Units 07/18/2023   12:43 PM 12/16/2022   12:30 PM 12/10/2022    4:30 PM  CBC  WBC 4.0 - 10.5 K/uL 4.3  6.7  8.3   Hemoglobin 13.0 - 17.0 g/dL 62.9  9.6  52.8   Hematocrit 39.0 - 52.0 % 33.3  30.6  33.2   Platelets 150 - 400 K/uL 190  246  257         Latest Ref Rng & Units 12/16/2022   10:25 AM 12/10/2022    4:30 PM 01/10/2021    6:03 AM  CMP  Glucose 70 - 99 mg/dL 413  96  90   BUN 6 - 20 mg/dL 37  50  68   Creatinine 0.61 - 1.24 mg/dL 2.44  0.10  27.25   Sodium 135 - 145 mmol/L 134  136  134   Potassium 3.5 - 5.1 mmol/L 4.0  4.5  5.3   Chloride 98 - 111 mmol/L 95  98  98   CO2 22 - 32 mmol/L 24  24    Calcium 8.9 - 10.3 mg/dL 8.0  8.3    Total Protein 6.5 - 8.1 g/dL 6.7     Total Bilirubin 0.3 - 1.2 mg/dL 1.0     Alkaline Phos 38 - 126 U/L 135     AST 15 - 41 U/L 33     ALT 0 - 44  U/L 21       Rondrick Barreira N. Lenell Antu, MD [facility] Phone Number: [phone] 12/18/2023 4:08 PM

## 2024-03-09 ENCOUNTER — Other Ambulatory Visit: Payer: Self-pay

## 2024-03-09 ENCOUNTER — Emergency Department (HOSPITAL_COMMUNITY)

## 2024-03-09 ENCOUNTER — Observation Stay (HOSPITAL_COMMUNITY)
Admission: EM | Admit: 2024-03-09 | Discharge: 2024-03-10 | Discharge status: Against Medical Advice | Attending: Internal Medicine | Admitting: Internal Medicine

## 2024-03-09 ENCOUNTER — Encounter (HOSPITAL_COMMUNITY): Payer: Self-pay | Admitting: Internal Medicine

## 2024-03-09 DIAGNOSIS — I132 Hypertensive heart and chronic kidney disease with heart failure and with stage 5 chronic kidney disease, or end stage renal disease: Secondary | ICD-10-CM | POA: Insufficient documentation

## 2024-03-09 DIAGNOSIS — J45909 Unspecified asthma, uncomplicated: Secondary | ICD-10-CM | POA: Diagnosis not present

## 2024-03-09 DIAGNOSIS — Z794 Long term (current) use of insulin: Secondary | ICD-10-CM | POA: Insufficient documentation

## 2024-03-09 DIAGNOSIS — F1490 Cocaine use, unspecified, uncomplicated: Secondary | ICD-10-CM | POA: Diagnosis not present

## 2024-03-09 DIAGNOSIS — I10 Essential (primary) hypertension: Secondary | ICD-10-CM | POA: Diagnosis not present

## 2024-03-09 DIAGNOSIS — F1290 Cannabis use, unspecified, uncomplicated: Secondary | ICD-10-CM | POA: Diagnosis not present

## 2024-03-09 DIAGNOSIS — R0602 Shortness of breath: Secondary | ICD-10-CM

## 2024-03-09 DIAGNOSIS — I5042 Chronic combined systolic (congestive) and diastolic (congestive) heart failure: Secondary | ICD-10-CM | POA: Diagnosis not present

## 2024-03-09 DIAGNOSIS — E8770 Fluid overload, unspecified: Secondary | ICD-10-CM | POA: Diagnosis not present

## 2024-03-09 DIAGNOSIS — F332 Major depressive disorder, recurrent severe without psychotic features: Secondary | ICD-10-CM | POA: Diagnosis not present

## 2024-03-09 DIAGNOSIS — E875 Hyperkalemia: Secondary | ICD-10-CM | POA: Diagnosis not present

## 2024-03-09 DIAGNOSIS — N19 Unspecified kidney failure: Secondary | ICD-10-CM

## 2024-03-09 DIAGNOSIS — G4733 Obstructive sleep apnea (adult) (pediatric): Secondary | ICD-10-CM | POA: Diagnosis not present

## 2024-03-09 DIAGNOSIS — F4310 Post-traumatic stress disorder, unspecified: Secondary | ICD-10-CM | POA: Diagnosis not present

## 2024-03-09 DIAGNOSIS — F339 Major depressive disorder, recurrent, unspecified: Secondary | ICD-10-CM | POA: Diagnosis not present

## 2024-03-09 DIAGNOSIS — F1910 Other psychoactive substance abuse, uncomplicated: Secondary | ICD-10-CM | POA: Diagnosis present

## 2024-03-09 DIAGNOSIS — R531 Weakness: Secondary | ICD-10-CM | POA: Diagnosis not present

## 2024-03-09 DIAGNOSIS — R392 Extrarenal uremia: Secondary | ICD-10-CM | POA: Diagnosis not present

## 2024-03-09 DIAGNOSIS — N186 End stage renal disease: Secondary | ICD-10-CM | POA: Insufficient documentation

## 2024-03-09 DIAGNOSIS — F419 Anxiety disorder, unspecified: Secondary | ICD-10-CM | POA: Diagnosis present

## 2024-03-09 DIAGNOSIS — E876 Hypokalemia: Secondary | ICD-10-CM

## 2024-03-09 LAB — BRAIN NATRIURETIC PEPTIDE: B Natriuretic Peptide: 1565 pg/mL — ABNORMAL HIGH (ref 0.0–100.0)

## 2024-03-09 LAB — BASIC METABOLIC PANEL WITH GFR
Anion gap: 27 — ABNORMAL HIGH (ref 5–15)
Anion gap: 29 — ABNORMAL HIGH (ref 5–15)
BUN: 153 mg/dL — ABNORMAL HIGH (ref 6–20)
BUN: 157 mg/dL — ABNORMAL HIGH (ref 6–20)
CO2: 14 mmol/L — ABNORMAL LOW (ref 22–32)
CO2: 13 mmol/L — ABNORMAL LOW (ref 22–32)
Calcium: 6 mg/dL — CL (ref 8.9–10.3)
Calcium: 5.8 mg/dL — CL (ref 8.9–10.3)
Chloride: 94 mmol/L — ABNORMAL LOW (ref 98–111)
Chloride: 94 mmol/L — ABNORMAL LOW (ref 98–111)
Creatinine, Ser: 30.67 mg/dL — ABNORMAL HIGH (ref 0.61–1.24)
Creatinine, Ser: 31.33 mg/dL — ABNORMAL HIGH (ref 0.61–1.24)
GFR, Estimated: 1 mL/min — ABNORMAL LOW (ref >60)
GFR, Estimated: 1 mL/min — ABNORMAL LOW (ref >60)
Glucose, Bld: 107 mg/dL — ABNORMAL HIGH (ref 70–99)
Glucose, Bld: 123 mg/dL — ABNORMAL HIGH (ref 70–99)
Potassium: 6.4 mmol/L (ref 3.5–5.1)
Potassium: 4.8 mmol/L (ref 3.5–5.1)
Sodium: 135 mmol/L (ref 135–145)
Sodium: 136 mmol/L (ref 135–145)

## 2024-03-09 LAB — CBC
HCT: 30.1 % — ABNORMAL LOW (ref 39.0–52.0)
Hemoglobin: 9.7 g/dL — ABNORMAL LOW (ref 13.0–17.0)
MCH: 30.5 pg (ref 26.0–34.0)
MCHC: 32.2 g/dL (ref 30.0–36.0)
MCV: 94.7 fL (ref 80.0–100.0)
Platelets: 137 10*3/uL — ABNORMAL LOW (ref 150–400)
RBC: 3.18 MIL/uL — ABNORMAL LOW (ref 4.22–5.81)
RDW: 14.9 % (ref 11.5–15.5)
WBC: 5.8 10*3/uL (ref 4.0–10.5)
nRBC: 0 % (ref 0.0–0.2)

## 2024-03-09 LAB — HIV ANTIBODY (ROUTINE TESTING W REFLEX): HIV Screen 4th Generation wRfx: NONREACTIVE

## 2024-03-09 LAB — HEPATITIS B SURFACE ANTIGEN: Hepatitis B Surface Ag: NONREACTIVE

## 2024-03-09 LAB — CBG MONITORING, ED: Glucose-Capillary: 122 mg/dL — ABNORMAL HIGH (ref 70–99)

## 2024-03-09 MED ORDER — INSULIN ASPART 100 UNIT/ML IV SOLN
5.0000 [IU] | Freq: Once | INTRAVENOUS | Status: AC
  Administered 2024-03-09: 5 [IU] via INTRAVENOUS

## 2024-03-09 MED ORDER — LIDOCAINE-PRILOCAINE 2.5-2.5 % EX CREA: 1.0000 | TOPICAL_CREAM | CUTANEOUS | Status: DC | PRN

## 2024-03-09 MED ORDER — SODIUM ZIRCONIUM CYCLOSILICATE 10 G PO PACK
10.0000 g | PACK | Freq: Once | ORAL | Status: AC
  Administered 2024-03-09: 10 g via ORAL
  Filled 2024-03-09: qty 1

## 2024-03-09 MED ORDER — HEPARIN SODIUM (PORCINE) 1000 UNIT/ML DIALYSIS: 1000.0000 [IU] | INTRAMUSCULAR | Status: DC | PRN

## 2024-03-09 MED ORDER — CALCIUM GLUCONATE-NACL 1-0.675 GM/50ML-% IV SOLN
1.0000 g | Freq: Once | INTRAVENOUS | Status: AC
  Administered 2024-03-09: 1000 mg via INTRAVENOUS
  Filled 2024-03-09: qty 50

## 2024-03-09 MED ORDER — ALBUTEROL SULFATE (2.5 MG/3ML) 0.083% IN NEBU
10.0000 mg | INHALATION_SOLUTION | Freq: Once | RESPIRATORY_TRACT | Status: AC
  Administered 2024-03-09: 10 mg via RESPIRATORY_TRACT
  Filled 2024-03-09: qty 12

## 2024-03-09 MED ORDER — ALBUTEROL SULFATE (2.5 MG/3ML) 0.083% IN NEBU: 3.0000 mL | INHALATION_SOLUTION | Freq: Four times a day (QID) | RESPIRATORY_TRACT | Status: DC | PRN

## 2024-03-09 MED ORDER — TRAZODONE HCL 50 MG PO TABS: 100.0000 mg | ORAL_TABLET | Freq: Every evening | ORAL | Status: DC | PRN

## 2024-03-09 MED ORDER — ACETAMINOPHEN 325 MG PO TABS: 650.0000 mg | ORAL_TABLET | Freq: Four times a day (QID) | ORAL | Status: DC | PRN

## 2024-03-09 MED ORDER — ACETAMINOPHEN 650 MG RE SUPP: 650.0000 mg | Freq: Four times a day (QID) | RECTAL | Status: DC | PRN

## 2024-03-09 MED ORDER — SODIUM CHLORIDE 0.9% FLUSH
3.0000 mL | Freq: Two times a day (BID) | INTRAVENOUS | Status: DC
  Administered 2024-03-09 (×2): 3 mL via INTRAVENOUS

## 2024-03-09 MED ORDER — LIDOCAINE HCL (PF) 1 % IJ SOLN: 5.0000 mL | INTRAMUSCULAR | Status: DC | PRN

## 2024-03-09 MED ORDER — QUETIAPINE FUMARATE 100 MG PO TABS
300.0000 mg | ORAL_TABLET | Freq: Every day | ORAL | Status: DC
  Administered 2024-03-09: 300 mg via ORAL
  Filled 2024-03-09: qty 3

## 2024-03-09 MED ORDER — ANTICOAGULANT SODIUM CITRATE 4% (200MG/5ML) IV SOLN
5.0000 mL | Status: DC | PRN
  Filled 2024-03-09: qty 5

## 2024-03-09 MED ORDER — HEPARIN SODIUM (PORCINE) 5000 UNIT/ML IJ SOLN
5000.0000 [IU] | Freq: Three times a day (TID) | INTRAMUSCULAR | Status: DC
  Administered 2024-03-09 (×2): 5000 [IU] via SUBCUTANEOUS
  Filled 2024-03-09 (×2): qty 1

## 2024-03-09 MED ORDER — AMLODIPINE BESYLATE 10 MG PO TABS: 10.0000 mg | ORAL_TABLET | Freq: Every day | ORAL | Status: DC

## 2024-03-09 MED ORDER — PENTAFLUOROPROP-TETRAFLUOROETH EX AERO: 1.0000 | INHALATION_SPRAY | CUTANEOUS | Status: DC | PRN

## 2024-03-09 MED ORDER — SERTRALINE HCL 100 MG PO TABS: 100.0000 mg | ORAL_TABLET | Freq: Every day | ORAL | Status: DC

## 2024-03-09 MED ORDER — DEXTROSE 50 % IV SOLN
1.0000 | Freq: Once | INTRAVENOUS | Status: AC
  Administered 2024-03-09: 50 mL via INTRAVENOUS
  Filled 2024-03-09: qty 50

## 2024-03-09 MED ORDER — CHLORHEXIDINE GLUCONATE CLOTH 2 % EX PADS: 6.0000 | MEDICATED_PAD | Freq: Every day | CUTANEOUS | Status: DC

## 2024-03-09 NOTE — ED Notes (Signed)
 Calcium 6.0, potassium called as critical 6.4

## 2024-03-09 NOTE — H&P (Signed)
 History and Physical   Gabriel Ashley FMW:[id number] DOB: 08-29-1966 DOA: 03/09/2024  PCP: [facility]   Patient coming from: Home  Chief Complaint: Shortness of breath  HPI: Gabriel Ashley is a 58 y.o. male with medical history significant of hypertension, ESRD, CHF, anxiety, depression, PTSD, substance use, asthma, OSA, chronic pain presenting with shortness of breath.  Patient reports that he has had shortness of breath for the past couple days.  Patient is ESRD and is no longer make urine.  Had been on HD until 2 weeks ago when he began the process of transitioning to PD and has been undergoing training for this.  He has been going in 8 hours a day Monday through Friday with his last session being this past Friday.  Still has HD access which should function as far as we know.  Denies fevers, chills, chest pain, abdominal pain, constipation, diarrhea, nausea, vomiting.  ED Course: Vital signs in the ED  stable.  Lab workup included BMP which showed potassium 6.4, chloride 94, bicarb 14, gap 27, BUN 153, creatinine 30.67, calcium 6.0.  CBC with hemoglobin stable at 9.7 and platelets 137.  BNP elevated to 1563.  X-ray showed cardiomegaly and central venous congestion.  Patient received insulin, D50, Lokelma, calcium gluconate, albuterol  in the ED.  Nephrologist consulted and plan for dialysis.  Review of Systems: As per HPI otherwise all other systems reviewed and are negative.  Past Medical History:  Diagnosis Date   Anemia    Anxiety    Asthma    Cannabis use in remisison    CHF (congestive heart failure) (HCC)    Cocaine use    Depression    ED (erectile dysfunction)    Hypertension    Hypertensive urgency 04/20/2017   Insomnia    Kidney disease    T/Th/Sa   OSA (obstructive sleep apnea)    on CPAP   Pneumonia    Post traumatic stress disorder (PTSD)    Vitamin D deficiency     Past Surgical History:  Procedure Laterality Date   A/V FISTULAGRAM N/A 12/29/2020    Procedure: A/V FISTULAGRAM - Left Upper;  Surgeon: Magda Debby SAILOR, MD;  [facility];  Service: Cardiovascular;  Laterality: N/A;   AV FISTULA PLACEMENT Left 08/18/2020   Procedure: LEFT ARM ARTERIOVENOUS (AV) FISTULA;  Surgeon: Sheree Penne Bruckner, MD;  Location: [facility] OR;  Service: Vascular;  Laterality: Left;   FISTULA SUPERFICIALIZATION Left 01/10/2021   Procedure: LEFT FISTULA SUPERFICIALIZATION;  Surgeon: Magda Debby SAILOR, MD;  Location: [facility] OR;  Service: Vascular;  Laterality: Left;   IR PARACENTESIS  12/12/2022   IR PARACENTESIS  05/09/2023   IR PARACENTESIS  05/16/2023   IR PARACENTESIS  05/23/2023   IR PARACENTESIS  05/30/2023   IR PARACENTESIS  06/06/2023   IR PARACENTESIS  06/13/2023   IR PARACENTESIS  06/20/2023   IR PARACENTESIS  07/04/2023   IR PARACENTESIS  07/11/2023   IR PARACENTESIS  07/18/2023   IR PARACENTESIS  07/31/2023   IR PARACENTESIS  08/08/2023   IR PARACENTESIS  08/15/2023   IR PARACENTESIS  08/22/2023   IR PARACENTESIS  08/29/2023   IR PARACENTESIS  09/06/2023   IR PARACENTESIS  09/13/2023   IR PARACENTESIS  09/20/2023   IR PARACENTESIS  09/27/2023   IR PARACENTESIS  10/05/2023   IR PARACENTESIS  10/12/2023   IR PARACENTESIS  10/19/2023   IR PARACENTESIS  10/26/2023   IR PARACENTESIS  11/07/2023   IR PARACENTESIS  11/15/2023   IR TRANSCATHETER BX  07/18/2023   left arm surgery     LIGATION OF COMPETING BRANCHES OF ARTERIOVENOUS FISTULA Left 01/10/2021   Procedure: LIGATION OF COMPETING BRANCHES OF LEFT ARM ARTERIOVENOUS FISTULA;  Surgeon: Magda Debby SAILOR, MD;  Location: [facility] OR;  Service: Vascular;  Laterality: Left;    Social History  reports that he quit smoking about 6 years ago. His smoking use included cigarettes. He started smoking about 37 years ago. He has a 31 pack-year smoking history. He has never used smokeless tobacco. He reports that he does not currently use alcohol. He reports current drug use. Drugs: Cocaine and Marijuana.  No Known  Allergies  Family History  Problem Relation Age of Onset   Breast cancer Mother    Cancer Father        type unknown  Reviewed on admission  Prior to Admission medications   Medication Sig Start Date End Date Taking? Authorizing Provider  albuterol  (PROVENTIL  HFA;VENTOLIN  HFA) 108 (90 Base) MCG/ACT inhaler Inhale 2 puffs into the lungs every 6 (six) hours as needed for wheezing or shortness of breath. 08/05/18   Colton Lonni DASEN, MD  amLODipine  (NORVASC ) 10 MG tablet Take 1 tablet (10 mg total) by mouth daily. 08/05/18   Colton Lonni DASEN, MD  carvedilol  (COREG ) 12.5 MG tablet Take 1 tablet (12.5 mg total) by mouth daily. 08/05/18   Colton Lonni DASEN, MD  cloNIDine  (CATAPRES ) 0.2 MG tablet Take 1 tablet (0.2 mg total) by mouth 2 (two) times daily. Patient taking differently: Take 0.2 mg by mouth 3 (three) times daily. 08/05/18   Colton Lonni DASEN, MD  diphenhydrAMINE  (BENADRYL ) 25 mg capsule Take by mouth. 02/15/21   [provider]  Docusate Sodium  (DSS) 100 MG CAPS Take 1 capsule by mouth daily. 03/08/21   [provider]  furosemide  (LASIX ) 40 MG tablet Take 1 tablet (40 mg total) by mouth daily. 08/05/18   Colton Lonni DASEN, MD  hydrALAZINE  (APRESOLINE ) 100 MG tablet Take 100 mg by mouth every 8 (eight) hours.    [provider]  hydrocortisone cream 1 % Apply topically. 02/01/21   [provider]  lanthanum (FOSRENOL) 1000 MG chewable tablet Chew by mouth. 03/30/20   [provider]  lidocaine -prilocaine (EMLA) cream Apply topically. 12/28/20   [provider]  Multiple Vitamin (MULTIVITAMIN) tablet Take 1 tablet by mouth daily. 04/30/17   Nicholaus Leita LABOR, NP  QUEtiapine  (SEROQUEL ) 300 MG tablet Take 300 mg by mouth at bedtime.    [provider]  sertraline  (ZOLOFT ) 100 MG tablet Take 1 tablet (100 mg total) by mouth daily. Patient taking differently: Take 150 mg by mouth daily. 08/06/18    Colton Lonni DASEN, MD  sildenafil (VIAGRA) 100 MG tablet Take 100 mg by mouth as needed for erectile dysfunction.  03/13/18   [provider]  Skin Protectants, Misc. (EUCERIN) cream Apply topically. 01/17/21   [provider]  traZODone  (DESYREL ) 150 MG tablet Take 150 mg by mouth at bedtime.    [provider]    Physical Exam: Vitals:   03/09/24 1031 03/09/24 1038 03/09/24 1303  BP: 133/83  127/83  Pulse: 90  82  Resp: (!) 22  19  Temp: 98.8 F (37.1 C)    SpO2: 95%  95%  Weight:  88.5 kg   Height:  5' 11 (1.803 m)     Physical Exam Constitutional:      General: He is not in acute distress.  Appearance: Normal appearance.  HENT:     Head: Normocephalic and atraumatic.     Mouth/Throat:     Mouth: Mucous membranes are moist.     Pharynx: Oropharynx is clear.   Eyes:     Extraocular Movements: Extraocular movements intact.     Pupils: Pupils are equal, round, and reactive to light.    Cardiovascular:     Rate and Rhythm: Normal rate and regular rhythm.     Pulses: Normal pulses.     Heart sounds: Normal heart sounds.  Pulmonary:     Effort: Pulmonary effort is normal. No respiratory distress.     Breath sounds: Normal breath sounds.  Abdominal:     General: Bowel sounds are normal. There is no distension.     Palpations: Abdomen is soft.     Tenderness: There is no abdominal tenderness.   Musculoskeletal:        General: No swelling or deformity.     Right lower leg: Edema present.     Left lower leg: Edema present.     Comments: Trace edema   Skin:    General: Skin is warm and dry.   Neurological:     General: No focal deficit present.     Mental Status: Mental status is at baseline.    Labs on Admission: I have personally reviewed following labs and imaging studies  CBC: Recent Labs  Lab 03/09/24 1041  WBC 5.8  HGB 9.7*  HCT 30.1*  MCV 94.7  PLT 137*    Basic Metabolic Panel: Recent Labs  Lab  03/09/24 1041  NA 135  K 6.4*  CL 94*  CO2 14*  GLUCOSE 107*  BUN 153*  CREATININE 30.67*  CALCIUM 6.0*    GFR: Estimated Creatinine Clearance: 2.8 mL/min (A) (by C-G formula based on SCr of 30.67 mg/dL (H)).  Liver Function Tests: No results for input(s): AST, ALT, ALKPHOS, BILITOT, PROT, ALBUMIN  in the last 168 hours.  Urine analysis:    Component Value Date/Time   COLORURINE STRAW (A) 12/05/2016 1855   APPEARANCEUR CLEAR 12/05/2016 1855   LABSPEC 1.006 12/05/2016 1855   PHURINE 6.0 12/05/2016 1855   GLUCOSEU NEGATIVE 12/05/2016 1855   HGBUR SMALL (A) 12/05/2016 1855   BILIRUBINUR NEGATIVE 12/05/2016 1855   KETONESUR NEGATIVE 12/05/2016 1855   PROTEINUR 30 (A) 12/05/2016 1855   UROBILINOGEN 0.2 10/22/2014 0759   NITRITE NEGATIVE 12/05/2016 1855   LEUKOCYTESUR NEGATIVE 12/05/2016 1855    Radiological Exams on Admission: DG Chest 2 View Result Date: 03/09/2024 CLINICAL DATA:  Short of breath EXAM: CHEST - 2 VIEW COMPARISON:  None Available. FINDINGS: Cardiomegaly similar prior. Central venous congestion is also similar prior. No focal infiltrate. No pleural fluid. No pneumothorax. No acute osseous abnormality. IMPRESSION: Cardiomegaly and central venous congestion. Electronically Signed   By: Jackquline Boxer M.D.   On: 03/09/2024 11:51    EKG: Independently reviewed.  Sinus rhythm at 87 bpm.  QTc prolonged at 527.  Mild T wave peaking compared to previous.  QRS okay at 94.  Assessment/Plan Principal Problem:   Hyperkalemia Active Problems:   Anxiety   Severe recurrent major depression without psychotic features (HCC)   PTSD (post-traumatic stress disorder)   Substance abuse (HCC)   Essential hypertension   Hyperkalemia Hypocalcemia Volume overload Uremia ESRD > Patient presenting with shortness of breath.  Has been working on transitioning to PD from HD for the past couple weeks.  Has been going in for training Monday through Friday  for about 8  hours with his last session being Friday. > Despite this he has had shortness of breath for the last 2 days and lab workup shows worsening renal function with potassium 6.4, calcium 6.0, BUN 153, bicarb 14, gap 27, creatinine 30.67. > Had some peaking T waves and received insulin, D50, Lokelma, calcium gluconate, albuterol  in the ED. > Nephrology consulted and will see the patient with plans for dialysis. 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  Last echo in our system was in 2018 with EF 40-45%, G1 DD, mildly reduced RV function. >-Using dialysis for volume removal.  Does not make urine. - Monitor on telemetry - HD as above - May benefit from repeat echocardiogram if outside recent echo not found  Hypertension - Continue home amlodipine  - Hold Hydralazine  with low normal to normal BP and going for HD  Anxiety PTSD Depression - Continue home Seroquel , Zoloft  - Continue trazodone   Asthma - Continue home prn albuterol  - [facility], will need confirm  OSA - CPAP  DVT prophylaxis: Heparin  Code Status:   Full Family Communication:  None on admission  Disposition Plan:   Patient is from:  Home  Anticipated DC to:  Home  Anticipated DC date:  1 to 3 days  Anticipated DC barriers: None  Consults called:  Nephrology Admission status:  Observation, telemetry  Severity of Illness: The appropriate patient status for this patient is OBSERVATION. Observation status is judged to be reasonable and necessary in order to provide the required intensity of service to ensure the patient's safety. The patient's presenting symptoms, physical exam findings, and initial radiographic and laboratory data in the context of their medical condition is felt to  place them at decreased risk for further clinical deterioration. Furthermore, it is anticipated that the patient will be medically stable for discharge from the hospital within 2 midnights of admission.    Marsa KATHEE Scurry MD [facility]  How to contact the [facility] Attending or Consulting provider 7A - 7P or covering provider during after hours 7P -7A, for this patient?   Check the care team in [facility] and look for a) attending/consulting [facility] provider listed and b) the [facility] team listed Log into [email or web address] and use [place]'s universal password to access. If you do not have the password, please contact the hospital operator. Locate the [facility] provider you are looking for under [facility] and page to a number that you can be directly reached. If you still have difficulty reaching the provider, please page the [facility] (Director on Call) for the Hospitalists listed on amion for assistance.  03/09/2024, 2:05 PM

## 2024-03-09 NOTE — Consult Note (Addendum)
 [facility] Renal Consultation Note    Indication for Consultation:  Management of ESRD/hemodialysis, anemia, hypertension/volume, and secondary hyperparathyroidism. PCP:  HPI: Gabriel Ashley is a 58 y.o. male with ESRD, HTN, asthma/COPD, OSA who is being admitted with hyperkalemia.  Has been on HD for ~4 years at [place]. Was interested in transitioning to PD. S/p PD cath placement and umbilical hernia repair on 5/16 with Dr. Cara and just recently started PD training and [facility]. Per notes - has done training 6/12, 6/16, 6/17, 6/20. Not clear if missed some days or documented elsewhere. He says maybe I missed a day? He thinks that the training has been going ok. Over the past 2 days, started having arm and leg weakness and dyspnea which prompted his ED visit.  In ED, BP controlled, afebrile and not hypoxic. Labs showed Na 135, K 6.4, CO2 14, BUN 153, Cr 30, Ca 6, BNP 1565, WBC 5.8, Hgb 9.7, Plts 137. EKG without peaked Ts. CXR with ^ vascular congestion. IV Ca Gluc, Lokelma, and albuterol  given in ED.  Seen at tail end of his albuterol  treatment. We discussed the above. No fever, chills, no CP, no abd pain, diarrhea. Reports one episode of vomiting yesterday. He does have a L AVF which is patent.  Past Medical History:  Diagnosis Date   Anemia    Anxiety    Asthma    Cannabis use in remisison    CHF (congestive heart failure) (HCC)    Cocaine use    Depression    ED (erectile dysfunction)    Hypertension    Hypertensive urgency 04/20/2017   Insomnia    Kidney disease    T/Th/Sa   OSA (obstructive sleep apnea)    on CPAP   Pneumonia    Post traumatic stress disorder (PTSD)    Vitamin D deficiency    Past Surgical History:  Procedure Laterality Date   A/V FISTULAGRAM N/A 12/29/2020   Procedure: A/V FISTULAGRAM - Left Upper;  Surgeon: Magda Debby SAILOR, MD;  [facility]: [facility];  Service: Cardiovascular;  Laterality: N/A;   AV FISTULA  PLACEMENT Left 08/18/2020   Procedure: LEFT ARM ARTERIOVENOUS (AV) FISTULA;  Surgeon: Sheree Penne Bruckner, MD;  Location: [facility] OR;  Service: Vascular;  Laterality: Left;   FISTULA SUPERFICIALIZATION Left 01/10/2021   Procedure: LEFT FISTULA SUPERFICIALIZATION;  Surgeon: Magda Debby SAILOR, MD;  Location: [facility] OR;  Service: Vascular;  Laterality: Left;   IR PARACENTESIS  12/12/2022   IR PARACENTESIS  05/09/2023   IR PARACENTESIS  05/16/2023   IR PARACENTESIS  05/23/2023   IR PARACENTESIS  05/30/2023   IR PARACENTESIS  06/06/2023   IR PARACENTESIS  06/13/2023   IR PARACENTESIS  06/20/2023   IR PARACENTESIS  07/04/2023   IR PARACENTESIS  07/11/2023   IR PARACENTESIS  07/18/2023   IR PARACENTESIS  07/31/2023   IR PARACENTESIS  08/08/2023   IR PARACENTESIS  08/15/2023   IR PARACENTESIS  08/22/2023   IR PARACENTESIS  08/29/2023   IR PARACENTESIS  09/06/2023   IR PARACENTESIS  09/13/2023   IR PARACENTESIS  09/20/2023   IR PARACENTESIS  09/27/2023   IR PARACENTESIS  10/05/2023   IR PARACENTESIS  10/12/2023   IR PARACENTESIS  10/19/2023   IR PARACENTESIS  10/26/2023   IR PARACENTESIS  11/07/2023   IR PARACENTESIS  11/15/2023   IR TRANSCATHETER BX  07/18/2023   left arm surgery     LIGATION OF COMPETING BRANCHES OF ARTERIOVENOUS FISTULA Left  01/10/2021   Procedure: LIGATION OF COMPETING BRANCHES OF LEFT ARM ARTERIOVENOUS FISTULA;  Surgeon: Magda Debby SAILOR, MD;  Location: [facility] OR;  Service: Vascular;  Laterality: Left;   Family History  Problem Relation Age of Onset   Breast cancer Mother    Cancer Father        type unknown   Social History:  reports that he quit smoking about 6 years ago. His smoking use included cigarettes. He started smoking about 37 years ago. He has a 31 pack-year smoking history. He has never used smokeless tobacco. He reports that he does not currently use alcohol. He reports current drug use. Drugs: Cocaine and Marijuana.  ROS: As per HPI otherwise negative.  Physical  Exam: Vitals:   03/09/24 1031 03/09/24 1038 03/09/24 1303 03/09/24 1414  BP: 133/83  127/83   Pulse: 90  82   Resp: (!) 22  19   Temp: 98.8 F (37.1 C)   97.6 F (36.4 C)  TempSrc:    Oral  SpO2: 95%  95%   Weight:  88.5 kg    Height:  5' 11 (1.803 m)       General: Well developed, well nourished, in no acute distress, neb mask in place. Head: Normocephalic, atraumatic, sclera non-icteric. Neck: Supple without lymphadenopathy/masses.  Lungs: Wheezing throughout all lung fields Heart: RRR with normal S1, S2. No murmurs, rubs, or gallops appreciated. Abdomen: Soft, slightly distended. PD cath in R mid abd - non-tender, bandage intact. Musculoskeletal:  Strength and tone appear normal for age. Lower extremities: Trace BLE edema, no ischemic changes, no open wounds. Neuro: Alert and oriented X 3. Moves all extremities spontaneously. Psych:  Responds to questions appropriately with a normal affect. Dialysis Access: PD cath and LUE AVF +t/b  No Known Allergies Prior to Admission medications   Medication Sig Start Date End Date Taking? Authorizing Provider  albuterol  (PROVENTIL  HFA;VENTOLIN  HFA) 108 (90 Base) MCG/ACT inhaler Inhale 2 puffs into the lungs every 6 (six) hours as needed for wheezing or shortness of breath. 08/05/18   Colton Lonni DASEN, MD  amLODipine  (NORVASC ) 10 MG tablet Take 1 tablet (10 mg total) by mouth daily. 08/05/18   Colton Lonni DASEN, MD  carvedilol  (COREG ) 12.5 MG tablet Take 1 tablet (12.5 mg total) by mouth daily. 08/05/18   Colton Lonni DASEN, MD  cloNIDine  (CATAPRES ) 0.2 MG tablet Take 1 tablet (0.2 mg total) by mouth 2 (two) times daily. Patient taking differently: Take 0.2 mg by mouth 3 (three) times daily. 08/05/18   Colton Lonni DASEN, MD  diphenhydrAMINE  (BENADRYL ) 25 mg capsule Take by mouth. 02/15/21   [provider]  Docusate Sodium  (DSS) 100 MG CAPS Take 1 capsule by mouth daily. 03/08/21   [provider]   furosemide  (LASIX ) 40 MG tablet Take 1 tablet (40 mg total) by mouth daily. 08/05/18   Colton Lonni DASEN, MD  hydrALAZINE  (APRESOLINE ) 100 MG tablet Take 100 mg by mouth every 8 (eight) hours.    [provider]  hydrocortisone cream 1 % Apply topically. 02/01/21   [provider]  lanthanum (FOSRENOL) 1000 MG chewable tablet Chew by mouth. 03/30/20   [provider]  lidocaine -prilocaine (EMLA) cream Apply topically. 12/28/20   [provider]  Multiple Vitamin (MULTIVITAMIN) tablet Take 1 tablet by mouth daily. 04/30/17   Nicholaus Leita LABOR, NP  QUEtiapine  (SEROQUEL ) 300 MG tablet Take 300 mg by mouth at bedtime.    [provider]  sertraline  (ZOLOFT ) 100 MG tablet  Take 1 tablet (100 mg total) by mouth daily. Patient taking differently: Take 150 mg by mouth daily. 08/06/18   Colton Lonni DASEN, MD  sildenafil (VIAGRA) 100 MG tablet Take 100 mg by mouth as needed for erectile dysfunction.  03/13/18   [provider]  Skin Protectants, Misc. (EUCERIN) cream Apply topically. 01/17/21   [provider]  traZODone  (DESYREL ) 150 MG tablet Take 150 mg by mouth at bedtime.    [provider]   Current Facility-Administered Medications  Medication Dose Route Frequency Provider Last Rate Last Admin   0.9 %  sodium chloride  infusion  250 mL Intravenous PRN Eliza Lonni RAMAN, MD       acetaminophen  (TYLENOL ) tablet 650 mg  650 mg Oral Q6H PRN Seena Marsa NOVAK, MD       Or   acetaminophen  (TYLENOL ) suppository 650 mg  650 mg Rectal Q6H PRN Seena Marsa NOVAK, MD       albuterol  (PROVENTIL ) (2.5 MG/3ML) 0.083% nebulizer solution 3 mL  3 mL Inhalation Q6H PRN Seena Marsa NOVAK, MD       [START ON 03/10/2024] amLODipine  (NORVASC ) tablet 10 mg  10 mg Oral Daily Melvin, Alexander B, MD       heparin  injection 5,000 Units  5,000 Units Subcutaneous Q8H Melvin, Alexander B, MD   5,000 Units at 03/09/24 1435   QUEtiapine   (SEROQUEL ) tablet 300 mg  300 mg Oral QHS Melvin, Alexander B, MD       [START ON 03/10/2024] sertraline  (ZOLOFT ) tablet 100 mg  100 mg Oral Daily Seena Marsa NOVAK, MD       sodium chloride  flush (NS) 0.9 % injection 3 mL  3 mL Intravenous Q12H Eliza Lonni RAMAN, MD       sodium chloride  flush (NS) 0.9 % injection 3 mL  3 mL Intravenous PRN Eliza Lonni RAMAN, MD       sodium chloride  flush (NS) 0.9 % injection 3 mL  3 mL Intravenous Q12H Seena Marsa NOVAK, MD   3 mL at 03/09/24 1438   traZODone  (DESYREL ) tablet 100 mg  100 mg Oral QHS PRN Seena Marsa NOVAK, MD       Current Outpatient Medications  Medication Sig Dispense Refill   albuterol  (PROVENTIL  HFA;VENTOLIN  HFA) 108 (90 Base) MCG/ACT inhaler Inhale 2 puffs into the lungs every 6 (six) hours as needed for wheezing or shortness of breath. 1 Inhaler 0   amLODipine  (NORVASC ) 10 MG tablet Take 1 tablet (10 mg total) by mouth daily. 30 tablet 0   carvedilol  (COREG ) 12.5 MG tablet Take 1 tablet (12.5 mg total) by mouth daily. 30 tablet 0   cloNIDine  (CATAPRES ) 0.2 MG tablet Take 1 tablet (0.2 mg total) by mouth 2 (two) times daily. (Patient taking differently: Take 0.2 mg by mouth 3 (three) times daily.) 60 tablet 0   diphenhydrAMINE  (BENADRYL ) 25 mg capsule Take by mouth.     Docusate Sodium  (DSS) 100 MG CAPS Take 1 capsule by mouth daily.     furosemide  (LASIX ) 40 MG tablet Take 1 tablet (40 mg total) by mouth daily. 30 tablet 0   hydrALAZINE  (APRESOLINE ) 100 MG tablet Take 100 mg by mouth every 8 (eight) hours.     hydrocortisone cream 1 % Apply topically.     lanthanum (FOSRENOL) 1000 MG chewable tablet Chew by mouth.     lidocaine -prilocaine (EMLA) cream Apply topically.     Multiple Vitamin (MULTIVITAMIN) tablet Take 1 tablet by mouth daily.     QUEtiapine  (  SEROQUEL ) 300 MG tablet Take 300 mg by mouth at bedtime.     sertraline  (ZOLOFT ) 100 MG tablet Take 1 tablet (100 mg total) by mouth daily. (Patient taking differently:  Take 150 mg by mouth daily.) 30 tablet 0   sildenafil (VIAGRA) 100 MG tablet Take 100 mg by mouth as needed for erectile dysfunction.   11   Skin Protectants, Misc. (EUCERIN) cream Apply topically.     traZODone  (DESYREL ) 150 MG tablet Take 150 mg by mouth at bedtime.     Labs: Basic Metabolic Panel: Recent Labs  Lab 03/09/24 1041  NA 135  K 6.4*  CL 94*  CO2 14*  GLUCOSE 107*  BUN 153*  CREATININE 30.67*  CALCIUM 6.0*   CBC: Recent Labs  Lab 03/09/24 1041  WBC 5.8  HGB 9.7*  HCT 30.1*  MCV 94.7  PLT 137*   CBG: Recent Labs  Lab 03/09/24 1412  GLUCAP 122*   Studies/Results: DG Chest 2 View Result Date: 03/09/2024 CLINICAL DATA:  Short of breath EXAM: CHEST - 2 VIEW COMPARISON:  None Available. FINDINGS: Cardiomegaly similar prior. Central venous congestion is also similar prior. No focal infiltrate. No pleural fluid. No pneumothorax. No acute osseous abnormality. IMPRESSION: Cardiomegaly and central venous congestion. Electronically Signed   By: Jackquline Boxer M.D.   On: 03/09/2024 11:51   Dialysis Orders:  In PD training at [facility] - appears was doing 3 cycles, 3L last week.  Assessment/Plan:  ESRD:  Constellation of high K, low CO2, high BUN, low Ca, pulm edema - consistent with inadequate dialysis. Expected to a degree with transitioning to PD and training. Will need more of a PD Rx. In mean time, to faster correct the above, will do a HEMOdialysis tonight. If lab abnormalities better tomorrow, seems feasible to go back to PD on Tuesday with more exchanges. Will need to touch base with his PD RN tomorrow about status and if there are serious concerns - from what I can see, seems like he is not picking up up as fast as expected, so the training has been extended. Hyperkalemia: Already given temporizing measures in ED, HD tonight to correct.  Hypertension/volume: BP ok but pulm edema present, 3L UFG tonight.  Anemia: Hgb 9.7 - will need to clarify ESA dose tomorrow.   Metabolic bone disease: Ca low, see above. Will improve with HD.  Asthma/COPD  OSA   Izetta Boehringer, PA-C 03/09/2024, 2:59 PM  [facility]

## 2024-03-09 NOTE — ED Notes (Signed)
 Pt to dialysis.

## 2024-03-09 NOTE — ED Provider Notes (Signed)
 [place] EMERGENCY DEPARTMENT AT [facility] Provider Note   CSN: [id number] Arrival date & time: 03/09/24  1027     Patient presents with: Shortness of Breath and Weakness   Gabriel Ashley is a 58 y.o. male.    Shortness of Breath Weakness Associated symptoms: shortness of breath   Patient presents for shortness of breath.  Medical history includes asthma, CHF, depression, anxiety, polysubstance abuse, HTN, ESRD.  He is anuric.  He was undergoing hemodialysis up until 2 weeks ago.  His hemodialysis access is left upper arm fistula which still functional as far as he knows.  For the past 2 weeks, he has been undergoing training for peritoneal dialysis.  He undergoes this Monday through Friday.  Sessions are 8 to 10 hours long.  His last session was on Friday.  Over the past 2 days, he has had shortness of breath, fatigue, generalized weakness.  He denies any physical pain.    Prior to Admission medications   Medication Sig Start Date End Date Taking? Authorizing Provider  albuterol  (PROVENTIL  HFA;VENTOLIN  HFA) 108 (90 Base) MCG/ACT inhaler Inhale 2 puffs into the lungs every 6 (six) hours as needed for wheezing or shortness of breath. 08/05/18   Colton Lonni DASEN, MD  amLODipine  (NORVASC ) 10 MG tablet Take 1 tablet (10 mg total) by mouth daily. 08/05/18   Colton Lonni DASEN, MD  carvedilol  (COREG ) 12.5 MG tablet Take 1 tablet (12.5 mg total) by mouth daily. 08/05/18   Colton Lonni DASEN, MD  cloNIDine  (CATAPRES ) 0.2 MG tablet Take 1 tablet (0.2 mg total) by mouth 2 (two) times daily. Patient taking differently: Take 0.2 mg by mouth 3 (three) times daily. 08/05/18   Colton Lonni DASEN, MD  diphenhydrAMINE  (BENADRYL ) 25 mg capsule Take by mouth. 02/15/21   [provider]  Docusate Sodium  (DSS) 100 MG CAPS Take 1 capsule by mouth daily. 03/08/21   [provider]  furosemide  (LASIX ) 40 MG tablet Take 1 tablet (40 mg total) by mouth daily.  08/05/18   Colton Lonni DASEN, MD  hydrALAZINE  (APRESOLINE ) 100 MG tablet Take 100 mg by mouth every 8 (eight) hours.    [provider]  hydrocortisone cream 1 % Apply topically. 02/01/21   [provider]  lanthanum (FOSRENOL) 1000 MG chewable tablet Chew by mouth. 03/30/20   [provider]  lidocaine -prilocaine (EMLA) cream Apply topically. 12/28/20   [provider]  Multiple Vitamin (MULTIVITAMIN) tablet Take 1 tablet by mouth daily. 04/30/17   Nicholaus Leita LABOR, NP  QUEtiapine  (SEROQUEL ) 300 MG tablet Take 300 mg by mouth at bedtime.    [provider]  sertraline  (ZOLOFT ) 100 MG tablet Take 1 tablet (100 mg total) by mouth daily. Patient taking differently: Take 150 mg by mouth daily. 08/06/18   Colton Lonni DASEN, MD  sildenafil (VIAGRA) 100 MG tablet Take 100 mg by mouth as needed for erectile dysfunction.  03/13/18   [provider]  Skin Protectants, Misc. (EUCERIN) cream Apply topically. 01/17/21   [provider]  traZODone  (DESYREL ) 150 MG tablet Take 150 mg by mouth at bedtime.    [provider]    Allergies: Patient has no known allergies.    Review of Systems  Constitutional:  Positive for fatigue.  Respiratory:  Positive for shortness of breath.   Neurological:  Positive for weakness.  All other systems reviewed and are negative.   Updated Vital Signs BP (!) 146/90   Pulse 85   Temp 97.7 F (  36.5 C) (Oral)   Resp 18   Ht 5' 11 (1.803 m)   Wt 88.5 kg   SpO2 95%   BMI 27.20 kg/m   Physical Exam Vitals and nursing note reviewed.  Constitutional:      General: He is not in acute distress.    Appearance: He is well-developed. He is not ill-appearing, toxic-appearing or diaphoretic.  HENT:     Head: Normocephalic and atraumatic.     Mouth/Throat:     Mouth: Mucous membranes are moist.   Eyes:     Conjunctiva/sclera: Conjunctivae normal.    Cardiovascular:     Rate and Rhythm:  Normal rate and regular rhythm.     Heart sounds: No murmur heard. Pulmonary:     Effort: Tachypnea present. No respiratory distress.     Breath sounds: Decreased air movement present.  Chest:     Chest wall: No tenderness.  Abdominal:     Palpations: Abdomen is soft.     Tenderness: There is no abdominal tenderness.   Musculoskeletal:        General: No swelling.     Cervical back: Normal range of motion and neck supple.     Right lower leg: Edema present.     Left lower leg: Edema present.   Skin:    General: Skin is warm and dry.     Coloration: Skin is not cyanotic or pale.   Neurological:     General: No focal deficit present.     Mental Status: He is alert and oriented to person, place, and time.   Psychiatric:        Mood and Affect: Mood normal.        Behavior: Behavior normal.     (all labs ordered are listed, but only abnormal results are displayed) Labs Reviewed  BASIC METABOLIC PANEL WITH GFR - Abnormal; Notable for the following components:      Result Value   Potassium 6.4 (*)    Chloride 94 (*)    CO2 14 (*)    Glucose, Bld 107 (*)    BUN 153 (*)    Creatinine, Ser 30.67 (*)    Calcium 6.0 (*)    GFR, Estimated 1 (*)    Anion gap 27 (*)    All other components within normal limits  CBC - Abnormal; Notable for the following components:   RBC 3.18 (*)    Hemoglobin 9.7 (*)    HCT 30.1 (*)    Platelets 137 (*)    All other components within normal limits  BRAIN NATRIURETIC PEPTIDE - Abnormal; Notable for the following components:   B Natriuretic Peptide 1,565.0 (*)    All other components within normal limits  BASIC METABOLIC PANEL WITH GFR - Abnormal; Notable for the following components:   Chloride 94 (*)    CO2 13 (*)    Glucose, Bld 123 (*)    BUN 157 (*)    Creatinine, Ser 31.33 (*)    Calcium 5.8 (*)    GFR, Estimated 1 (*)    Anion gap 29 (*)    All other components within normal limits  CBG MONITORING, ED - Abnormal; Notable for the  following components:   Glucose-Capillary 122 (*)    All other components within normal limits  HIV ANTIBODY (ROUTINE TESTING W REFLEX)  HEPATITIS B SURFACE ANTIGEN  HEPATITIS B SURFACE ANTIBODY, QUANTITATIVE  COMPREHENSIVE METABOLIC PANEL WITH GFR  CBC    EKG: EKG Interpretation Date/Time:  Sunday March 09 2024 13:03:23 EDT Ventricular Rate:  81 PR Interval:  178 QRS Duration:  116 QT Interval:  461 QTC Calculation: 536 R Axis:   89  Text Interpretation: Sinus rhythm Nonspecific intraventricular conduction delay Confirmed by Melvenia Motto [phone]) on 03/09/2024 1:06:22 PM  Radiology: DG Chest 2 View Result Date: 03/09/2024 CLINICAL DATA:  Short of breath EXAM: CHEST - 2 VIEW COMPARISON:  None Available. FINDINGS: Cardiomegaly similar prior. Central venous congestion is also similar prior. No focal infiltrate. No pleural fluid. No pneumothorax. No acute osseous abnormality. IMPRESSION: Cardiomegaly and central venous congestion. Electronically Signed   By: Jackquline Boxer M.D.   On: 03/09/2024 11:51     Procedures   Medications Ordered in the ED  amLODipine  (NORVASC ) tablet 10 mg (has no administration in time range)  albuterol  (PROVENTIL ) (2.5 MG/3ML) 0.083% nebulizer solution 3 mL (has no administration in time range)  heparin  injection 5,000 Units (5,000 Units Subcutaneous Given 03/09/24 1435)  sodium chloride  flush (NS) 0.9 % injection 3 mL (3 mLs Intravenous Given 03/09/24 1438)  acetaminophen  (TYLENOL ) tablet 650 mg (has no administration in time range)    Or  acetaminophen  (TYLENOL ) suppository 650 mg (has no administration in time range)  sertraline  (ZOLOFT ) tablet 100 mg (has no administration in time range)  QUEtiapine  (SEROQUEL ) tablet 300 mg (has no administration in time range)  traZODone  (DESYREL ) tablet 100 mg (has no administration in time range)  Chlorhexidine  Gluconate Cloth 2 % PADS 6 each (has no administration in time range)  pentafluoroprop-tetrafluoroeth  (GEBAUERS) aerosol 1 Application (has no administration in time range)  lidocaine  (PF) (XYLOCAINE ) 1 % injection 5 mL (has no administration in time range)  lidocaine -prilocaine (EMLA) cream 1 Application (has no administration in time range)  heparin  injection 1,000 Units (has no administration in time range)  anticoagulant sodium citrate solution 5 mL (has no administration in time range)  insulin aspart (novoLOG) injection 5 Units (5 Units Intravenous Given 03/09/24 1318)    And  dextrose  50 % solution 50 mL (50 mLs Intravenous Given 03/09/24 1319)  sodium zirconium cyclosilicate (LOKELMA) packet 10 g (10 g Oral Given 03/09/24 1318)  albuterol  (PROVENTIL ) (2.5 MG/3ML) 0.083% nebulizer solution 10 mg (10 mg Nebulization Given 03/09/24 1312)  calcium gluconate 1 g/ 50 mL sodium chloride  IVPB (0 mg Intravenous Stopped 03/09/24 1357)                                    Medical Decision Making Amount and/or Complexity of Data Reviewed Labs: ordered. Radiology: ordered.  Risk OTC drugs. Prescription drug management. Decision regarding hospitalization.   This patient presents to the ED for concern of shortness of breath, this involves an extensive number of treatment options, and is a complaint that carries with it a high risk of complications and morbidity.  The differential diagnosis includes volume overload, pneumonia, reactive airway disease, anemia, acidosis   Co morbidities / Chronic conditions that complicate the patient evaluation  asthma, CHF, depression, anxiety, polysubstance abuse, HTN, ESRD   Additional history obtained:  Additional history obtained from EMR External records from outside source obtained and reviewed including N/A   Lab Tests:  I Ordered, and personally interpreted labs.  The pertinent results include: Markedly elevated creatinine, BUN, BNP.  Metabolic acidosis is present.  Hyperkalemia is present.   Imaging Studies ordered:  I ordered imaging  studies including chest x-ray I independently visualized and interpreted  imaging which showed cardiomegaly and central venous vascular congestion consistent with CHF exacerbation/volume overload I agree with the radiologist interpretation   Cardiac Monitoring: / EKG:  The patient was maintained on a cardiac monitor.  I personally viewed and interpreted the cardiac monitored which showed an underlying rhythm of: Sinus rhythm   Problem List / ED Course / Critical interventions / Medication management  Patient presenting for shortness of breath, fatigue, generalized weakness.  Prior to being bedded in the ED, workup was initiated.  His initial vital signs were notable for tachypnea.  Lab work is notable for increase in creatinine and BUN from baseline in addition to hyperkalemia.  His BNP is markedly elevated.  On exam, patient has increased work of breathing.  Current SpO2 is normal on room air.  He has diminished breath sounds on lung auscultation.  EKG shows slightly peaked T waves.  Temporizing medications were ordered for his hyperkalemia.  Nephrology was consulted.  I spoke with nephrologist, Dr. Prescilla, who will arrange dialysis.  Patient was admitted for further management. I ordered medication including albuterol , insulin/dextrose , Lokelma, calcium gluconate for temporization of hyperkalemia Reevaluation of the patient after these medicines showed that the patient improved I have reviewed the patients home medicines and have made adjustments as needed   Consultations Obtained:  I requested consultation with the nephrologist, Dr. Prescilla,  and discussed lab and imaging findings as well as pertinent plan - they recommend: Will arrange dialysis   Social Determinants of Health:  Lives independently  CRITICAL CARE Performed by: Bernardino Fireman   Total critical care time: 34 minutes  Critical care time was exclusive of separately billable procedures and treating other  patients.  Critical care was necessary to treat or prevent imminent or life-threatening deterioration.  Critical care was time spent personally by me on the following activities: development of treatment plan with patient and/or surrogate as well as nursing, discussions with consultants, evaluation of patient's response to treatment, examination of patient, obtaining history from patient or surrogate, ordering and performing treatments and interventions, ordering and review of laboratory studies, ordering and review of radiographic studies, pulse oximetry and re-evaluation of patient's condition.       Final diagnoses:  Generalized weakness  Shortness of breath  Hypervolemia, unspecified hypervolemia type  Hyperkalemia    ED Discharge Orders     None          Fireman Bernardino, MD 03/09/24 2055

## 2024-03-09 NOTE — ED Provider Triage Note (Signed)
 Emergency Medicine Provider Triage Evaluation Note  Gabriel Ashley , a 58 y.o. male  was evaluated in triage.  Pt complains of increasing shortness of breath with nonproductive cough over the last 24 hours.  It is notable this patient has CKD and is undergoing peritoneal dialysis.  Also has a history of COPD and of asthma.  Denies any lower extremity edema present.  Does endorse occasional chest discomfort associated with cough.  Review of Systems  Positive: Chest discomfort, shortness of breath, nonproductive cough, fatigue Negative: Nausea/vomiting  Physical Exam  BP 133/83 (BP Location: Right Arm)   Pulse 90   Temp 98.8 F (37.1 C)   Resp (!) 22   Ht 5' 11 (1.803 m)   Wt 88.5 kg   SpO2 95%   BMI 27.20 kg/m  Gen:   Awake, no distress   Resp:  Normal effort, good air entry to all lung fields, expiratory wheeze noted on the right upper and lower fields. MSK:   Moves extremities without difficulty  Other:  No appreciable peripheral edema noted.  Medical Decision Making  Medically screening exam initiated at 11:42 AM.  Appropriate orders placed.  Gabriel Ashley was informed that the remainder of the evaluation will be completed by another provider, this initial triage assessment does not replace that evaluation, and the importance of remaining in the ED until their evaluation is complete.  Based on evaluation, believe this patient is.  There asthma/COPD exacerbation, further differential of fluid volume overload, CHF exacerbation.  Lab workup and imaging ordered for same.   Gabriel Ashley Gabriel Ashley, Gabriel Ashley 03/09/24 1145

## 2024-03-09 NOTE — ED Triage Notes (Signed)
 Pt. Stated, Jesus been SOB for the last 2 days. Im a dialysis pt. I think my last was Friday and completed treatment . Im feeling weak especially on my left arm it feels like its numb.  Pt. Doing training for peritoneal dialysis.

## 2024-03-10 DIAGNOSIS — N186 End stage renal disease: Secondary | ICD-10-CM | POA: Diagnosis not present

## 2024-03-10 DIAGNOSIS — Z992 Dependence on renal dialysis: Secondary | ICD-10-CM

## 2024-03-10 DIAGNOSIS — E875 Hyperkalemia: Secondary | ICD-10-CM | POA: Diagnosis not present

## 2024-03-10 MED ORDER — ORAL CARE MOUTH RINSE: 15.0000 mL | OROMUCOSAL | Status: DC | PRN

## 2024-03-10 NOTE — Progress Notes (Signed)
 Patient called this RN to his room.  He is insistent on leaving.  I expressed my concerns and tried to convince him to stay at least till we got AM labs to check if renal function improved.  He refused.  PIV taken out per patient's request.  He took his telemetry box off.  He dressed himself.  He did sign AMA paperwork.  Dr. Lawence made aware.  Patient walked himself off the unit after getting directions to the ED.  He states he drove himself.  He did state he would get in touch with peritoneal dialysis nurse today.  All personal belongings are with the patient.  Suzen Ice RN

## 2024-03-10 NOTE — Discharge Summary (Signed)
 PATIENT LEFT HOSPITAL AMA OVERNIGHT    Physician Discharge Summary   Patient: Gabriel Ashley MRN: [id number] DOB: 1966-03-30  Admit date:     03/09/2024  Discharge date: 03/10/24  Discharge Physician: Marsa KATHEE Scurry   PCP: [facility]   Recommendations at discharge:   Patient's primary recommendation is to not a hospital If he must leave AMA, then he needs close follow-up with PCP, nephrology.  Repeat labs as soon as possible.  Discharge Diagnoses: Principal Problem:   Hyperkalemia Active Problems:   Anxiety   Severe recurrent major depression without psychotic features (HCC)   PTSD (post-traumatic stress disorder)   Substance abuse (HCC)   Essential hypertension  Resolved Problems:   MDD (major depressive disorder), recurrent episode [facility])  Hospital Course: No notes on file Patient with ESRD on HD until 2 weeks prior appointment started the process of training for peritoneal dialysis.  Presented with abnormal labs and volume overload despite adherence to peritoneal dialysis.  Vital signs in the ED stable. Lab workup included BMP which showed potassium 6.4, chloride 94, bicarb 14, gap 27, BUN 153, creatinine 30.67, calcium 6.0. CBC with hemoglobin stable at 9.7 and platelets 137. BNP elevated to 1563. X-ray showed cardiomegaly and central venous congestion.   Patient was to undergo HD and get repeat labs to see if further treatment is needed or not.  He left AMA in the early hours of the morning.  This was before repeat could be obtained.  Concern that he may not be a good candidate for peritoneal dialysis with his laboratory results remains.   Consultants: Nephrology Procedures performed: HD Disposition: Left AMA Diet recommendation:  Renal diet DISCHARGE MEDICATION: Allergies as of 03/10/2024   No Known Allergies      Medication List     ASK your doctor about these medications    albuterol  108 (90 Base) MCG/ACT inhaler Commonly known as:  VENTOLIN  HFA Inhale 2 puffs into the lungs every 6 (six) hours as needed for wheezing or shortness of breath.   amLODipine  10 MG tablet Commonly known as: NORVASC  Take 1 tablet (10 mg total) by mouth daily.   carvedilol  12.5 MG tablet Commonly known as: COREG  Take 1 tablet (12.5 mg total) by mouth daily.   cloNIDine  0.2 MG tablet Commonly known as: CATAPRES  Take 1 tablet (0.2 mg total) by mouth 2 (two) times daily.   diphenhydrAMINE  25 mg capsule Commonly known as: BENADRYL  Take by mouth.   DSS 100 MG Caps Take 1 capsule by mouth daily.   eucerin cream Apply topically.   furosemide  40 MG tablet Commonly known as: LASIX  Take 1 tablet (40 mg total) by mouth daily.   hydrALAZINE  100 MG tablet Commonly known as: APRESOLINE  Take 100 mg by mouth every 8 (eight) hours.   hydrocortisone cream 1 % Apply topically.   lanthanum 1000 MG chewable tablet Commonly known as: FOSRENOL Chew by mouth.   lidocaine -prilocaine cream Commonly known as: EMLA Apply topically.   multivitamin tablet Take 1 tablet by mouth daily.   QUEtiapine  300 MG tablet Commonly known as: SEROQUEL  Take 300 mg by mouth at bedtime.   sertraline  100 MG tablet Commonly known as: ZOLOFT  Take 1 tablet (100 mg total) by mouth daily.   sildenafil 100 MG tablet Commonly known as: VIAGRA Take 100 mg by mouth as needed for erectile dysfunction.   traZODone  150 MG tablet Commonly known as: DESYREL  Take 150 mg by mouth at bedtime.        Discharge Exam:  Filed Weights   03/09/24 1038 03/10/24 0338  Weight: 88.5 kg 88.2 kg   Left AMA, not evaluated at the time of discharge  Condition at discharge: AMA, not evaluated at the time of discharge  The results of significant diagnostics from this hospitalization (including imaging, microbiology, ancillary and laboratory) are listed below for reference.   Imaging Studies: DG Chest 2 View Result Date: 03/09/2024 CLINICAL DATA:  Short of breath EXAM:  CHEST - 2 VIEW COMPARISON:  None Available. FINDINGS: Cardiomegaly similar prior. Central venous congestion is also similar prior. No focal infiltrate. No pleural fluid. No pneumothorax. No acute osseous abnormality. IMPRESSION: Cardiomegaly and central venous congestion. Electronically Signed   By: Jackquline Boxer M.D.   On: 03/09/2024 11:51    Microbiology: Results for orders placed or performed during the hospital encounter of 12/12/22  Gram stain     Status: None   Collection Time: 12/12/22  3:50 PM   Specimen: Abdomen; Peritoneal Fluid  Result Value Ref Range Status   Specimen Description FLUID PERITONEAL ABDOMEN  Final   Special Requests NONE  Final   Gram Stain   Final    NO WBC SEEN NO ORGANISMS SEEN Performed at [facility], [address], [place], [place] [address]    Report Status 12/12/2022 FINAL  Final    Labs: CBC: Recent Labs  Lab 03/09/24 1041  WBC 5.8  HGB 9.7*  HCT 30.1*  MCV 94.7  PLT 137*   Basic Metabolic Panel: Recent Labs  Lab 03/09/24 1041 03/09/24 1627  NA 135 136  K 6.4* 4.8  CL 94* 94*  CO2 14* 13*  GLUCOSE 107* 123*  BUN 153* 157*  CREATININE 30.67* 31.33*  CALCIUM 6.0* 5.8*   Liver Function Tests: No results for input(s): AST, ALT, ALKPHOS, BILITOT, PROT, ALBUMIN  in the last 168 hours. CBG: Recent Labs  Lab 03/09/24 1412  GLUCAP 122*    Discharge time spent: less than 30 minutes.  Signed: Marsa KATHEE Scurry, MD [facility] 03/10/2024

## 2024-03-10 NOTE — Progress Notes (Signed)
 Received patient in bed to unit.  Alert and oriented.  Informed consent signed and in chart.   TX duration: 3 hours  Patient tolerated well.  Transported back to the room  Alert, without acute distress.  Hand-off given to patient's nurse.   Access used: fistula Access issues: none  Total UF removed: 3000 ml Medication(s) given: none Post HD VS: 142/79 Post HD weight: on stretcher.    03/10/24 0250  Vitals  Temp 97.8 F (36.6 C)  Temp Source Oral  BP (!) 142/79  MAP (mmHg) 90  BP Location Right Arm  BP Method Automatic  Patient Position (if appropriate) Lying  Pulse Rate 78  Pulse Rate Source Monitor  ECG Heart Rate 81  Resp 15  Oxygen Therapy  SpO2 94 %  O2 Device Room Air  Patient Activity (if Appropriate) In bed  Pulse Oximetry Type Continuous  During Treatment Monitoring  Blood Flow Rate (mL/min) 0 mL/min  Arterial Pressure (mmHg) -0.8 mmHg  Venous Pressure (mmHg) -1.41 mmHg  TMP (mmHg) -51.51 mmHg  Ultrafiltration Rate (mL/min) 1179 mL/min  Dialysate Flow Rate (mL/min) 0 ml/min  Duration of HD Treatment -hour(s) 3 hour(s)  Cumulative Fluid Removed (mL) per Treatment  3000.2  Fistula / Graft Left Forearm Arteriovenous fistula  Placement Date/Time: 08/18/20 1200   Orientation: Left  Access Location: Forearm  Access Type: Arteriovenous fistula  Site Condition No complications  Fistula / Graft Assessment Present;Thrill;Bruit;Aneurysm present  Status Deaccessed;Accessed  Drainage Description None      [facility]

## 2024-03-10 NOTE — Progress Notes (Signed)
 New Admission Note:   Arrival Method: Via Stretcher from Hemodialysis Mental Orientation:  A & O  to person/place - refuses to answer further orientation questions Telemetry: Box 5M08 Assessment: Completed Skin:  Skin dry but flaky IV:  Right FA NSL Pain: Denies Tubes:  Peritoneal Dialysis Catheter Safety Measures: Unable to discuss with patient - He is sleep and wants to be left alone.  Bed Alarm activated for safefy Admission: Unable to currently complete admission history because patient does not want to answer questions 5 MW Orientation: Patient has been orientated to the room, unit and staff.  Family:  None at bedside  Clothing at bedside.  Patient is very drowsy and irritable.  He states Why are you asking me all of these questions?  He wants to be left alone.  Unable to complete assessment.  Orders have been reviewed and implemented. Will continue to monitor the patient. Call light has been placed within reach and bed alarm has been activated.   Suzen Ice RN Phone number: [phone]

## 2024-03-11 LAB — HEPATITIS B SURFACE ANTIBODY, QUANTITATIVE: Hep B S AB Quant (Post): 54.3 m[IU]/mL

## 2024-10-24 ENCOUNTER — Other Ambulatory Visit (HOSPITAL_COMMUNITY): Payer: Self-pay | Admitting: Nephrology

## 2024-10-24 DIAGNOSIS — Z87891 Personal history of nicotine dependence: Secondary | ICD-10-CM

## 2024-10-24 DIAGNOSIS — Z7682 Awaiting organ transplant status: Secondary | ICD-10-CM

## 2024-11-05 ENCOUNTER — Ambulatory Visit (HOSPITAL_COMMUNITY)
